# Patient Record
Sex: Female | Born: 1960 | Race: Black or African American | Hispanic: No | Smoking: Never smoker
Health system: Southern US, Community
[De-identification: ages and names within clinical notes are randomized; demographics above are authoritative.]

## PROBLEM LIST (undated history)

## (undated) DIAGNOSIS — E785 Hyperlipidemia, unspecified: Secondary | ICD-10-CM

## (undated) DIAGNOSIS — I480 Paroxysmal atrial fibrillation: Secondary | ICD-10-CM

## (undated) DIAGNOSIS — I10 Essential (primary) hypertension: Secondary | ICD-10-CM

## (undated) DIAGNOSIS — D219 Benign neoplasm of connective and other soft tissue, unspecified: Secondary | ICD-10-CM

## (undated) DIAGNOSIS — D649 Anemia, unspecified: Secondary | ICD-10-CM

## (undated) DIAGNOSIS — I499 Cardiac arrhythmia, unspecified: Secondary | ICD-10-CM

## (undated) HISTORY — DX: Paroxysmal atrial fibrillation: I48.0

## (undated) HISTORY — PX: MYOMECTOMY: SHX85

## (undated) HISTORY — PX: BREAST EXCISIONAL BIOPSY: SUR124

## (undated) HISTORY — DX: Anemia, unspecified: D64.9

---

## 1997-09-13 ENCOUNTER — Emergency Department (HOSPITAL_COMMUNITY): Admission: EM | Admit: 1997-09-13 | Discharge: 1997-09-13 | Payer: Self-pay | Admitting: Emergency Medicine

## 2000-09-02 ENCOUNTER — Other Ambulatory Visit: Admission: RE | Admit: 2000-09-02 | Discharge: 2000-09-02 | Payer: Self-pay | Admitting: Family Medicine

## 2000-09-02 ENCOUNTER — Encounter: Payer: Self-pay | Admitting: General Surgery

## 2000-09-02 ENCOUNTER — Ambulatory Visit (HOSPITAL_COMMUNITY): Admission: RE | Admit: 2000-09-02 | Discharge: 2000-09-02 | Payer: Self-pay | Admitting: Pediatrics

## 2000-09-08 ENCOUNTER — Encounter: Payer: Self-pay | Admitting: General Surgery

## 2000-09-08 ENCOUNTER — Ambulatory Visit (HOSPITAL_COMMUNITY): Admission: RE | Admit: 2000-09-08 | Discharge: 2000-09-08 | Payer: Self-pay | Admitting: Pediatrics

## 2001-05-25 ENCOUNTER — Encounter: Payer: Self-pay | Admitting: Family Medicine

## 2001-05-25 ENCOUNTER — Ambulatory Visit (HOSPITAL_COMMUNITY): Admission: RE | Admit: 2001-05-25 | Discharge: 2001-05-25 | Payer: Self-pay | Admitting: Family Medicine

## 2001-09-12 ENCOUNTER — Encounter: Payer: Self-pay | Admitting: General Surgery

## 2001-09-12 ENCOUNTER — Ambulatory Visit (HOSPITAL_COMMUNITY): Admission: RE | Admit: 2001-09-12 | Discharge: 2001-09-12 | Payer: Self-pay | Admitting: General Surgery

## 2002-04-29 ENCOUNTER — Emergency Department (HOSPITAL_COMMUNITY): Admission: EM | Admit: 2002-04-29 | Discharge: 2002-04-29 | Payer: Self-pay | Admitting: Emergency Medicine

## 2002-06-02 ENCOUNTER — Encounter: Payer: Self-pay | Admitting: Family Medicine

## 2002-06-02 ENCOUNTER — Ambulatory Visit (HOSPITAL_COMMUNITY): Admission: RE | Admit: 2002-06-02 | Discharge: 2002-06-02 | Payer: Self-pay | Admitting: Family Medicine

## 2002-06-07 ENCOUNTER — Ambulatory Visit (HOSPITAL_COMMUNITY): Admission: RE | Admit: 2002-06-07 | Discharge: 2002-06-07 | Payer: Self-pay | Admitting: Internal Medicine

## 2002-06-25 ENCOUNTER — Emergency Department (HOSPITAL_COMMUNITY): Admission: EM | Admit: 2002-06-25 | Discharge: 2002-06-25 | Payer: Self-pay | Admitting: Emergency Medicine

## 2002-10-18 ENCOUNTER — Encounter: Payer: Self-pay | Admitting: General Surgery

## 2002-10-18 ENCOUNTER — Ambulatory Visit (HOSPITAL_COMMUNITY): Admission: RE | Admit: 2002-10-18 | Discharge: 2002-10-18 | Payer: Self-pay | Admitting: General Surgery

## 2002-11-03 ENCOUNTER — Encounter: Payer: Self-pay | Admitting: General Surgery

## 2002-11-03 ENCOUNTER — Ambulatory Visit (HOSPITAL_COMMUNITY): Admission: RE | Admit: 2002-11-03 | Discharge: 2002-11-03 | Payer: Self-pay | Admitting: General Surgery

## 2002-12-05 ENCOUNTER — Encounter: Payer: Self-pay | Admitting: Family Medicine

## 2002-12-05 ENCOUNTER — Ambulatory Visit (HOSPITAL_COMMUNITY): Admission: RE | Admit: 2002-12-05 | Discharge: 2002-12-05 | Payer: Self-pay | Admitting: Family Medicine

## 2004-02-20 ENCOUNTER — Ambulatory Visit: Payer: Self-pay | Admitting: Family Medicine

## 2004-02-22 ENCOUNTER — Ambulatory Visit (HOSPITAL_COMMUNITY): Admission: RE | Admit: 2004-02-22 | Discharge: 2004-02-22 | Payer: Self-pay | Admitting: General Surgery

## 2005-02-24 ENCOUNTER — Ambulatory Visit (HOSPITAL_COMMUNITY): Admission: RE | Admit: 2005-02-24 | Discharge: 2005-02-24 | Payer: Self-pay | Admitting: General Surgery

## 2006-01-18 ENCOUNTER — Other Ambulatory Visit: Admission: RE | Admit: 2006-01-18 | Discharge: 2006-01-18 | Payer: Self-pay | Admitting: Family Medicine

## 2006-01-18 ENCOUNTER — Ambulatory Visit: Payer: Self-pay | Admitting: Family Medicine

## 2006-03-08 ENCOUNTER — Ambulatory Visit (HOSPITAL_COMMUNITY): Admission: RE | Admit: 2006-03-08 | Discharge: 2006-03-08 | Payer: Self-pay | Admitting: General Surgery

## 2006-04-14 ENCOUNTER — Ambulatory Visit (HOSPITAL_COMMUNITY): Admission: RE | Admit: 2006-04-14 | Discharge: 2006-04-14 | Payer: Self-pay | Admitting: General Surgery

## 2006-04-14 LAB — HM MAMMOGRAPHY: HM Mammogram: ABNORMAL

## 2006-05-21 ENCOUNTER — Encounter: Payer: Self-pay | Admitting: Family Medicine

## 2006-05-21 LAB — CONVERTED CEMR LAB
Eosinophils Absolute: 0.1 10*3/uL (ref 0.0–0.7)
Ferritin: 8 ng/mL — ABNORMAL LOW (ref 10–291)
Folate: >20 ng/mL
HCT: 38.2 % (ref 36.0–46.0)
Hemoglobin: 12.1 g/dL (ref 12.0–15.0)
Lymphs Abs: 1.8 10*3/uL (ref 0.7–3.3)
MCHC: 31.7 g/dL (ref 30.0–36.0)
MCV: 68.3 fL — ABNORMAL LOW (ref 78.0–100.0)
Monocytes Relative: 8 % (ref 3–11)
Retic Count, Absolute: 50.3 (ref 19.0–186.0)
Retic Ct Pct: 0.9 % (ref 0.4–3.1)
Vitamin B-12: 373 pg/mL (ref 211–911)
WBC: 6.6 10*3/uL (ref 4.0–10.5)

## 2007-01-25 ENCOUNTER — Ambulatory Visit: Payer: Self-pay | Admitting: Family Medicine

## 2007-01-25 ENCOUNTER — Encounter: Payer: Self-pay | Admitting: Family Medicine

## 2007-01-25 ENCOUNTER — Other Ambulatory Visit: Admission: RE | Admit: 2007-01-25 | Discharge: 2007-01-25 | Payer: Self-pay | Admitting: Family Medicine

## 2007-01-27 ENCOUNTER — Ambulatory Visit (HOSPITAL_COMMUNITY): Admission: RE | Admit: 2007-01-27 | Discharge: 2007-01-27 | Payer: Self-pay | Admitting: Family Medicine

## 2007-01-29 ENCOUNTER — Encounter: Payer: Self-pay | Admitting: Family Medicine

## 2007-01-29 LAB — CONVERTED CEMR LAB: Retic Ct Pct: 0.6 %

## 2007-05-23 ENCOUNTER — Ambulatory Visit (HOSPITAL_COMMUNITY): Admission: RE | Admit: 2007-05-23 | Discharge: 2007-05-23 | Payer: Self-pay | Admitting: General Surgery

## 2008-02-02 ENCOUNTER — Ambulatory Visit: Payer: Self-pay | Admitting: Family Medicine

## 2008-02-02 ENCOUNTER — Other Ambulatory Visit: Admission: RE | Admit: 2008-02-02 | Discharge: 2008-02-02 | Payer: Self-pay | Admitting: Family Medicine

## 2008-02-02 ENCOUNTER — Encounter: Payer: Self-pay | Admitting: Family Medicine

## 2008-02-02 ENCOUNTER — Telehealth (INDEPENDENT_AMBULATORY_CARE_PROVIDER_SITE_OTHER): Payer: Self-pay | Admitting: *Deleted

## 2008-02-02 DIAGNOSIS — D509 Iron deficiency anemia, unspecified: Secondary | ICD-10-CM

## 2008-02-02 DIAGNOSIS — E785 Hyperlipidemia, unspecified: Secondary | ICD-10-CM

## 2008-02-02 LAB — CONVERTED CEMR LAB: OCCULT 1: NEGATIVE

## 2008-02-04 ENCOUNTER — Encounter: Payer: Self-pay | Admitting: Family Medicine

## 2008-02-06 ENCOUNTER — Telehealth: Payer: Self-pay | Admitting: Family Medicine

## 2008-02-06 LAB — CONVERTED CEMR LAB: Retic Ct Pct: 1.2 % (ref 0.4–3.1)

## 2008-02-10 ENCOUNTER — Encounter: Payer: Self-pay | Admitting: Family Medicine

## 2008-03-02 ENCOUNTER — Ambulatory Visit (HOSPITAL_COMMUNITY): Admission: RE | Admit: 2008-03-02 | Discharge: 2008-03-02 | Payer: Self-pay | Admitting: Family Medicine

## 2008-03-02 ENCOUNTER — Ambulatory Visit: Payer: Self-pay | Admitting: Family Medicine

## 2008-03-02 DIAGNOSIS — N920 Excessive and frequent menstruation with regular cycle: Secondary | ICD-10-CM

## 2008-03-02 DIAGNOSIS — M79609 Pain in unspecified limb: Secondary | ICD-10-CM

## 2008-03-02 DIAGNOSIS — N939 Abnormal uterine and vaginal bleeding, unspecified: Secondary | ICD-10-CM

## 2008-03-02 DIAGNOSIS — N926 Irregular menstruation, unspecified: Secondary | ICD-10-CM | POA: Insufficient documentation

## 2008-03-02 DIAGNOSIS — R51 Headache: Secondary | ICD-10-CM

## 2008-03-02 DIAGNOSIS — R519 Headache, unspecified: Secondary | ICD-10-CM | POA: Insufficient documentation

## 2008-03-05 DIAGNOSIS — R05 Cough: Secondary | ICD-10-CM

## 2008-03-05 DIAGNOSIS — F329 Major depressive disorder, single episode, unspecified: Secondary | ICD-10-CM

## 2008-03-07 ENCOUNTER — Encounter (HOSPITAL_COMMUNITY): Admission: RE | Admit: 2008-03-07 | Discharge: 2008-04-06 | Payer: Self-pay | Admitting: Oncology

## 2008-03-07 ENCOUNTER — Encounter: Payer: Self-pay | Admitting: Family Medicine

## 2008-03-07 ENCOUNTER — Ambulatory Visit (HOSPITAL_COMMUNITY): Payer: Self-pay | Admitting: Oncology

## 2008-03-12 ENCOUNTER — Ambulatory Visit (HOSPITAL_COMMUNITY): Admission: RE | Admit: 2008-03-12 | Discharge: 2008-03-12 | Payer: Self-pay | Admitting: Family Medicine

## 2008-03-26 ENCOUNTER — Encounter: Payer: Self-pay | Admitting: Family Medicine

## 2008-06-06 ENCOUNTER — Ambulatory Visit: Payer: Self-pay | Admitting: Family Medicine

## 2008-06-29 ENCOUNTER — Ambulatory Visit (HOSPITAL_COMMUNITY): Admission: RE | Admit: 2008-06-29 | Discharge: 2008-06-29 | Payer: Self-pay | Admitting: General Surgery

## 2009-03-04 ENCOUNTER — Ambulatory Visit: Payer: Self-pay | Admitting: Family Medicine

## 2009-03-04 ENCOUNTER — Other Ambulatory Visit: Admission: RE | Admit: 2009-03-04 | Discharge: 2009-03-04 | Payer: Self-pay | Admitting: Family Medicine

## 2009-03-04 DIAGNOSIS — D259 Leiomyoma of uterus, unspecified: Secondary | ICD-10-CM | POA: Insufficient documentation

## 2009-03-04 HISTORY — DX: Leiomyoma of uterus, unspecified: D25.9

## 2009-03-05 ENCOUNTER — Encounter: Payer: Self-pay | Admitting: Family Medicine

## 2009-03-05 LAB — CONVERTED CEMR LAB
Basophils Absolute: 0 10*3/uL (ref 0.0–0.1)
Calcium: 9.4 mg/dL (ref 8.4–10.5)
Cholesterol: 264 mg/dL — ABNORMAL HIGH (ref 0–200)
Glucose, Bld: 87 mg/dL (ref 70–99)
HDL: 76 mg/dL (ref >39)
Hemoglobin: 11.2 g/dL — ABNORMAL LOW (ref 12.0–15.0)
LDL Cholesterol: 171 mg/dL — ABNORMAL HIGH (ref 0–99)
Lymphocytes Relative: 31 % (ref 12–46)
Lymphs Abs: 2.1 10*3/uL (ref 0.7–4.0)
MCV: 67.1 fL — ABNORMAL LOW (ref 78.0–100.0)
Monocytes Relative: 6 % (ref 3–12)
Neutro Abs: 4.3 10*3/uL (ref 1.7–7.7)
Potassium: 3.9 meq/L (ref 3.5–5.3)
RDW: 15.7 % — ABNORMAL HIGH (ref 11.5–15.5)
Sodium: 142 meq/L (ref 135–145)
VLDL: 17 mg/dL (ref 0–40)
WBC: 6.9 10*3/uL (ref 4.0–10.5)

## 2009-03-07 ENCOUNTER — Encounter: Payer: Self-pay | Admitting: Family Medicine

## 2009-03-09 ENCOUNTER — Encounter: Payer: Self-pay | Admitting: Family Medicine

## 2009-03-09 LAB — CONVERTED CEMR LAB: OCCULT 3: NEGATIVE

## 2009-03-12 ENCOUNTER — Encounter: Payer: Self-pay | Admitting: Family Medicine

## 2009-03-12 LAB — CONVERTED CEMR LAB: OCCULT 2: NEGATIVE

## 2009-03-13 ENCOUNTER — Encounter: Payer: Self-pay | Admitting: Family Medicine

## 2009-05-02 ENCOUNTER — Ambulatory Visit (HOSPITAL_COMMUNITY): Admission: RE | Admit: 2009-05-02 | Discharge: 2009-05-02 | Payer: Self-pay | Admitting: Family Medicine

## 2009-05-02 ENCOUNTER — Ambulatory Visit: Payer: Self-pay | Admitting: Family Medicine

## 2009-05-02 DIAGNOSIS — IMO0002 Reserved for concepts with insufficient information to code with codable children: Secondary | ICD-10-CM

## 2009-05-06 ENCOUNTER — Encounter: Payer: Self-pay | Admitting: Family Medicine

## 2009-05-08 ENCOUNTER — Telehealth: Payer: Self-pay | Admitting: Family Medicine

## 2009-07-05 ENCOUNTER — Ambulatory Visit (HOSPITAL_COMMUNITY): Admission: RE | Admit: 2009-07-05 | Discharge: 2009-07-05 | Payer: Self-pay | Admitting: General Surgery

## 2010-02-13 ENCOUNTER — Telehealth: Payer: Self-pay | Admitting: Family Medicine

## 2010-03-12 ENCOUNTER — Encounter: Payer: Self-pay | Admitting: Family Medicine

## 2010-03-12 ENCOUNTER — Telehealth: Payer: Self-pay | Admitting: Family Medicine

## 2010-03-12 ENCOUNTER — Ambulatory Visit: Payer: Self-pay | Admitting: Family Medicine

## 2010-03-14 ENCOUNTER — Encounter: Payer: Self-pay | Admitting: Family Medicine

## 2010-03-14 ENCOUNTER — Telehealth: Payer: Self-pay | Admitting: Family Medicine

## 2010-03-17 ENCOUNTER — Encounter: Payer: Self-pay | Admitting: Family Medicine

## 2010-03-17 LAB — CONVERTED CEMR LAB
Albumin: 4.7 g/dL (ref 3.5–5.2)
Alkaline Phosphatase: 71 units/L (ref 39–117)
CO2: 31 meq/L (ref 19–32)
Calcium: 10 mg/dL (ref 8.4–10.5)
Cholesterol: 278 mg/dL — ABNORMAL HIGH (ref 0–200)
HCT: 35.8 % — ABNORMAL LOW (ref 36.0–46.0)
HDL: 81 mg/dL (ref >39)
Indirect Bilirubin: 0.4 mg/dL (ref 0.0–0.9)
Iron: 94 ug/dL (ref 42–145)
LDL Cholesterol: 187 mg/dL — ABNORMAL HIGH (ref 0–99)
Lymphocytes Relative: 29 % (ref 12–46)
MCV: 65.6 fL — ABNORMAL LOW (ref 78.0–100.0)
Neutrophils Relative %: 65 % (ref 43–77)
Platelets: 323 10*3/uL (ref 150–400)
Potassium: 4.2 meq/L (ref 3.5–5.3)
RBC: 5.46 M/uL — ABNORMAL HIGH (ref 3.87–5.11)
RDW: 15.8 % — ABNORMAL HIGH (ref 11.5–15.5)
Sodium: 140 meq/L (ref 135–145)
Total Protein: 7.9 g/dL (ref 6.0–8.3)
VLDL: 10 mg/dL (ref 0–40)
WBC: 8 10*3/uL (ref 4.0–10.5)

## 2010-03-26 ENCOUNTER — Ambulatory Visit: Payer: Self-pay | Admitting: Family Medicine

## 2010-03-26 ENCOUNTER — Other Ambulatory Visit
Admission: RE | Admit: 2010-03-26 | Discharge: 2010-03-26 | Payer: Self-pay | Source: Home / Self Care | Admitting: Family Medicine

## 2010-03-27 LAB — HM PAP SMEAR

## 2010-03-27 LAB — HM MAMMOGRAPHY

## 2010-04-07 ENCOUNTER — Encounter: Payer: Self-pay | Admitting: Family Medicine

## 2010-04-08 ENCOUNTER — Encounter: Payer: Self-pay | Admitting: Family Medicine

## 2010-04-10 ENCOUNTER — Encounter: Payer: Self-pay | Admitting: Family Medicine

## 2010-04-13 ENCOUNTER — Encounter: Payer: Self-pay | Admitting: Family Medicine

## 2010-04-13 LAB — CONVERTED CEMR LAB: OCCULT 2: NEGATIVE

## 2010-04-15 ENCOUNTER — Encounter: Payer: Self-pay | Admitting: Family Medicine

## 2010-04-23 ENCOUNTER — Ambulatory Visit: Admit: 2010-04-23 | Payer: Self-pay | Admitting: Internal Medicine

## 2010-04-23 ENCOUNTER — Ambulatory Visit (HOSPITAL_COMMUNITY)
Admission: RE | Admit: 2010-04-23 | Discharge: 2010-04-23 | Payer: Self-pay | Source: Home / Self Care | Attending: Internal Medicine | Admitting: Internal Medicine

## 2010-04-29 NOTE — Letter (Signed)
Summary: Letter  Letter   Imported By: Lind Guest 05/09/2009 13:44:01  _____________________________________________________________________  External Attachment:    Type:   Image     Comment:   External Document

## 2010-04-29 NOTE — Progress Notes (Signed)
Summary: gas dr  Phone Note Call from Patient   Summary of Call:  patient that wants the refe. to a gast. dr  Initial call taken by: Lind Guest,  February 13, 2010 1:53 PM  Follow-up for Phone Call        pls call document her gI concerns so i can refer Follow-up by: Syliva Overman MD,  February 13, 2010 3:36 PM  Additional Follow-up for Phone Call Additional follow up Details #1::        needs gi referal for colonoscopy  Additional Follow-up by: Adella Hare LPN,  February 13, 2010 4:15 PM  New Problems: SPECIAL SCREENING FOR MALIGNANT NEOPLASMS COLON (ICD-V76.51)   Additional Follow-up for Phone Call Additional follow up Details #2::    advise I will reer her to dr Renae Fickle, i anm putting in referralbox , and pls refer Follow-up by: Syliva Overman MD,  February 13, 2010 4:42 PM  Additional Follow-up for Phone Call Additional follow up Details #3:: Details for Additional Follow-up Action Taken: faxed over the information they will call the patient with the appoinment Additional Follow-up by: Lind Guest,  February 14, 2010 8:12 AM  New Problems: SPECIAL SCREENING FOR MALIGNANT NEOPLASMS COLON (ICD-V76.51)

## 2010-04-29 NOTE — Progress Notes (Signed)
  Phone Note Call from Patient   Caller: Patient Summary of Call: started prednisone, having a really hard cough, is this a side effect Initial call taken by: Worthy Keeler LPN,  May 08, 2009 4:44 PM  Follow-up for Phone Call        no generally acts to suppress coughs, she can use oTC robitussin DM to suppress her cough Follow-up by: Syliva Overman MD,  May 08, 2009 4:59 PM  Additional Follow-up for Phone Call Additional follow up Details #1::        returned call, no answer Additional Follow-up by: Worthy Keeler LPN,  May 08, 2009 5:02 PM    Additional Follow-up for Phone Call Additional follow up Details #2::    called patient, left message Follow-up by: Worthy Keeler LPN,  May 09, 2009 8:42 AM   Appended Document:  patient aware

## 2010-04-29 NOTE — Assessment & Plan Note (Signed)
Summary: Cindy Weiss   Vital Signs:  Patient profile:   50 year old female Menstrual status:  irregular Height:      64 inches Weight:      153 pounds BMI:     26.36 O2 Sat:      98 % Pulse rate:   94 / minute Pulse rhythm:   regular Resp:     16 per minute BP sitting:   122 / 82 Cuff size:   regular  Vitals Entered By: Everitt Amber (May 02, 2009 2:14 PM)  Nutrition Counseling: Patient's BMI is greater than 25 and therefore counseled on weight management options. CC: Follow up chronic problems Is Patient Diabetic? No Pain Assessment Patient in pain? no        Primary Care Provider:  Syliva Overman MD  CC:  Follow up chronic problems.  History of Present Illness: Reports  that she has been doing fairly well. She is still getting over losing a near adoption, and is coping the best she can can. Denies recent fever or chills. Denies sinus pressure, nasal congestion , ear pain or sore throat. Denies chest congestion, or cough productive of sputum. Denies chest pain, palpitations, PND, orthopnea or leg swelling. Denies abdominal pain, nausea, vomitting, diarrhea or constipation. Denies change in bowel movements or bloody stool. Denies dysuria , frequency, incontinence or hesitancy.  Denies headaches, vertigo, seizures.  Denies  rash, lesions, or itch. she does suffer from mild depression and anxiety, primarily because of a marriage that she has worked over the years to keep, despite trying circumstances, she does state that she sees improvement in her spouse,and really is saddened by the loss of the child   Current Medications (verified): 1)  Iron 325 (65 Fe) Mg Tabs (Ferrous Sulfate) .... Take 1 Tablet By Mouth Once A Day  Allergies (verified): No Known Drug Allergies  Review of Systems      See HPI MS:  Complains of low back pain; 6 month h/o intermittent low back pain radiated to  to left  to left  post thigh to mid level, sharp shooting pains, no numbness or  weakness in lower ext. Endo:  Denies cold intolerance, excessive hunger, excessive thirst, excessive urination, heat intolerance, polyuria, and weight change. Heme:  Denies bleeding. Allergy:  Denies hives or rash and sneezing.  Physical Exam  General:  Well-developed,well-nourished,in no acute distress; alert,appropriate and cooperative throughout examination HEENT: No facial asymmetry,  EOMI, No sinus tenderness, TM's Clear, oropharynx  pink and moist.   Chest: Clear to auscultation bilaterally.  CVS: S1, S2, No murmurs, No S3.   Abd: Soft, Nontender.  MS: decreased  ROM spine,adequate in hips, shoulders and knees.  Ext: No edema.   CNS: CN 2-12 intact, power tone and sensation normal throughout.   Skin: Intact, no visible lesions or rashes.  Psych: Good eye contact, normal affect.  Memory intact, mildly anxious not depressed appearing.    Impression & Recommendations:  Problem # 1:  BACK PAIN WITH RADICULOPATHY (ICD-729.2) Assessment Deteriorated  Orders: Depo- Medrol 80mg  (J1040) Ketorolac-Toradol 15mg  (Z6109) Admin of Therapeutic Inj  intramuscular or subcutaneous (60454) Diagnostic X-Ray/Fluoroscopy (Diagnostic X-Ray/Flu)  Problem # 2:  FIBROIDS, UTERUS (ICD-218.9) Assessment: Unchanged  Problem # 3:  ABNORMAL VAGINAL BLEEDING (ICD-626.9) Assessment: Comment Only amennoreah x 2 months, pt likely perimenopausal, she is not sexually active, hence , conception is not possibl;e, i explained this to her.   Complete Medication List: 1)  Iron 325 (65 Fe) Mg Tabs (Ferrous  sulfate) .... Take 1 tablet by mouth once a day 2)  Ibuprofen 800 Mg Tabs (Ibuprofen) .... Take 1 tablet by mouth three times a day  Patient Instructions: 1)  Please schedule a follow-up appointment in 4 months. 2)  It is important that you exercise regularly at least 20 minutes 5 times a week. If you develop chest pain, have severe difficulty breathing, or feel very tired , stop exercising immediately and  seek medical attention. 3)  You need to lose weight. Consider a lower calorie diet and regular exercise.  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 Prescriptions: PREDNISONE (PAK) 5 MG TABS (PREDNISONE) Use as directed  #21 x 0   Entered and Authorized by:   Syliva Overman MD   Signed by:   Syliva Overman MD on 05/02/2009   Method used:   Electronically to        [facility] [address] [phone]* (retail)       [address], [place]  [address]       Ph: [phone]       Fax: [phone]   RxID:   [id number] IBUPROFEN 800 MG TABS (IBUPROFEN) Take 1 tablet by mouth three times a day  #30 x 0   Entered and Authorized by:   Syliva Overman MD   Signed by:   Syliva Overman MD on 05/02/2009   Method used:   Electronically to        [facility] [address] [phone]* (retail)       [address], [place]  [address]       Ph: [phone]       Fax: [phone]   RxID:   [id number]    Medication Administration  Injection # 1:    Medication: Depo- Medrol 80mg     Diagnosis: BACK PAIN WITH RADICULOPATHY (ICD-729.2)    Route: IM    Site: RUOQ gluteus    Exp Date: 12/2009    Lot #: [id number]    Mfr: [employer]acia    Comments: 80mg  given     Patient tolerated injection without complications    Given by: Everitt Amber (May 02, 2009 3:12 PM)  Injection # 2:    Medication: Ketorolac-Toradol 15mg     Diagnosis: BACK PAIN WITH RADICULOPATHY (ICD-729.2)    Route: IM    Site: LUOQ gluteus    Exp Date: 11/2010    Lot #: [id number]    Mfr: novaplus    Comments: 60mg  given     Patient tolerated injection without complications    Given by: Everitt Amber (May 02, 2009 3:12 PM)  Orders Added: 1)  Est. Patient Level IV [70623] 2)  Diagnostic X-Ray/Fluoroscopy [Diagnostic X-Ray/Flu] 3)  Depo- Medrol 80mg  [J1040] 4)  Ketorolac-Toradol 15mg  [J1885] 5)  Admin of Therapeutic Inj   intramuscular or subcutaneous [96372] 6)  Diagnostic X-Ray/Fluoroscopy [Diagnostic X-Ray/Flu]

## 2010-05-01 NOTE — Procedures (Signed)
Summary: lab add on  lab add on   Imported By: Luann Bullins 03/17/2010 10:25:30  _____________________________________________________________________  External Attachment:    Type:   Image     Comment:   External Document

## 2010-05-01 NOTE — Miscellaneous (Signed)
Summary: Orders Update  Clinical Lists Changes  Orders: Added new Test order of T-Hepatic Function (80076-22960) - Signed Added new Test order of T-Lipid Profile (80061-22930) - Signed 

## 2010-05-01 NOTE — Letter (Signed)
Summary: CHANGE IN MEDICINE  CHANGE IN MEDICINE   Imported By: Lind Guest 03/27/2010 16:43:36  _____________________________________________________________________  External Attachment:    Type:   Image     Comment:   External Document

## 2010-05-01 NOTE — Assessment & Plan Note (Signed)
Summary: phys   Allergies: No Known Drug Allergies   Complete Medication List: 1)  Multivitamin  .... One daily 2)  Oscal 500/200 D-3 500-200 Mg-unit Tabs (Calcium carbonate-vitamin d) .... Take 1 tablet by mouth three times a day 3)  Pravastatin Sodium 40 Mg Tabs (Pravastatin sodium) .... Two at bedtime Prescriptions: PRAVASTATIN SODIUM 40 MG TABS (PRAVASTATIN SODIUM) two at bedtime  #60 x 3   Entered and Authorized by:   Syliva Overman MD   Signed by:   Syliva Overman MD on 03/26/2010   Method used:   Printed then faxed to ...       [facility] [address] [phone]* (retail)       [address] [place], [place]  [address]       Ph: [phone]       Fax: [phone]   RxID:   [id number]     Ptr in today only to complete her physical exam for a pap, sh was bleeding heavily at last visit. Uterus enlgd, no adnexal masses appreciated. novag d/c , cervical or adnexal tendernesss. pap sent. rectal exam:guaic neg stool

## 2010-05-01 NOTE — Miscellaneous (Signed)
  Clinical Lists Changes  Observations: Added new observation of LAB RESULTS: 51201 8L 8/13 5030 05/14 Adella Hare LPN  April 15, 2010 1:58 PM  (04/15/2010 13:57) Added new observation of HEMOCCULT 2: negative (04/13/2010 13:58) Added new observation of HEMOCCULT 3: negative (04/10/2010 13:58) Added new observation of HEMOCCULT 1: negative (04/08/2010 13:58)     Laboratory Results  Date/Time Received: April 15, 2010 1:58 PM  Date/Time Reported: April 15, 2010 1:58 PM   Stool - Occult Blood Hemmoccult #1: negative Date: 04/08/2010 Hemoccult #2: negative Date: 04/10/2010 Hemoccult #3: negative Date: 04/13/2010 Comments: 51201 8L 8/13 5030 05/14 Adella Hare LPN  April 15, 2010 1:58 PM

## 2010-05-01 NOTE — Progress Notes (Signed)
Summary: lab results  Phone Note Call from Patient   Summary of Call: patient request you call her parents house and let them know if she isn't there about blood work.  [phone] She says you can tell her parents she said if you left it on her machine she might not get it until we have left for the day. Initial call taken by: Curtis Sites,  March 14, 2010 4:20 PM  Follow-up for Phone Call        lipid lowering agent is entered historicslly,pls fax after you spk with her Follow-up by: Syliva Overman MD,  March 16, 2010 11:42 PM  Additional Follow-up for Phone Call Additional follow up Details #1::        called patient, left message Additional Follow-up by: Adella Hare LPN,  March 17, 2010 1:28 PM    Additional Follow-up for Phone Call Additional follow up Details #2::    see appends Follow-up by: Adella Hare LPN,  March 17, 2010 3:16 PM  New/Updated Medications: LIPITOR 40 MG TABS (ATORVASTATIN CALCIUM) Take 1 tab by mouth at bedtime Prescriptions: LIPITOR 40 MG TABS (ATORVASTATIN CALCIUM) Take 1 tab by mouth at bedtime  #30 x 4   Entered by:   Syliva Overman MD   Authorized by:   Adella Hare LPN   Signed by:   Syliva Overman MD on 03/16/2010   Method used:   Historical   RxID:   [id number]

## 2010-05-01 NOTE — Progress Notes (Signed)
Summary: rx [facility]  Phone Note Call from Patient   Summary of Call: patient wants medication sent to [facility] pharmacy in town.  thanks Initial call taken by: Lind Guest,  March 12, 2010 5:20 PM  Follow-up for Phone Call        pls fax the scripts written at ov to the pharmacy she requested and change permanenetly in the computer Follow-up by: Syliva Overman MD,  March 12, 2010 5:38 PM

## 2010-05-01 NOTE — Assessment & Plan Note (Signed)
Summary: PHY   Vital Signs:  Patient profile:   50 year old female Menstrual status:  irregular Height:      64 inches Weight:      153.75 pounds BMI:     26.49 O2 Sat:      100 % on Room air Pulse rate:   80 / minute Pulse rhythm:   regular Resp:     16 per minute BP supine:   130 / 80  (right arm)  Vitals Entered By: Adella Hare LPN (March 12, 2010 4:34 PM)  Nutrition Counseling: Patient's BMI is greater than 25 and therefore counseled on weight management options.  O2 Flow:  Room air  Vision Screening:Left eye w/o correction: 20 / 25 Right Eye w/o correction: 20 / 30 Both eyes w/o correction:  20/ 20        Vision Entered By: Adella Hare LPN (March 12, 2010 4:35 PM)   Primary Care Provider:  Syliva Overman MD   History of Present Illness: Reports  that she has been doing well. Denies recent fever or chills. Denies sinus pressure, nasal congestion , ear pain or sore throat. Denies chest congestion, or cough productive of sputum. Denies chest pain, palpitations, PND, orthopnea or leg swelling. Denies abdominal pain, nausea, vomitting, diarrhea or constipation. Denies change in bowel movements or bloody stool. Denies dysuria , frequency, incontinence or hesitancy. Denies  joint pain, swelling, or reduced mobility. Denies headaches, vertigo, seizures. Denies depression, anxiety or insomnia. Denies  rash, lesions, or itch.     Current Medications (verified): 1)  None  Allergies (verified): No Known Drug Allergies  Review of Systems      See HPI Eyes:  Denies blurring, discharge, double vision, eye pain, and red eye. GU:  menses std today. Endo:  Denies cold intolerance, excessive hunger, excessive thirst, excessive urination, and heat intolerance. Heme:  Denies abnormal bruising and bleeding. Allergy:  Denies hives or rash and itching eyes.  Physical Exam  General:  Well-developed,well-nourished,in no acute distress; alert,appropriate and  cooperative throughout examination Head:  Normocephalic and atraumatic without obvious abnormalities. No apparent alopecia or balding. Ears:  External ear exam shows no significant lesions or deformities.  Otoscopic examination reveals clear canals, tympanic membranes are intact bilaterally without bulging, retraction, inflammation or discharge. Hearing is grossly normal bilaterally. Nose:  External nasal examination shows no deformity or inflammation. Nasal mucosa are pink and moist without lesions or exudates. Mouth:  Oral mucosa and oropharynx without lesions or exudates.  Teeth in good repair. Neck:  No deformities, masses, or tenderness noted. Chest Wall:  No deformities, masses, or tenderness noted. Breasts:  No mass, nodules, thickening, tenderness, bulging, retraction, inflamation, nipple discharge or skin changes noted.   Lungs:  Normal respiratory effort, chest expands symmetrically. Lungs are clear to auscultation, no crackles or wheezes. Heart:  Normal rate and regular rhythm. S1 and S2 normal without gallop, murmur, click, rub or other extra sounds. Abdomen:  Bowel sounds positive,abdomen soft and non-tender without masses, organomegaly or hernias noted. Rectal:  deferred due to heavy menses Genitalia:  heavy bleeding. Uterus enlarged approx 16 top 20 week size Msk:  No deformity or scoliosis noted of thoracic or lumbar spine.   Pulses:  R and L carotid,radial,femoral,dorsalis pedis and posterior tibial pulses are full and equal bilaterally Extremities:  No clubbing, cyanosis, edema, or deformity noted with normal full range of motion of all joints.   Neurologic:  No cranial nerve deficits noted. Station and gait are normal. Plantar  reflexes are down-going bilaterally. DTRs are symmetrical throughout. Sensory, motor and coordinative functions appear intact.   Impression & Recommendations:  Problem # 1:  FIBROIDS, UTERUS (ICD-218.9) Assessment Unchanged  Problem # 2:  PHYSICAL  EXAMINATION (ICD-V70.0) Assessment: Comment Only pt to return for rectaland pap when not bleeding  Complete Medication List: 1)  Multivitamin  .... One daily 2)  Oscal 500/200 D-3 500-200 Mg-unit Tabs (Calcium carbonate-vitamin d) .... Take 1 tablet by mouth three times a day  Other Orders: T-Basic Metabolic Panel [phone]) T-Lipid Profile [phone]) T-CBC w/Diff [phone]) T-TSH [phone]) Augusto Gamble [phone]) T-Vitamin D (25-Hydroxy) [phone]) T-Vitamin B12 [phone]) Radiology Referral (Radiology)  Patient Instructions: 1)  Follow up appointment in 5.89months 2)  It is important that you exercise regularly at least 30 minutes 5 times a week. If you develop chest pain, have severe difficulty breathing, or feel very tired , stop exercising immediately and seek medical attention. 3)  You need to lose weight. Consider a lower calorie diet and regular exercise.  4)  BMP prior to visit, ICD-9: 5)  Lipid Panel prior to visit, ICD-9: 6)  TSH prior to visit, ICD-9: 7)  CBC w/ Diff prior to visit, ICD-9:, b12 and iron levels 8)  vit D Prescriptions: OSCAL 500/200 D-3 500-200 MG-UNIT TABS (CALCIUM CARBONATE-VITAMIN D) Take 1 tablet by mouth three times a day  #90 x 11   Entered by:   Adella Hare LPN   Authorized by:   Syliva Overman MD   Signed by:   Adella Hare LPN on 38/75/6433   Method used:   Electronically to        [facility] [address] [phone]* (retail)       [address] [place], [place]  [address]       Ph: [phone]       Fax: [phone]   RxID:   [id number] VVOHY 500/200 D-3 500-200 MG-UNIT TABS (CALCIUM CARBONATE-VITAMIN D) Take 1 tablet by mouth three times a day  #90 x 11   Entered and Authorized by:   Syliva Overman MD   Signed by:   Syliva Overman MD on 03/12/2010   Method used:   Electronically to        [facility]  [address]* (retail)       [address]       [place], [place]  [address]        Ph: [phone]       Fax: [phone]   RxID:   [id number]    Orders Added: 1)  Est. Patient 40-64 years [99396] 2)  T-Basic Metabolic Panel [phone] 3)  T-Lipid Profile [52778-24235] 4)  T-CBC w/Diff [36144-31540] 5)  T-TSH [08676-19509] 6)  T-Iron [32671-24580] 7)  T-Vitamin D (25-Hydroxy) [99833-82505] 8)  T-Vitamin B12 [39767-34193] 9)  Radiology Referral [Radiology]  Appended Document: PHY

## 2010-05-01 NOTE — Letter (Signed)
Summary: Pap Smear, Normal Letter, [facility]  [address]   [place], [place] [address]   Phone: [phone]  Fax: [phone]          April 07, 2010    Dear: Cindy Weiss    I am pleased to notify you that your PAP smear was normal.  You will need your next PAP smear in:     ____ 3 Months    ____ 6 Months    ____ 12 Months    Please call the office at our office number above, to schedule your next appointment.    Sincerely,     [facility]

## 2010-05-03 NOTE — Op Note (Signed)
  NAMEMAHALA, Cindy Weiss               ACCOUNT NO.:  [id number]  MEDICAL RECORD NO.:  [id number]          PATIENT TYPE:  AMB  LOCATION:  DAY                           FACILITY:  [facility]  PHYSICIAN:  Lionel December, M.D.    DATE OF BIRTH:  06-14-1960  DATE OF PROCEDURE:  04/23/2010 DATE OF DISCHARGE:                              OPERATIVE REPORT   PROCEDURE:  Colonoscopy.  INDICATION:  Cindy Weiss is a 50 year old African American female who is undergoing average risk screening colonoscopy.  Procedure risks were reviewed with the patient and informed consent was obtained.  MEDICATIONS FOR CONSCIOUS SEDATION: 1. Demerol 50 mg IV. 2. Versed 8 mg IV.  FINDINGS:  Procedure performed in endoscopy suite.  The patient's vital signs and O2 sats were monitored during the procedure and remained stable.  The patient was placed in left lateral recumbent position and rectal examination performed.  No abnormality noted on external or digital exam.  Pentax videoscope was placed through rectum and advanced under vision into sigmoid colon beyond.  Preparation was excellent. Somewhat redundant sigmoid colon.  Scope was advanced using the loop. Scope was passed into cecum which was identified by appendiceal orifice and ileocecal valve.  Pictures were taken for the record.  As the scope was withdrawn, colonic mucosa was carefully examined.  There was a 4-mm polyp at distal transverse colon which was ablated via cold biopsy. Mucosa and rest of the colon was normal.  Rectal mucosa similarly was normal.  Scope was retroflexed to examine anorectal junction which was unremarkable.  Endoscope was straightened and withdrawn.  Withdrawal time was 14 minutes.  The patient tolerated the procedure well.  FINAL DIAGNOSES: 1. Examination performed to cecum. 2. Small polyp ablated via cold biopsy from distal transverse colon.  RECOMMENDATIONS: 1. Standard instructions given. 2. I will be contacting the patient with  results of biopsy and further     recommendations.     Lionel December, M.D.     NR/MEDQ  D:  04/23/2010  T:  04/23/2010  Job:  [id number]  cc:   Milus Mallick. Lodema Hong, M.D. Fax: [phone]  Electronically Signed by Lionel December M.D. on 05/03/2010 01:41:10 PM

## 2010-05-20 ENCOUNTER — Telehealth: Payer: Self-pay | Admitting: Family Medicine

## 2010-05-21 DIAGNOSIS — IMO0001 Reserved for inherently not codable concepts without codable children: Secondary | ICD-10-CM | POA: Insufficient documentation

## 2010-05-22 ENCOUNTER — Encounter: Payer: Self-pay | Admitting: Family Medicine

## 2010-05-23 ENCOUNTER — Encounter: Payer: Self-pay | Admitting: Family Medicine

## 2010-05-23 LAB — CONVERTED CEMR LAB
Bilirubin, Direct: 0.1 mg/dL (ref 0.0–0.3)
Total Bilirubin: 0.5 mg/dL (ref 0.3–1.2)
Total CK: 179 units/L — ABNORMAL HIGH (ref 7–177)
Total Protein: 7.6 g/dL (ref 6.0–8.3)

## 2010-05-27 NOTE — Miscellaneous (Signed)
Summary: Adverse reaction  Clinical Lists Changes  Medications: Removed medication of PRAVASTATIN SODIUM 40 MG TABS (PRAVASTATIN SODIUM) two at bedtime Allergies: Added new allergy or adverse reaction of * PRAVASTATIN Observations: Added new observation of NKA: F (05/22/2010 14:28)

## 2010-05-27 NOTE — Progress Notes (Signed)
Summary: meds  Phone Note Call from Patient   Summary of Call: needs to speak with nurse about chol medicine. pt states she has been hurting and thinks maybe its the meds. [phone] Initial call taken by: Rudene Anda,  May 20, 2010 3:06 PM  Follow-up for Phone Call        called patient back, left message with family members to call back  Follow-up by: Everitt Amber LPN,  May 20, 2010 4:04 PM  Additional Follow-up for Phone Call Additional follow up Details #1::        Dr. Lodema Hong spoke with patient regarding this yesterday. See additional message  Additional Follow-up by: Everitt Amber LPN,  May 21, 2010 9:50 AM

## 2010-05-27 NOTE — Letter (Signed)
Summary: d/c medicine  d/c medicine   Imported By: Lind Guest 05/23/2010 14:46:06  _____________________________________________________________________  External Attachment:    Type:   Image     Comment:   External Document

## 2010-05-27 NOTE — Progress Notes (Signed)
  Phone Note Call from Patient   Caller: Patient Summary of Call: c/o muscle aches and hai loss since starting pravastatin, has 6 of 60 tabs left, i advisedher to stop taking the praVASTATIN, AND LABS WOULD BE ORDERED Initial call taken by: Syliva Overman MD,  May 20, 2010 9:14 PM  Follow-up for Phone Call        pls order hepatic panel,ckmb and cpk  dx muscle aches and abd pain on statin, looking for drug toxicity, pls fax to lab Follow-up by: Syliva Overman MD,  May 20, 2010 9:16 PM     Appended Document: Orders Update    Clinical Lists Changes  Problems: Added new problem of MUSCLE PAIN (ICD-729.1) Orders: Added new Test order of T-Hepatic Function [id number]) - Signed Added new Test order of T-[facility]) (82550-CKMB) - Signed Added new Test order of TLB-CK Total Only(Creatine Kinase/CPK) (82550-CK) - Signed      Appended Document:  called patient left message  Appended Document:  patient aware

## 2010-06-19 ENCOUNTER — Other Ambulatory Visit (HOSPITAL_COMMUNITY): Payer: Self-pay | Admitting: General Surgery

## 2010-06-19 DIAGNOSIS — Z139 Encounter for screening, unspecified: Secondary | ICD-10-CM

## 2010-07-07 ENCOUNTER — Ambulatory Visit (HOSPITAL_COMMUNITY)
Admission: RE | Admit: 2010-07-07 | Discharge: 2010-07-07 | Disposition: A | Discharge status: Home / Self Care | Payer: PRIVATE HEALTH INSURANCE | Source: Ambulatory Visit | Attending: General Surgery | Admitting: General Surgery

## 2010-07-07 DIAGNOSIS — Z1231 Encounter for screening mammogram for malignant neoplasm of breast: Secondary | ICD-10-CM | POA: Insufficient documentation

## 2010-07-07 DIAGNOSIS — Z139 Encounter for screening, unspecified: Secondary | ICD-10-CM

## 2010-08-15 NOTE — Procedures (Signed)
   NAMEVIKKIE, Cindy Weiss                         ACCOUNT NO.:  [id number]   MEDICAL RECORD NO.:  [id number]                   PATIENT TYPE:  OUT   LOCATION:  RAD                                  FACILITY:  [facility]   PHYSICIAN:  Vida Roller, M.D.                DATE OF BIRTH:  July 22, 1960   DATE OF PROCEDURE:  06/07/2002  DATE OF DISCHARGE:                                  ECHOCARDIOGRAM   TAPE NUMBER:  [id number]   TAPE COUNT:  [id number]   REASON FOR CONSULTATION:  Palpitations.   QUALITY OF THE STUDY:  Adequate.   MEASUREMENTS M-MODE:  1. The aorta is 27 mm.  2. The left atrium is 34 mm.  3. The septum is 9 mm.  4. The posterior wall is 8 mm.  5. The left ventricular diastolic dimension is 41 mm.  6. The left ventricular systolic dimension is 32 mm.   2-D AND DOPPLER IMAGING:  1. The left ventricle is normal size with normal systolic and diastolic     function.  No wall motion abnormalities are seen.  2. The right ventricle is normal size with normal systolic function.  3. The atria are both normal size.  There is no evidence of an atrial septal     defect.  4. The aortic valve is trileaflet, tricommisural with no evidence of     stenosis of stenosis or regurgitation.  5. The mitral valve shows morphologically unremarkable with trace     insufficiency.  No stenosis is seen.  6. The tricuspid valve is morphologically unremarkable with a trace     insufficiency.  No stenosis is seen.  7. The pulmonic valve is morphologically unremarkable with no stenosis or     regurgitation.  8. Pericardial structures are normal.  9. The ascending aorta is not well seen.  10.      There are no cardiac masses.                                               Vida Roller, M.D.    JH/MEDQ  D:  06/07/2002  T:  06/08/2002  Job:  [id number]

## 2010-08-15 NOTE — Procedures (Signed)
   Cindy Weiss, Cindy Weiss                         ACCOUNT NO.:  [id number]   MEDICAL RECORD NO.:  [id number]                   PATIENT TYPE:  OUT   LOCATION:  RAD                                  FACILITY:  [facility]   PHYSICIAN:  Vida Roller, M.D.                DATE OF BIRTH:  1961-02-06   DATE OF PROCEDURE:  06/09/2002  DATE OF DISCHARGE:                                EKG INTERPRETATION   PROCEDURES:  Ambulatory electrocardiogram evaluation.   REASON FOR CONSULTATION:  Palpitations.   DETAILS OF THE PROCEDURE:  The patient wore an ambulatory EKG monitor over a  24-hour period from 11 to 09 June 2002 during which time she performed her  routine daily activities of life.  She maintained a diary which annotated  three separate events of chest discomfort and one episode of shortness of  breath associated with the chest discomfort, all of which were short lived.   INTERPRETATION:  There is sinus rhythm throughout with heart rate  variability from 69 beats per minute to 149 beats per minute.  There was  rare ventricular ectopy with a single episode of a ventricular couplet.  There was minimal supraventricular tachycardia with an occasional PAC.  The  ST segment analysis revealed no significant ischemic changes.  Overall, the  three episodes of chest pain were associated with normal sinus rhythm  without ST-T wave changes.  There was no evidence of atrial fibrillation,  ventricular fibrillation, atrial tachycardia, ventricular tachycardia,  asystole, or significant heart block.   Interpretation:  Nondiagnostic ambulatory EKG with no evidence of arrhythmic  cause for the palpitations or the chest discomfort.   RECOMMENDATIONS:  Recommend an ischemic evaluation beyond this be performed  with an exercise stress test.                                               Vida Roller, M.D.    JH/MEDQ  D:  06/09/2002  T:  06/10/2002  Job:  [id number]

## 2010-09-04 ENCOUNTER — Encounter: Payer: Self-pay | Admitting: Family Medicine

## 2010-09-08 ENCOUNTER — Encounter: Payer: Self-pay | Admitting: Family Medicine

## 2010-09-08 ENCOUNTER — Ambulatory Visit (INDEPENDENT_AMBULATORY_CARE_PROVIDER_SITE_OTHER): Payer: PRIVATE HEALTH INSURANCE | Admitting: Family Medicine

## 2010-09-08 VITALS — BP 140/98 | HR 81 | Resp 16 | Ht 64.0 in | Wt 160.0 lb

## 2010-09-08 DIAGNOSIS — E785 Hyperlipidemia, unspecified: Secondary | ICD-10-CM

## 2010-09-08 DIAGNOSIS — E663 Overweight: Secondary | ICD-10-CM

## 2010-09-08 DIAGNOSIS — F329 Major depressive disorder, single episode, unspecified: Secondary | ICD-10-CM

## 2010-09-08 NOTE — Patient Instructions (Addendum)
F/u in 4 months  I will  get info for help with adoption.  It is important that you exercise regularly at least 30 minutes 5 times a week. If you develop chest pain, have severe difficulty breathing, or feel very tired, stop exercising immediately and seek medical attention   A healthy diet is rich in fruit, vegetables and whole grains. Poultry fish, nuts and beans are a healthy choice for protein rather then red meat. A low sodium diet and drinking 64 ounces of water daily is generally recommended. Oils and sweet should be limited. Carbohydrates especially for those who are diabetic or overweight, should be limited to 34-45 gram per meal. It is important to eat on a regular schedule, at least 3 times daily. Snacks should be primarily fruits, vegetables or nuts.      Fasting lipid asap

## 2010-09-08 NOTE — Progress Notes (Signed)
  Subjective:    Patient ID: Cindy Weiss, female    DOB: 1960/04/03, 50 y.o.   MRN: [id number]  HPI  Pt has been more stressed ever since her spouse has been superimposing photos of the child they nearly adopted, she has been having eating binges  With regard to specific foods, and as a result has gained weight. She is distressed about this, and wants to lose weight. States they are also now willing to adopt. She denies overt depression, but does report a lot of anxiety, she denies suicidal or homicidal thoughts, social withdrawal or excessive crying  Review of Systems Denies recent fever or chills. Denies sinus pressure, nasal congestion, ear pain or sore throat. Denies chest congestion, productive cough or wheezing. Denies chest pains, palpitations, paroxysmal nocturnal dyspnea, orthopnea and leg swelling Denies abdominal pain, nausea, vomiting,diarrhea or constipation.  Denies rectal bleeding or change in bowel movement. Denies dysuria, frequency, hesitancy or incontinence. Denies joint pain, swelling and limitation in mobility. Denies headaches, seizure, numbness, or tingling.  Denies skin break down or rash.        Objective:   Physical Exam Patient alert and oriented and in no Cardiopulmonary distress.  HEENT: No facial asymmetry, EOMI, no sinus tenderness, TM's clear, Oropharynx pink and moist.  Neck supple no adenopathy.  Chest: Clear to auscultation bilaterally.  CVS: S1, S2 no murmurs, no S3.  ABD: Soft non tender. Bowel sounds normal.  Ext: No edema  MS: Adequate ROM spine, shoulders, hips and knees.  Skin: Intact, no ulcerations or rash noted.  Psych: Good eye contact, normal affect. Memory intact not anxious or depressed appearing.  CNS: CN 2-12 intact, power, tone and sensation normal throughout.        Assessment & Plan:

## 2010-09-13 ENCOUNTER — Other Ambulatory Visit: Payer: Self-pay | Admitting: Family Medicine

## 2010-09-13 LAB — LIPID PANEL: Cholesterol: 277 mg/dL — ABNORMAL HIGH (ref 0–200)

## 2010-09-19 NOTE — Progress Notes (Signed)
Mailed letter °

## 2010-09-21 DIAGNOSIS — E663 Overweight: Secondary | ICD-10-CM | POA: Insufficient documentation

## 2010-09-21 NOTE — Assessment & Plan Note (Signed)
Low fat diet  Discusssed, pt had experienced some muscle pain with statin in the past

## 2010-09-21 NOTE — Assessment & Plan Note (Signed)
Deteriorated, regular exercise and more control with diet discussed and encouraged

## 2010-09-21 NOTE — Assessment & Plan Note (Signed)
Deteriorated, anxiety about loss of child whoo she had in foster care and the fact that her spouse will not let go, not suicidal or homicidal, but manifest as overeating

## 2010-09-26 ENCOUNTER — Telehealth: Payer: Self-pay | Admitting: *Deleted

## 2010-09-28 NOTE — Telephone Encounter (Signed)
pls explain the body produces cholesterol and some people genetically have high cholesteol as a result, since her diet is good  , this is probably true for her.  She can use red rice yeast which is at health [employer] store take 1 to 2 daily and rept labs in 4 months fasting

## 2010-09-30 NOTE — Telephone Encounter (Signed)
Called patient,busy signal.

## 2010-10-02 NOTE — Telephone Encounter (Signed)
CALLED PATIENT, NO ANSWER °

## 2010-10-03 NOTE — Telephone Encounter (Signed)
Called patient, left message.

## 2010-12-05 ENCOUNTER — Encounter: Payer: Self-pay | Admitting: Family Medicine

## 2010-12-08 ENCOUNTER — Ambulatory Visit (INDEPENDENT_AMBULATORY_CARE_PROVIDER_SITE_OTHER): Payer: PRIVATE HEALTH INSURANCE | Admitting: Family Medicine

## 2010-12-08 ENCOUNTER — Encounter: Payer: Self-pay | Admitting: Family Medicine

## 2010-12-08 VITALS — BP 150/100 | HR 77 | Resp 16 | Ht 64.0 in | Wt 154.1 lb

## 2010-12-08 DIAGNOSIS — E785 Hyperlipidemia, unspecified: Secondary | ICD-10-CM

## 2010-12-08 DIAGNOSIS — I10 Essential (primary) hypertension: Secondary | ICD-10-CM

## 2010-12-08 DIAGNOSIS — E663 Overweight: Secondary | ICD-10-CM

## 2010-12-08 MED ORDER — AMLODIPINE BESYLATE 2.5 MG PO TABS: 2.5000 mg | ORAL_TABLET | Freq: Every day | ORAL | Status: DC

## 2010-12-08 NOTE — Patient Instructions (Addendum)
F/U in 6 weeks.  New medication for  blood pressure which is high    Pls follow a low salt diet, exercise daily for approx 30 minutes, and attempt to lose 3 to 6 pounds, and DO NOT WORRY.  Pls follow a low fat diet.  LABWORK  NEEDS TO BE DONE BETWEEN 3 TO 7 DAYS BEFORE YOUR NEXT SCEDULED  VISIT.  THIS WILL IMPROVE THE QUALITY OF YOUR CARE.  Fasting lipid and chem 7.  Have a wonderful trip!

## 2010-12-09 NOTE — Assessment & Plan Note (Signed)
Statin intolerant, muscle aches with elevated muscle enzyme, low fat diet only at this time, rept labs in 2 months

## 2010-12-09 NOTE — Assessment & Plan Note (Signed)
New diagnosis, educated re DASH diet, low sodium intake and the need for regular exercise. Pt to start medication also

## 2010-12-09 NOTE — Progress Notes (Signed)
  Subjective:    Patient ID: Cindy Weiss, female    DOB: Mar 09, 1961, 50 y.o.   MRN: [id number]  HPI The PT is here for follow up and re-evaluation of chronic medical conditions, medication management and review of any available recent lab and radiology data.  Preventive health is updated, specifically  Cancer screening and Immunization.   Questions or concerns regarding consultations or procedures which the PT has had in the interim are  addressed. The PT denies any adverse reactions to current medications since the last visit.  States she had been experiencing abdominal pain with nausea and bloating about 3 weeks ago, but this has now resolved , and she has no interest in investigating this now. 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 Has upcoming trip involving flight and cruise, nervous since she  Has never flown , but does not feel she needs medication for this , nor does she need medication for motion sickness.     Review of Systems See HPI Denies recent fever or chills. Denies sinus pressure, nasal congestion, ear pain or sore throat. Denies chest congestion, productive cough or wheezing. Denies chest pains, palpitations and leg swelling Denies abdominal pain, nausea, vomiting,diarrhea or constipation.   Denies dysuria, frequency, hesitancy or incontinence. Denies joint pain, swelling and limitation in mobility. Denies headaches, seizures, numbness, or tingling.  Denies skin break down or rash.        Objective:   Physical Exam Patient alert and oriented and in no cardiopulmonary distress.  HEENT: No facial asymmetry, EOMI, no sinus tenderness,  oropharynx pink and moist.  Neck supple no adenopathy.  Chest: Clear to auscultation bilaterally.  CVS: S1, S2 no murmurs, no S3.  ABD: Soft non tender. Bowel sounds normal.  Ext: No edema  MS: Adequate ROM spine, shoulders, hips and knees.  Skin: Intact, no ulcerations or rash  noted.  Psych: Good eye contact, normal affect. Memory intact not anxious or depressed appearing.  CNS: CN 2-12 intact, power, tone and sensation normal throughout.        Assessment & Plan:

## 2010-12-09 NOTE — Assessment & Plan Note (Signed)
Improved. Pt applauded on succesful weight loss through lifestyle change, and encouraged to continue same. Weight loss goal set for the next several months.  

## 2010-12-16 ENCOUNTER — Ambulatory Visit: Payer: PRIVATE HEALTH INSURANCE

## 2010-12-29 ENCOUNTER — Telehealth: Payer: Self-pay | Admitting: Family Medicine

## 2010-12-30 NOTE — Telephone Encounter (Signed)
Printed and patient is aware

## 2011-01-01 LAB — HEMOGLOBINOPATHY EVALUATION
Hemoglobin Other: 24.5 % — ABNORMAL HIGH (ref 0.0–0.0)
Hgb S Quant: 0 % (ref 0.0–0.0)

## 2011-01-01 LAB — CBC
MCHC: 32.3 g/dL (ref 30.0–36.0)
RBC: 5.84 MIL/uL — ABNORMAL HIGH (ref 3.87–5.11)
WBC: 8.2 10*3/uL (ref 4.0–10.5)

## 2011-01-01 LAB — HGB ELECTROPHORESIS REFLEXED REPORT
Hemoglobin Elect C: 24.5
Hemoglobin Evaluation: ABNORMAL
Hgb E Quant: 0

## 2011-01-01 LAB — HEPATIC FUNCTION PANEL
Albumin: 4.8 g/dL (ref 3.5–5.2)
Total Bilirubin: 0.6 mg/dL (ref 0.3–1.2)
Total Protein: 7.9 g/dL (ref 6.0–8.3)

## 2011-01-01 LAB — FERRITIN: Ferritin: 24 ng/mL (ref 10–291)

## 2011-01-01 LAB — IRON AND TIBC: Iron: 77 ug/dL (ref 42–135)

## 2011-01-13 ENCOUNTER — Ambulatory Visit: Payer: PRIVATE HEALTH INSURANCE | Admitting: Family Medicine

## 2011-01-17 LAB — LIPID PANEL
Cholesterol: 278 mg/dL — ABNORMAL HIGH (ref 0–200)
Total CHOL/HDL Ratio: 3.4 Ratio
VLDL: 13 mg/dL (ref 0–40)

## 2011-01-17 LAB — BASIC METABOLIC PANEL
CO2: 29 mEq/L (ref 19–32)
Chloride: 104 mEq/L (ref 96–112)
Glucose, Bld: 90 mg/dL (ref 70–99)
Potassium: 4.6 mEq/L (ref 3.5–5.3)
Sodium: 140 mEq/L (ref 135–145)

## 2011-01-20 ENCOUNTER — Encounter: Payer: Self-pay | Admitting: Family Medicine

## 2011-01-21 ENCOUNTER — Ambulatory Visit (INDEPENDENT_AMBULATORY_CARE_PROVIDER_SITE_OTHER): Payer: PRIVATE HEALTH INSURANCE | Admitting: Family Medicine

## 2011-01-21 ENCOUNTER — Encounter: Payer: Self-pay | Admitting: Family Medicine

## 2011-01-21 VITALS — BP 140/90 | HR 77 | Resp 16 | Ht 64.5 in | Wt 154.1 lb

## 2011-01-21 DIAGNOSIS — E785 Hyperlipidemia, unspecified: Secondary | ICD-10-CM

## 2011-01-21 DIAGNOSIS — I10 Essential (primary) hypertension: Secondary | ICD-10-CM

## 2011-01-21 DIAGNOSIS — F329 Major depressive disorder, single episode, unspecified: Secondary | ICD-10-CM

## 2011-01-21 NOTE — Patient Instructions (Addendum)
CPE in December  Or January.  Pls start fish oil capsules one twice daily to help with your cholesterol  Blood pressure is 140/90 still high, you need to take the medication.   Please make appointment to see a marriage counselor.  Congarts on weight loss  Please call if you cannot find a counsellor you can be referred to [facility]

## 2011-02-17 NOTE — Assessment & Plan Note (Signed)
Deteriorated, a lot of marital stress , discord and mis understanding for years, marriage counseling strongly suggested

## 2011-02-17 NOTE — Assessment & Plan Note (Signed)
Pt is statin intolerant, she is advised to use fish oil and follow a low fat diet , which she already does

## 2011-02-17 NOTE — Progress Notes (Signed)
  Subjective:    Patient ID: Cindy Weiss, female    DOB: 1960/07/30, 50 y.o.   MRN: [id number]  HPI The PT is here for follow up and re-evaluation of chronic medical conditions, medication management and review of any available recent lab and radiology data.  Preventive health is updated, specifically  Cancer screening and Immunization.   Questions or concerns regarding consultations or procedures which the PT has had in the interim are  addressed. Pt is non compliant with anti hypertensive medication still, resists the fact that she needs it, denies any adverse side effects C/o worsening relationships with her spouse, open to the idea of therapy but wants a christian therapist , who she will look for       Review of Systems    See HPI Denies recent fever or chills. Denies sinus pressure, nasal congestion, ear pain or sore throat. Denies chest congestion, productive cough or wheezing. Denies chest pains, palpitations and leg swelling Denies abdominal pain, nausea, vomiting,diarrhea or constipation.   Denies dysuria, frequency, hesitancy or incontinence. Denies joint pain, swelling and limitation in mobility. Denies headaches, seizures, numbness, or tingling.  Denies skin break down or rash.     Objective:   Physical Exam Patient alert and oriented and in no cardiopulmonary distress.  HEENT: No facial asymmetry, EOMI, no sinus tenderness,  oropharynx pink and moist.  Neck supple no adenopathy.  Chest: Clear to auscultation bilaterally.  CVS: S1, S2 no murmurs, no S3.  ABD: Soft non tender. Bowel sounds normal.  Ext: No edema  MS: Adequate ROM spine, shoulders, hips and knees.  Skin: Intact, no ulcerations or rash noted.  Psych: Good eye contact, normal affect. Memory intact not anxious or depressed appearing.  CNS: CN 2-12 intact, power, tone and sensation normal throughout.        Assessment & Plan:

## 2011-02-17 NOTE — Assessment & Plan Note (Signed)
Uncontrolled, non compliant with medication, importance of compliance stressed

## 2011-03-30 ENCOUNTER — Encounter: Payer: Self-pay | Admitting: Family Medicine

## 2011-04-09 ENCOUNTER — Encounter: Payer: Self-pay | Admitting: Family Medicine

## 2011-04-13 ENCOUNTER — Encounter: Payer: Self-pay | Admitting: Family Medicine

## 2011-04-13 ENCOUNTER — Other Ambulatory Visit (HOSPITAL_COMMUNITY)
Admission: RE | Admit: 2011-04-13 | Discharge: 2011-04-13 | Disposition: A | Discharge status: Home / Self Care | Payer: PRIVATE HEALTH INSURANCE | Source: Ambulatory Visit | Attending: Family Medicine | Admitting: Family Medicine

## 2011-04-13 ENCOUNTER — Other Ambulatory Visit (HOSPITAL_COMMUNITY): Payer: Self-pay | Admitting: General Surgery

## 2011-04-13 ENCOUNTER — Ambulatory Visit (INDEPENDENT_AMBULATORY_CARE_PROVIDER_SITE_OTHER): Payer: PRIVATE HEALTH INSURANCE | Admitting: Family Medicine

## 2011-04-13 VITALS — BP 140/96 | HR 94 | Resp 16 | Ht 64.0 in | Wt 156.1 lb

## 2011-04-13 DIAGNOSIS — D259 Leiomyoma of uterus, unspecified: Secondary | ICD-10-CM

## 2011-04-13 DIAGNOSIS — Z01419 Encounter for gynecological examination (general) (routine) without abnormal findings: Secondary | ICD-10-CM | POA: Insufficient documentation

## 2011-04-13 DIAGNOSIS — Z139 Encounter for screening, unspecified: Secondary | ICD-10-CM

## 2011-04-13 DIAGNOSIS — E785 Hyperlipidemia, unspecified: Secondary | ICD-10-CM

## 2011-04-13 DIAGNOSIS — M899 Disorder of bone, unspecified: Secondary | ICD-10-CM

## 2011-04-13 DIAGNOSIS — Z0000 Encounter for general adult medical examination without abnormal findings: Secondary | ICD-10-CM

## 2011-04-13 DIAGNOSIS — I10 Essential (primary) hypertension: Secondary | ICD-10-CM

## 2011-04-13 MED ORDER — AMLODIPINE BESYLATE 2.5 MG PO TABS: 2.5000 mg | ORAL_TABLET | Freq: Every day | ORAL | Status: DC

## 2011-04-13 NOTE — Assessment & Plan Note (Signed)
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

## 2011-04-13 NOTE — Assessment & Plan Note (Signed)
Uncontrolled due to non compliance . Pt re educated about the importance of medication compliance, states she will fill script, EKG today

## 2011-04-13 NOTE — Progress Notes (Signed)
  Subjective:    Patient ID: Cindy Weiss, female    DOB: Jul 15, 1960, 51 y.o.   MRN: [id number]  HPI The PT is here for annual exam and re-evaluation of chronic medical conditions, medication management and review of any available recent lab and radiology data.  Preventive health is updated, specifically  Cancer screening and Immunization.   Questions or concerns regarding consultations or procedures which the PT has had in the interim are  addressed. The PT denies any adverse reactions to current medications since the last visit. She still has not filled the script for amlodipine but states she will do this this time Reports improved relationships with her spouse and feels happy       Review of Systems See HPI Denies recent fever or chills. Denies sinus pressure, nasal congestion, ear pain or sore throat. Denies chest congestion, productive cough or wheezing. Denies chest pains, palpitations and leg swelling Denies abdominal pain, nausea, vomiting,diarrhea or constipation.   Denies dysuria, frequency, hesitancy or incontinence. Denies joint pain, swelling and limitation in mobility. Denies headaches, seizures, numbness, or tingling. Denies depression, anxiety or insomnia. Denies skin break down or rash.        Objective:   Physical Exam Pleasant well nourished female, alert and oriented x 3, in no cardio-pulmonary distress. Afebrile. HEENT No facial trauma or asymetry. Sinuses non tender.  EOMI, PERTL, fundoscopic exam is normal, no hemorhage or exudate.  External ears normal, tympanic membranes clear. Oropharynx moist, no exudate,fair  dentition. Neck: supple, no adenopathy,JVD or thyromegaly.No bruits.  Chest: Clear to ascultation bilaterally.No crackles or wheezes. Non tender to palpation  Breast: No asymetry,no masses. No nipple discharge or inversion. No axillary or supraclavicular adenopathy  Cardiovascular system; Heart sounds normal,  S1 and  S2 ,no S3.   No murmur, or thrill. Apical beat not displaced Peripheral pulses normal.  Abdomen: Soft, non tender, no organomegaly or masses. No bruits. Bowel sounds normal. No guarding, tenderness or rebound.  Rectal:  No mass. Guaiac negative stool.  GU: External genitalia normal. No lesions. Vaginal canal normal.No discharge. Uterus enlarged, approx 20 week size, no adnexal masses, no cervical motion or adnexal tenderness.  Musculoskeletal exam: Full ROM of spine, hips , shoulders and knees. No deformity ,swelling or crepitus noted. No muscle wasting or atrophy.   Neurologic: Cranial nerves 2 to 12 intact. Power, tone ,sensation and reflexes normal throughout. No disturbance in gait. No tremor.  Skin: Intact, no ulceration, erythema , scaling or rash noted. Pigmentation normal throughout  Psych; Normal mood and affect. Judgement and concentration normal        Assessment & Plan:

## 2011-04-13 NOTE — Patient Instructions (Signed)
F/u in 2 months. It is vital you start taking the medication for high blood pressure whichj is prescribed.  Lipid, and vit D today   Your EKG is normal.  You are referred for an ultrasound to re evaluate your womb.  You will be given stool cards with directions to check your stool

## 2011-04-13 NOTE — Assessment & Plan Note (Signed)
Statin intolerant. On fish oil, will rept lab

## 2011-04-14 LAB — LIPID PANEL
Cholesterol: 298 mg/dL — ABNORMAL HIGH (ref 0–200)
Total CHOL/HDL Ratio: 3.7 Ratio
VLDL: 11 mg/dL (ref 0–40)

## 2011-04-16 ENCOUNTER — Other Ambulatory Visit (HOSPITAL_COMMUNITY): Payer: Self-pay | Admitting: Family Medicine

## 2011-04-16 ENCOUNTER — Ambulatory Visit (HOSPITAL_COMMUNITY)
Admission: RE | Admit: 2011-04-16 | Discharge: 2011-04-16 | Disposition: A | Discharge status: Home / Self Care | Payer: PRIVATE HEALTH INSURANCE | Source: Ambulatory Visit | Attending: Family Medicine | Admitting: Family Medicine

## 2011-04-16 ENCOUNTER — Ambulatory Visit (HOSPITAL_COMMUNITY): Payer: PRIVATE HEALTH INSURANCE

## 2011-04-16 DIAGNOSIS — D259 Leiomyoma of uterus, unspecified: Secondary | ICD-10-CM | POA: Insufficient documentation

## 2011-04-17 ENCOUNTER — Other Ambulatory Visit: Payer: Self-pay

## 2011-04-17 MED ORDER — CHOLINE FENOFIBRATE 135 MG PO CPDR: 135.0000 mg | DELAYED_RELEASE_CAPSULE | Freq: Every day | ORAL | Status: DC

## 2011-04-17 NOTE — Progress Notes (Signed)
Addended by: Abner Greenspan on: 04/17/2011 03:50 PM   Modules accepted: Orders

## 2011-04-20 ENCOUNTER — Other Ambulatory Visit: Payer: Self-pay | Admitting: Family Medicine

## 2011-04-20 ENCOUNTER — Telehealth: Payer: Self-pay | Admitting: Family Medicine

## 2011-04-20 DIAGNOSIS — Z1211 Encounter for screening for malignant neoplasm of colon: Secondary | ICD-10-CM

## 2011-04-20 LAB — HEMOCCULT GUIAC POC 1CARD (OFFICE)
Card #3 Fecal Occult Blood, POC: NEGATIVE
Fecal Occult Blood, POC: NEGATIVE

## 2011-04-20 NOTE — Progress Notes (Signed)
Documented results of home stool cards

## 2011-04-20 NOTE — Telephone Encounter (Signed)
Also been feeling nauseated and dizzy lately. Doesn't know whats causing all this. Has a tightness in her upper abdomen that she's had for awhile

## 2011-04-20 NOTE — Telephone Encounter (Signed)
After taking her blood pressure med she stood up and she started breathing fast and got very panicky. she took her med at 11:30 and then a few minutes later. Before she had even taken the med, this am when she was getting out of bed she lost her balance and almost fell. That was before she had the med. Wants to know what she needs to do

## 2011-04-20 NOTE — Telephone Encounter (Signed)
spoke with pt, does not want med  For vertigo which she experienced this morning before taking her BP medication. C/o nausea and bloating will order GBUS. The fibroids and pelvic exam was re addressed with her no gynae eval at this time

## 2011-04-21 ENCOUNTER — Encounter (HOSPITAL_COMMUNITY): Payer: Self-pay | Admitting: Emergency Medicine

## 2011-04-21 ENCOUNTER — Emergency Department (HOSPITAL_COMMUNITY): Payer: PRIVATE HEALTH INSURANCE

## 2011-04-21 ENCOUNTER — Other Ambulatory Visit: Payer: Self-pay

## 2011-04-21 ENCOUNTER — Telehealth: Payer: Self-pay | Admitting: Family Medicine

## 2011-04-21 ENCOUNTER — Emergency Department (HOSPITAL_COMMUNITY)
Admission: EM | Admit: 2011-04-21 | Discharge: 2011-04-21 | Disposition: A | Discharge status: Home / Self Care | Payer: PRIVATE HEALTH INSURANCE | Attending: Emergency Medicine | Admitting: Emergency Medicine

## 2011-04-21 ENCOUNTER — Ambulatory Visit (INDEPENDENT_AMBULATORY_CARE_PROVIDER_SITE_OTHER): Payer: PRIVATE HEALTH INSURANCE | Admitting: Family Medicine

## 2011-04-21 ENCOUNTER — Encounter: Payer: Self-pay | Admitting: Family Medicine

## 2011-04-21 VITALS — BP 120/90 | HR 72 | Resp 14 | Ht 64.0 in | Wt 162.0 lb

## 2011-04-21 DIAGNOSIS — E7800 Pure hypercholesterolemia, unspecified: Secondary | ICD-10-CM | POA: Insufficient documentation

## 2011-04-21 DIAGNOSIS — E785 Hyperlipidemia, unspecified: Secondary | ICD-10-CM

## 2011-04-21 DIAGNOSIS — D259 Leiomyoma of uterus, unspecified: Secondary | ICD-10-CM | POA: Insufficient documentation

## 2011-04-21 DIAGNOSIS — R0989 Other specified symptoms and signs involving the circulatory and respiratory systems: Secondary | ICD-10-CM | POA: Insufficient documentation

## 2011-04-21 DIAGNOSIS — Z79899 Other long term (current) drug therapy: Secondary | ICD-10-CM | POA: Insufficient documentation

## 2011-04-21 DIAGNOSIS — R42 Dizziness and giddiness: Secondary | ICD-10-CM | POA: Insufficient documentation

## 2011-04-21 DIAGNOSIS — I10 Essential (primary) hypertension: Secondary | ICD-10-CM | POA: Insufficient documentation

## 2011-04-21 DIAGNOSIS — R110 Nausea: Secondary | ICD-10-CM

## 2011-04-21 HISTORY — DX: Benign neoplasm of connective and other soft tissue, unspecified: D21.9

## 2011-04-21 HISTORY — DX: Essential (primary) hypertension: I10

## 2011-04-21 LAB — CBC
Hemoglobin: 11.7 g/dL — ABNORMAL LOW (ref 12.0–15.0)
MCH: 22.5 pg — ABNORMAL LOW (ref 26.0–34.0)
RBC: 5.21 MIL/uL — ABNORMAL HIGH (ref 3.87–5.11)
WBC: 9.5 10*3/uL (ref 4.0–10.5)

## 2011-04-21 LAB — BASIC METABOLIC PANEL
BUN: 13 mg/dL (ref 6–23)
CO2: 29 mEq/L (ref 19–32)
Chloride: 104 mEq/L (ref 96–112)
Glucose, Bld: 105 mg/dL — ABNORMAL HIGH (ref 70–99)
Potassium: 3.9 mEq/L (ref 3.5–5.1)

## 2011-04-21 LAB — DIFFERENTIAL
Lymphocytes Relative: 17 % (ref 12–46)
Lymphs Abs: 1.6 10*3/uL (ref 0.7–4.0)
Monocytes Relative: 6 % (ref 3–12)
Neutro Abs: 7.3 10*3/uL (ref 1.7–7.7)
Neutrophils Relative %: 77 % (ref 43–77)

## 2011-04-21 MED ORDER — MECLIZINE HCL 50 MG PO TABS: 50.0000 mg | ORAL_TABLET | Freq: Three times a day (TID) | ORAL | Status: AC | PRN

## 2011-04-21 NOTE — ED Notes (Signed)
Patient reports that she started feeling dizzy Sunday night, also states nausea accompanied dizziness along with intermittent headaches and upper abdominal pains. Patient states that she is just anxious and trembling now. Patient appears to be very anxious.

## 2011-04-21 NOTE — ED Notes (Signed)
Notified patient that we are waiting for her mri which will be done at 8:00 a.m.

## 2011-04-21 NOTE — ED Notes (Signed)
Patient also reports feeling nauseated when this started and has had intermittent headaches and upper abdominal pain. States she broke up in cold sweats when she started feeling dizzy and started trembling. Was just recently started on new blood pressure medication and cholesterol medication.

## 2011-04-21 NOTE — ED Notes (Signed)
Patient states she started feeling dizzy when she laid her head on her pillow Sunday night. Also reports attempting to get up yesterday, she states she felt dizzy and almost fell back.

## 2011-04-21 NOTE — ED Provider Notes (Signed)
History     CSN: [id number]  Arrival date & time 04/21/11  0309   First MD Initiated Contact with Patient 04/21/11 [phone]      Chief Complaint  Patient presents with  . Dizziness    (Consider location/radiation/quality/duration/timing/severity/associated sxs/prior treatment) The history is provided by the patient.   51 year old female has been having dizziness for the last 2 nights. She stated that when she laid down on butter head on the pillow, it felt like she was continuing to sit even though she was not moving. Then when she tried to get up, she fell right back on the bed because of dizziness. She denies vertigo. She has had some nausea but no vomiting. She denies chest pain or heaviness tightness or pressure. She's not had any fever, chills. She did break out in sweats. During the day, she has not had any dizziness and does not have any difficulty with her balance. She was started on a blood pressure medicine yesterday, but the first episode of dizziness occurred prior to her starting the blood pressure medicine. Symptoms are severe. It is worse with position changes noted above, nothing makes it better.  Past Medical History  Diagnosis Date  . Anemia   . Hypertension   . High cholesterol   . Fibroid tumor   . Fibroids     Past Surgical History  Procedure Date  . Mymectomy   . Breast lumpectomy     left breast   . Fibroid tumor removal     Family History  Problem Relation Age of Onset  . Hyperlipidemia Mother   . Thyroid disease Father   . Hypertension Sister   . Hyperlipidemia Mother     History  Substance Use Topics  . Smoking status: Never Smoker   . Smokeless tobacco: Not on file  . Alcohol Use: No    OB History    Grav Para Term Preterm Abortions TAB SAB Ect Mult Living                  Review of Systems  Gastrointestinal:       Indigestion, belching  All other systems reviewed and are negative.    Allergies  Pravastatin  Home Medications    Current Outpatient Rx  Name Route Sig Dispense Refill  . AMLODIPINE BESYLATE 2.5 MG PO TABS Oral Take 1 tablet (2.5 mg total) by mouth daily. 30 tablet 11  . AMLODIPINE BESYLATE 2.5 MG PO TABS Oral Take 1 tablet (2.5 mg total) by mouth daily. 30 tablet 11  . CALCIUM CARBONATE-VITAMIN D 500-200 MG-UNIT PO TABS Oral Take 1 tablet by mouth 2 (two) times daily.     . CHOLINE FENOFIBRATE 135 MG PO CPDR Oral Take 1 capsule (135 mg total) by mouth daily. 30 capsule 3  . OMEGA-3 FATTY ACIDS 1000 MG PO CAPS Oral Take 2 g by mouth daily.    . MULTIVITAMIN PO Oral Take 2 tablets by mouth daily.       BP 145/94  Pulse 79  Temp(Src) 97.8 F (36.6 C) (Oral)  Resp 18  Ht 5\' 5"  (1.651 m)  Wt 165 lb (74.844 kg)  BMI 27.46 kg/m2  SpO2 100%  Physical Exam  Nursing note and vitals reviewed.  51 year old female who is resting comfortably and in no acute distress. Vital signs show mild hypertension blood pressure 145/94. Head is normocephalic and atraumatic. PERRLA, EOMI without nystagmus. Oropharynx is clear. Neck is supple without adenopathy, JVD, or bruit. Dizziness is not  reproduced by head movement. Lungs are clear without rales, wheezes, rhonchi. Back is nontender. Heart has regular rate and rhythm without murmur. Is no chest wall tenderness. Abdomen is soft, flat, nontender without hepatosplenomegaly. Fibroid uterus is palpable at about 20 weeks size. Extremities have no cyanosis or edema, full range of motion is present. Skin is warm and moist without rash. Neurologic: Mental status is normal, cranial nerves are intact. There no focal motor or sensory deficits. Romberg is positive in that she's consistently falls to the left. She is stable when sitting. Gait was not tested.  ED Course  Procedures (including critical care time)  Results for orders placed during the hospital encounter of 04/21/11  CBC      Component Value Range   WBC 9.5  4.0 - 10.5 (K/uL)   RBC 5.21 (*) 3.87 - 5.11 (MIL/uL)    Hemoglobin 11.7 (*) 12.0 - 15.0 (g/dL)   HCT 96.0 (*) 45.4 - 46.0 (%)   MCV 67.9 (*) 78.0 - 100.0 (fL)   MCH 22.5 (*) 26.0 - 34.0 (pg)   MCHC 33.1  30.0 - 36.0 (g/dL)   RDW 09.8  11.9 - 14.7 (%)   Platelets 225  150 - 400 (K/uL)  DIFFERENTIAL      Component Value Range   Neutrophils Relative 77  43 - 77 (%)   Neutro Abs 7.3  1.7 - 7.7 (K/uL)   Lymphocytes Relative 17  12 - 46 (%)   Lymphs Abs 1.6  0.7 - 4.0 (K/uL)   Monocytes Relative 6  3 - 12 (%)   Monocytes Absolute 0.6  0.1 - 1.0 (K/uL)   Eosinophils Relative 1  0 - 5 (%)   Eosinophils Absolute 0.1  0.0 - 0.7 (K/uL)   Basophils Relative 0  0 - 1 (%)   Basophils Absolute 0.0  0.0 - 0.1 (K/uL)  BASIC METABOLIC PANEL      Component Value Range   Sodium 138  135 - 145 (mEq/L)   Potassium 3.9  3.5 - 5.1 (mEq/L)   Chloride 104  96 - 112 (mEq/L)   CO2 29  19 - 32 (mEq/L)   Glucose, Bld 105 (*) 70 - 99 (mg/dL)   BUN 13  6 - 23 (mg/dL)   Creatinine, Ser 8.29  0.50 - 1.10 (mg/dL)   Calcium 9.6  8.4 - 56.2 (mg/dL)   GFR calc non Af Amer >90  >90 (mL/min)   GFR calc Af Amer >90  >90 (mL/min)   Ct Head Wo Contrast  04/21/2011  *RADIOLOGY REPORT*  Clinical Data: Dizziness and weakness; hypertension.  CT HEAD WITHOUT CONTRAST  Technique:  Contiguous axial images were obtained from the base of the skull through the vertex without contrast.  Comparison: MRI of the brain performed 03/12/2008  Findings: There is no evidence of acute infarction, mass lesion, or intra- or extra-axial hemorrhage on CT.  The posterior fossa, including the cerebellum, brainstem and fourth ventricle, is within normal limits.  The third and lateral ventricles, and basal ganglia are unremarkable in appearance.  The cerebral hemispheres are symmetric in appearance, with normal gray- white differentiation.  No mass effect or midline shift is seen.  There is no evidence of fracture; visualized osseous structures are unremarkable in appearance.  The visualized portions of the  orbits are within normal limits.  The paranasal sinuses and mastoid air cells are well-aerated.  No significant soft tissue abnormalities are seen.  IMPRESSION: Unremarkable noncontrast CT of the head.  Original Report  Authenticated By: Tonia Ghent, M.D.   US Transvaginal Non-ob  04/16/2011  *RADIOLOGY REPORT*  Clinical Data: History of fibroid removal.  Unsure LMP  TRANSABDOMINAL AND TRANSVAGINAL ULTRASOUND OF PELVIS Technique:  Both transabdominal and transvaginal ultrasound examinations of the pelvis were performed. Transabdominal technique was performed for global imaging of the pelvis including uterus, ovaries, adnexal regions, and pelvic cul-de-sac.  Comparison: 03/12/2008   It was necessary to proceed with endovaginal exam following the transabdominal exam to visualize the adnexa.  Findings:  Uterus: Is enlarged with a sagittal length of approximately 16.5 cm, depth of approximately 8 cm and width of approximately 9 cm. Multiple fibroids are identified and are best assessed transabdominally due to the large uterine size.  Fibroids which can be accurately measured have sizes and locations as follows:  In the central lower uterine segment measuring 7.2 x 8.3 x 7.4 cm  In the anterior fundal region measuring 7.6 x 7.7 x 8.3 cm.  In the inferior fundal region measuring 6.9 x 6.5 by 7.1 cm.  Endometrium: Could not be adequately visualized to allow accurate measurement or determination of the relationship between the fibroids and endometrial lining.  Right ovary:  Is not seen with confidence either transabdominally or endovaginally  Left ovary: Is seen only transabdominally measuring 3.8 x 2.3 x 2.6 cm.  Other findings: No pelvic fluid or separate adnexal masses are seen.  IMPRESSION: Enlarged fibroid uterus.  The endometrial canal cannot be seen and determination of the relationship between the endometrium and fibroids could not be performed on this study.  If further assessment of the endometrium and  endometrial/fibroid relationship is desired, pelvic MRI would be recommended  Non-visualized right ovary and normal left ovary.  Original Report Authenticated By: Bertha Stakes, M.D.   US Pelvis Complete  04/16/2011  *RADIOLOGY REPORT*  Clinical Data: History of fibroid removal.  Unsure LMP  TRANSABDOMINAL AND TRANSVAGINAL ULTRASOUND OF PELVIS Technique:  Both transabdominal and transvaginal ultrasound examinations of the pelvis were performed. Transabdominal technique was performed for global imaging of the pelvis including uterus, ovaries, adnexal regions, and pelvic cul-de-sac.  Comparison: 03/12/2008   It was necessary to proceed with endovaginal exam following the transabdominal exam to visualize the adnexa.  Findings:  Uterus: Is enlarged with a sagittal length of approximately 16.5 cm, depth of approximately 8 cm and width of approximately 9 cm. Multiple fibroids are identified and are best assessed transabdominally due to the large uterine size.  Fibroids which can be accurately measured have sizes and locations as follows:  In the central lower uterine segment measuring 7.2 x 8.3 x 7.4 cm  In the anterior fundal region measuring 7.6 x 7.7 x 8.3 cm.  In the inferior fundal region measuring 6.9 x 6.5 by 7.1 cm.  Endometrium: Could not be adequately visualized to allow accurate measurement or determination of the relationship between the fibroids and endometrial lining.  Right ovary:  Is not seen with confidence either transabdominally or endovaginally  Left ovary: Is seen only transabdominally measuring 3.8 x 2.3 x 2.6 cm.  Other findings: No pelvic fluid or separate adnexal masses are seen.  IMPRESSION: Enlarged fibroid uterus.  The endometrial canal cannot be seen and determination of the relationship between the endometrium and fibroids could not be performed on this study.  If further assessment of the endometrium and endometrial/fibroid relationship is desired, pelvic MRI would be recommended   Non-visualized right ovary and normal left ovary.  Original Report Authenticated By: Bertha Stakes, M.D.  ECG shows normal sinus rhythm with a rate of 76, no ectopy. Normal axis. Normal P wave. Normal QRS. Normal intervals. Normal ST and T waves. Impression: normal ECG. No old ECG available for comparison.  Workup is unremarkable to this point. Because of her symptoms and signs which are suggestive of a posterior circulation stroke, she will be sent for an MRI scan. Also, she states that she has been scheduled for an abdominal ultrasound to rule out gallstones and was hoping that that could be done while she is here. I reviewed her records and indeed the ultrasound has been ordered but not scheduled. Ultrasound is ordered here.  Case is signed out to Dr. Radford Pax to followup the results of the above tests.   MDM  Dizziness which seems most consistent with central vertigo. Workup has been initiated.        Dione Booze, MD 04/21/11 [phone]

## 2011-04-21 NOTE — ED Notes (Signed)
Report given to joanna,rn

## 2011-04-21 NOTE — ED Notes (Signed)
Patient states now she is feeling nervous, trembly, and tired.

## 2011-04-21 NOTE — Progress Notes (Signed)
  Subjective:    Patient ID: Cindy Weiss, female    DOB: [birth year], 51 y.o.   MRN: [id number]  HPI Pt seen in the Ed this morning states  She had 3 episodes of near passing out when she just woke up, states her husband caught her, and she was seen in The ED today. Has had multiple tests all negative, she is dx as inner ear. Reports a lot of anxiety about taking prescription med, and believes this is the underlying reason she had the episodes. Still concerned about nausea and bloating, an US done in the ED was negative for gallstones Review of Systems See HPI Denies recent fever or chills. Denies sinus pressure, nasal congestion, ear pain or sore throat. Denies chest congestion, productive cough or wheezing. Denies chest pains, palpitations and leg swelling Denies vomiting,diarrhea or constipation.   Denies dysuria, frequency, hesitancy or incontinence. Denies joint pain, swelling and limitation in mobility. Denies headaches, seizures, numbness, or tingling. Denies depression,does report  Anxiety denies  insomnia. Denies skin break down or rash.        Objective:   Physical Exam Patient alert and oriented and in no cardiopulmonary distress.  HEENT: No facial asymmetry, EOMI, no sinus tenderness,  oropharynx pink and moist.  Neck supple no adenopathy.Bruit  Chest: Clear to auscultation bilaterally.  CVS: S1, S2 no murmurs, no S3.  ABD: Soft mild epigastric and RUQ tenderness, no guarding or rebound . Bowel sounds normal.  Ext: No edema  MS: Adequate ROM spine, shoulders, hips and knees.  Skin: Intact, no ulcerations or rash noted.  Psych: Good eye contact, normal affect. Memory intact not anxious or depressed appearing.  CNS: CN 2-12 intact, power, tone and sensation normal throughout.        Assessment & Plan:

## 2011-04-21 NOTE — Patient Instructions (Signed)
F/u in 3 weeks  Work excuse from 01/22 to return 04/27/2011.  Please take med at 10 pm every night  Drink at least 64 ounces water every day.  You are referred for neck US to evaluate for blockage, also for another gall bladder study

## 2011-04-21 NOTE — ED Notes (Signed)
Pt remains in xray dept. 

## 2011-04-21 NOTE — ED Notes (Signed)
Pt to US/MRI dept.

## 2011-04-22 ENCOUNTER — Encounter (HOSPITAL_COMMUNITY): Payer: Self-pay

## 2011-04-22 ENCOUNTER — Ambulatory Visit (HOSPITAL_COMMUNITY)
Admission: RE | Admit: 2011-04-22 | Discharge: 2011-04-22 | Disposition: A | Discharge status: Home / Self Care | Payer: PRIVATE HEALTH INSURANCE | Source: Ambulatory Visit | Attending: Family Medicine | Admitting: Family Medicine

## 2011-04-22 DIAGNOSIS — R110 Nausea: Secondary | ICD-10-CM

## 2011-04-22 DIAGNOSIS — R0989 Other specified symptoms and signs involving the circulatory and respiratory systems: Secondary | ICD-10-CM

## 2011-04-22 DIAGNOSIS — R42 Dizziness and giddiness: Secondary | ICD-10-CM | POA: Insufficient documentation

## 2011-04-22 MED ORDER — SINCALIDE 5 MCG IJ SOLR
0.0200 ug/kg | Freq: Once | INTRAMUSCULAR | Status: AC
  Administered 2011-04-22: 1.43 ug via INTRAVENOUS

## 2011-04-22 MED ORDER — TECHNETIUM TC 99M MEBROFENIN IV KIT
5.0000 | PACK | Freq: Once | INTRAVENOUS | Status: AC | PRN
  Administered 2011-04-22: 5 via INTRAVENOUS

## 2011-04-25 ENCOUNTER — Encounter: Payer: Self-pay | Admitting: Family Medicine

## 2011-04-25 NOTE — Assessment & Plan Note (Signed)
With reported near syncope, carotid doppler studies are ordered

## 2011-04-25 NOTE — Assessment & Plan Note (Signed)
Uncontrolled, will attempt to take her med at bedtime, work excuse for 3 days while she is getting accustomed to med

## 2011-04-25 NOTE — Assessment & Plan Note (Signed)
Pt already follows a low fat diet, she is to start trilipix

## 2011-04-25 NOTE — Assessment & Plan Note (Signed)
Chronic nausea and bloating will schedule HIDA

## 2011-04-27 ENCOUNTER — Other Ambulatory Visit (HOSPITAL_COMMUNITY): Payer: PRIVATE HEALTH INSURANCE

## 2011-04-29 ENCOUNTER — Encounter: Payer: Self-pay | Admitting: Family Medicine

## 2011-05-12 ENCOUNTER — Ambulatory Visit: Payer: PRIVATE HEALTH INSURANCE | Admitting: Family Medicine

## 2011-05-21 ENCOUNTER — Ambulatory Visit: Payer: PRIVATE HEALTH INSURANCE | Admitting: Family Medicine

## 2011-05-22 NOTE — Telephone Encounter (Signed)
Pt aware.

## 2011-06-15 ENCOUNTER — Encounter: Payer: Self-pay | Admitting: Family Medicine

## 2011-06-15 ENCOUNTER — Ambulatory Visit (INDEPENDENT_AMBULATORY_CARE_PROVIDER_SITE_OTHER): Payer: PRIVATE HEALTH INSURANCE | Admitting: Family Medicine

## 2011-06-15 VITALS — BP 120/82 | HR 84 | Resp 16 | Ht 64.0 in | Wt 156.1 lb

## 2011-06-15 DIAGNOSIS — E785 Hyperlipidemia, unspecified: Secondary | ICD-10-CM

## 2011-06-15 DIAGNOSIS — E663 Overweight: Secondary | ICD-10-CM

## 2011-06-15 DIAGNOSIS — I10 Essential (primary) hypertension: Secondary | ICD-10-CM

## 2011-06-15 NOTE — Assessment & Plan Note (Signed)
Controlled, no change in medication  

## 2011-06-15 NOTE — Patient Instructions (Signed)
F/u in July.  Fasting lipid and cmp inJuly before visit.   BP is excellent and I am thankful you are tolerating your medicine.CONGRATS It is important that you exercise regularly at least 30 minutes 5 times a week. If you develop chest pain, have severe difficulty breathing, or feel very tired, stop exercising immediately and seek medical attention    A healthy diet is rich in fruit, vegetables and whole grains. Poultry fish, nuts and beans are a healthy choice for protein rather then red meat. A low sodium diet and drinking 64 ounces of water daily is generally recommended. Oils and sweet should be limited. Carbohydrates especially for those who are diabetic or overweight, should be limited to 30-45 gram per meal. It is important to eat on a regular schedule, at least 3 times daily. Snacks should be primarily fruits, vegetables or nuts.

## 2011-06-15 NOTE — Progress Notes (Signed)
  Subjective:    Patient ID: Cindy Weiss, female    DOB: 07/16/1960, 50 y.o.   MRN: [id number]  HPI The PT is here for follow up and re-evaluation of chronic medical conditions, medication management and review of any available recent lab and radiology data.  Preventive health is updated, specifically  Cancer screening and Immunization.   Questions or concerns regarding consultations or procedures which the PT has had in the interim are  addressed. The PT denies any adverse reactions to current medications since the last visit.  There are no new concerns.  There are no specific complaints       Review of Systems See HPI Denies recent fever or chills. Denies sinus pressure, nasal congestion, ear pain or sore throat. Denies chest congestion, productive cough or wheezing. Denies chest pains, palpitations and leg swelling Denies abdominal pain, nausea, vomiting,diarrhea or constipation.   Denies dysuria, frequency, hesitancy or incontinence. Denies joint pain, swelling and limitation in mobility. Denies headaches, seizures, numbness, or tingling. Denies depression, anxiety or insomnia. Denies skin break down or rash.        Objective:   Physical Exam Patient alert and oriented and in no cardiopulmonary distress.  HEENT: No facial asymmetry, EOMI, no sinus tenderness,  oropharynx pink and moist.  Neck supple no adenopathy.  Chest: Clear to auscultation bilaterally.  CVS: S1, S2 no murmurs, no S3.  ABD: Soft non tender. Bowel sounds normal.  Ext: No edema  MS: Adequate ROM spine, shoulders, hips and knees.  Skin: Intact, no ulcerations or rash noted.  Psych: Good eye contact, normal affect. Memory intact not anxious or depressed appearing.  CNS: CN 2-12 intact, power, tone and sensation normal throughout.        Assessment & Plan:   

## 2011-06-15 NOTE — Assessment & Plan Note (Signed)
Improved. Pt applauded on succesful weight loss through lifestyle change, and encouraged to continue same. Weight loss goal set for the next several months.  

## 2011-06-15 NOTE — Assessment & Plan Note (Signed)
Hyperlipidemia:Low fat diet discussed and encouraged.  Pt started trilipix March 1, rept lab in 4 month.  Follow low fat diet

## 2011-07-09 ENCOUNTER — Ambulatory Visit (HOSPITAL_COMMUNITY): Payer: PRIVATE HEALTH INSURANCE

## 2011-07-13 ENCOUNTER — Ambulatory Visit (HOSPITAL_COMMUNITY)
Admission: RE | Admit: 2011-07-13 | Discharge: 2011-07-13 | Disposition: A | Discharge status: Home / Self Care | Payer: PRIVATE HEALTH INSURANCE | Source: Ambulatory Visit | Attending: General Surgery | Admitting: General Surgery

## 2011-07-13 DIAGNOSIS — Z139 Encounter for screening, unspecified: Secondary | ICD-10-CM

## 2011-07-13 DIAGNOSIS — Z1231 Encounter for screening mammogram for malignant neoplasm of breast: Secondary | ICD-10-CM | POA: Insufficient documentation

## 2011-07-13 DIAGNOSIS — N6459 Other signs and symptoms in breast: Secondary | ICD-10-CM | POA: Insufficient documentation

## 2011-07-16 ENCOUNTER — Other Ambulatory Visit: Payer: Self-pay | Admitting: General Surgery

## 2011-07-16 DIAGNOSIS — R928 Other abnormal and inconclusive findings on diagnostic imaging of breast: Secondary | ICD-10-CM

## 2011-07-29 ENCOUNTER — Other Ambulatory Visit (HOSPITAL_COMMUNITY): Payer: Self-pay | Admitting: General Surgery

## 2011-07-29 ENCOUNTER — Other Ambulatory Visit: Payer: Self-pay | Admitting: Family Medicine

## 2011-07-29 ENCOUNTER — Ambulatory Visit (HOSPITAL_COMMUNITY)
Admission: RE | Admit: 2011-07-29 | Discharge: 2011-07-29 | Disposition: A | Discharge status: Home / Self Care | Payer: PRIVATE HEALTH INSURANCE | Source: Ambulatory Visit | Attending: General Surgery | Admitting: General Surgery

## 2011-07-29 DIAGNOSIS — R928 Other abnormal and inconclusive findings on diagnostic imaging of breast: Secondary | ICD-10-CM

## 2011-07-29 DIAGNOSIS — N63 Unspecified lump in unspecified breast: Secondary | ICD-10-CM | POA: Insufficient documentation

## 2011-10-02 ENCOUNTER — Other Ambulatory Visit: Payer: Self-pay

## 2011-10-02 MED ORDER — CHOLINE FENOFIBRATE 135 MG PO CPDR: 135.0000 mg | DELAYED_RELEASE_CAPSULE | Freq: Every day | ORAL | Status: DC

## 2011-10-17 ENCOUNTER — Other Ambulatory Visit: Payer: Self-pay | Admitting: Family Medicine

## 2011-10-17 LAB — LIPID PANEL
HDL: 103 mg/dL (ref >39)
LDL Cholesterol: 138 mg/dL — ABNORMAL HIGH (ref 0–99)
Triglycerides: 35 mg/dL (ref <150)
VLDL: 7 mg/dL (ref 0–40)

## 2011-10-17 LAB — COMPREHENSIVE METABOLIC PANEL
Alkaline Phosphatase: 85 U/L (ref 39–117)
BUN: 23 mg/dL (ref 6–23)
Glucose, Bld: 88 mg/dL (ref 70–99)
Total Bilirubin: 0.4 mg/dL (ref 0.3–1.2)

## 2011-10-20 ENCOUNTER — Encounter: Payer: Self-pay | Admitting: Family Medicine

## 2011-10-20 ENCOUNTER — Ambulatory Visit (INDEPENDENT_AMBULATORY_CARE_PROVIDER_SITE_OTHER): Payer: PRIVATE HEALTH INSURANCE | Admitting: Family Medicine

## 2011-10-20 VITALS — BP 118/82 | HR 80 | Resp 16 | Ht 64.0 in | Wt 156.0 lb

## 2011-10-20 DIAGNOSIS — E663 Overweight: Secondary | ICD-10-CM

## 2011-10-20 DIAGNOSIS — I10 Essential (primary) hypertension: Secondary | ICD-10-CM

## 2011-10-20 DIAGNOSIS — E785 Hyperlipidemia, unspecified: Secondary | ICD-10-CM

## 2011-10-20 DIAGNOSIS — D509 Iron deficiency anemia, unspecified: Secondary | ICD-10-CM

## 2011-10-20 NOTE — Patient Instructions (Addendum)
cPE January 15 or after.  Please call if you need to see me before.  pls call in October for your flu vaccine.  Blood pressure is excellent, and cholesterol MUCH better, continue current medication  I am happy that you are tolerating and taking your medicine well. i am thankful you feel better overall  CBC, fasting chem 7, lipid, hepatic,tSH and hBA1C in January  It is important that you exercise regularly at least 30 minutes6 times a week. If you develop chest pain, have severe difficulty breathing, or feel very tired, stop exercising immediately and seek medical attention   A healthy diet is rich in fruit, vegetables and whole grains. Poultry fish, nuts and beans are a healthy choice for protein rather then red meat. A low sodium diet and drinking 64 ounces of water daily is generally recommended. Oils and sweet should be limited. Carbohydrates especially for those who are diabetic or overweight, should be limited to 30-45 gram per meal. It is important to eat on a regular schedule, at least 3 times daily. Snacks should be primarily fruits, vegetables or nuts.

## 2011-10-24 NOTE — Assessment & Plan Note (Signed)
Improved, continue to follow low fat diet and continue current medication

## 2011-10-24 NOTE — Progress Notes (Signed)
  Subjective:    Patient ID: Cindy Weiss, female    DOB: [birth year], 51 y.o.   MRN: [id number]  HPI The PT is here for follow up and re-evaluation of chronic medical conditions, medication management and review of any available recent lab and radiology data.  Preventive health is updated, specifically  Cancer screening and Immunization.   Questions or concerns regarding consultations or procedures which the PT has had in the interim are  addressed. The PT denies any adverse reactions to current medications since the last visit.  There are no new concerns.  There are no specific complaints       Review of Systems See HPI Denies recent fever or chills. Denies sinus pressure, nasal congestion, ear pain or sore throat. Denies chest congestion, productive cough or wheezing. Denies chest pains, palpitations and leg swelling Denies abdominal pain, nausea, vomiting,diarrhea or constipation.   Denies dysuria, frequency, hesitancy or incontinence. Denies joint pain, swelling and limitation in mobility. Denies headaches, seizures, numbness, or tingling. Denies depression, anxiety or insomnia. Denies skin break down or rash.        Objective:   Physical Exam Patient alert and oriented and in no cardiopulmonary distress.  HEENT: No facial asymmetry, EOMI, no sinus tenderness,  oropharynx pink and moist.  Neck supple no adenopathy.  Chest: Clear to auscultation bilaterally.  CVS: S1, S2 no murmurs, no S3.  ABD: Soft non tender. Bowel sounds normal.  Ext: No edema  MS: Adequate ROM spine, shoulders, hips and knees.  Skin: Intact, no ulcerations or rash noted.  Psych: Good eye contact, normal affect. Memory intact not anxious or depressed appearing.  CNS: CN 2-12 intact, power, tone and sensation normal throughout.        Assessment & Plan:

## 2011-10-24 NOTE — Assessment & Plan Note (Signed)
Controlled, no change in medication DASH diet and commitment to daily physical activity for a minimum of 30 minutes discussed and encouraged, as a part of hypertension management. The importance of attaining a healthy weight is also discussed.  

## 2011-10-24 NOTE — Assessment & Plan Note (Signed)
Improved. Pt applauded on succesful weight loss through lifestyle change, and encouraged to continue same. Weight loss goal set for the next several months.  

## 2012-01-05 ENCOUNTER — Other Ambulatory Visit (HOSPITAL_COMMUNITY): Payer: Self-pay | Admitting: General Surgery

## 2012-01-05 DIAGNOSIS — Z09 Encounter for follow-up examination after completed treatment for conditions other than malignant neoplasm: Secondary | ICD-10-CM

## 2012-01-05 DIAGNOSIS — Z139 Encounter for screening, unspecified: Secondary | ICD-10-CM

## 2012-01-16 ENCOUNTER — Other Ambulatory Visit: Payer: Self-pay | Admitting: Family Medicine

## 2012-01-18 ENCOUNTER — Ambulatory Visit (HOSPITAL_COMMUNITY): Payer: PRIVATE HEALTH INSURANCE

## 2012-01-27 ENCOUNTER — Ambulatory Visit (HOSPITAL_COMMUNITY): Payer: PRIVATE HEALTH INSURANCE

## 2012-02-03 ENCOUNTER — Ambulatory Visit (HOSPITAL_COMMUNITY): Payer: PRIVATE HEALTH INSURANCE

## 2012-02-15 ENCOUNTER — Other Ambulatory Visit: Payer: Self-pay | Admitting: Family Medicine

## 2012-02-17 ENCOUNTER — Encounter (HOSPITAL_COMMUNITY): Payer: PRIVATE HEALTH INSURANCE

## 2012-02-24 ENCOUNTER — Ambulatory Visit (HOSPITAL_COMMUNITY)
Admission: RE | Admit: 2012-02-24 | Discharge: 2012-02-24 | Disposition: A | Discharge status: Home / Self Care | Payer: PRIVATE HEALTH INSURANCE | Source: Ambulatory Visit | Attending: General Surgery | Admitting: General Surgery

## 2012-02-24 DIAGNOSIS — Z09 Encounter for follow-up examination after completed treatment for conditions other than malignant neoplasm: Secondary | ICD-10-CM | POA: Insufficient documentation

## 2012-03-03 ENCOUNTER — Encounter: Payer: Self-pay | Admitting: Family Medicine

## 2012-03-03 ENCOUNTER — Ambulatory Visit (INDEPENDENT_AMBULATORY_CARE_PROVIDER_SITE_OTHER): Payer: PRIVATE HEALTH INSURANCE | Admitting: Family Medicine

## 2012-03-03 ENCOUNTER — Other Ambulatory Visit: Payer: Self-pay | Admitting: Family Medicine

## 2012-03-03 VITALS — BP 120/82 | HR 84 | Resp 16 | Ht 64.0 in | Wt 163.0 lb

## 2012-03-03 DIAGNOSIS — E663 Overweight: Secondary | ICD-10-CM

## 2012-03-03 DIAGNOSIS — Z1211 Encounter for screening for malignant neoplasm of colon: Secondary | ICD-10-CM

## 2012-03-03 DIAGNOSIS — Z8719 Personal history of other diseases of the digestive system: Secondary | ICD-10-CM

## 2012-03-03 DIAGNOSIS — R7301 Impaired fasting glucose: Secondary | ICD-10-CM

## 2012-03-03 DIAGNOSIS — R0789 Other chest pain: Secondary | ICD-10-CM

## 2012-03-03 DIAGNOSIS — I10 Essential (primary) hypertension: Secondary | ICD-10-CM

## 2012-03-03 DIAGNOSIS — E785 Hyperlipidemia, unspecified: Secondary | ICD-10-CM

## 2012-03-03 DIAGNOSIS — D509 Iron deficiency anemia, unspecified: Secondary | ICD-10-CM

## 2012-03-03 DIAGNOSIS — D259 Leiomyoma of uterus, unspecified: Secondary | ICD-10-CM

## 2012-03-03 NOTE — Progress Notes (Signed)
  Subjective:    Patient ID: Cindy Weiss, female    DOB: 1960-12-30, 51 y.o.   MRN: [id number]  HPI 1 week h/o painful rectal bleeding associated with strainng, even having BM 3 times per day, but straining. C/o episode of chest pain at rest substernal 2 days ago, some light headedness and sweating   Review of Systems See HPI Denies recent fever or chills. Denies sinus pressure, nasal congestion, ear pain or sore throat. Denies chest congestion, productive cough or wheezing. Denies PND , orthopnea palpitations and leg swelling Denies abdominal pain, nausea, vomiting,diarrhea or constipation. No change in BM, had colonoscopy earlier this year  Denies dysuria, frequency, hesitancy or incontinence. Denies joint pain, swelling and limitation in mobility. Denies headaches, seizures, numbness, or tingling. Denies depression, anxiety or insomnia. Denies skin break down or rash.       Objective:   Physical Exam Patient alert and oriented and in no cardiopulmonary distress.  HEENT: No facial asymmetry, EOMI, no sinus tenderness,  oropharynx pink and moist.  Neck supple no adenopathy.  Chest: Clear to auscultation bilaterally.No reproducible chest wall tenderness EKG: nSR , no ischemic change  CVS: S1, S2 no murmurs, no S3.  ABD: Soft non tender. Bowel sounds normal. Rectal: hemorhoids with heme positive stool, no visible tear, no BRRB Ext: No edema  MS: Adequate ROM spine, shoulders, hips and knees.  Skin: Intact, no ulcerations or rash noted.  Psych: Good eye contact, normal affect. Memory intact not anxious or depressed appearing.  CNS: CN 2-12 intact, power, tone and sensation normal throughout.        Assessment & Plan:

## 2012-03-03 NOTE — Patient Instructions (Addendum)
CPE and pap in 2 to 3 month  You will get info to help regulate bowel movements, and you need to strop pushing and straining. Please use daily stool softeners. OTC cream like preparation H will help to reduce inflammation and swelling and therefore rectal bleeding you see also  EKG in office today for chest pain, this normal, no sign of heart damage Hidden blood in stool dur to hemmorhoids  Cbc, fasting lipid, cmp and hBA1C in just before visit  It is important that you exercise regularly at least 30 minutes 5 times a week. If you develop chest pain, have severe difficulty breathing, or feel very tired, stop exercising immediately and seek medical attention  A healthy diet is rich in fruit, vegetables and whole grains. Poultry fish, nuts and beans are a healthy choice for protein rather then red meat. A low sodium diet and drinking 64 ounces of water daily is generally recommended. Oils and sweet should be limited. Carbohydrates especially for those who are diabetic or overweight, should be limited to 30-45 gram per meal. It is important to eat on a regular schedule, at least 3 times daily. Snacks should be primarily fruits, vegetables or nuts.

## 2012-03-09 ENCOUNTER — Telehealth: Payer: Self-pay | Admitting: Family Medicine

## 2012-03-09 ENCOUNTER — Other Ambulatory Visit: Payer: Self-pay | Admitting: Family Medicine

## 2012-03-09 DIAGNOSIS — D219 Benign neoplasm of connective and other soft tissue, unspecified: Secondary | ICD-10-CM

## 2012-03-09 NOTE — Telephone Encounter (Signed)
Patient states that she would like to seek treatment for "fibroids" and would like referral.

## 2012-03-09 NOTE — Telephone Encounter (Signed)
[facility].Please give her appt info if you get it, certainly pls let her  know she has been referred

## 2012-03-13 DIAGNOSIS — Z8719 Personal history of other diseases of the digestive system: Secondary | ICD-10-CM | POA: Insufficient documentation

## 2012-03-13 NOTE — Assessment & Plan Note (Signed)
Deteriorated. Patient re-educated about  the importance of commitment to a  minimum of 150 minutes of exercise per week. The importance of healthy food choices with portion control discussed. Encouraged to start a food diary, count calories and to consider  joining a support group. Sample diet sheets offered. Goals set by the patient for the next several months.    

## 2012-03-13 NOTE — Assessment & Plan Note (Addendum)
Heme positive stool, on exam hemorhoids present , pt educated re steps to take to reduce flare. Will f/u recent colonoscopy

## 2012-03-13 NOTE — Assessment & Plan Note (Signed)
Controlled, no change in medication DASH diet and commitment to daily physical activity for a minimum of 30 minutes discussed and encouraged, as a part of hypertension management. The importance of attaining a healthy weight is also discussed.  

## 2012-03-13 NOTE — Assessment & Plan Note (Signed)
Normal EKG with no ischemia, pt reassured

## 2012-03-13 NOTE — Assessment & Plan Note (Signed)
Multiple fibroids pressing into rectum therefore contributing to the need for pt to push and strain at stool, called back later for referral to gyne, same done

## 2012-03-13 NOTE — Assessment & Plan Note (Signed)
Improved though still uncontrolled. Updated lab prior to next visit , continue meds

## 2012-03-17 ENCOUNTER — Encounter: Payer: Self-pay | Admitting: Family Medicine

## 2012-04-14 ENCOUNTER — Encounter: Payer: PRIVATE HEALTH INSURANCE | Admitting: Family Medicine

## 2012-05-03 ENCOUNTER — Other Ambulatory Visit: Payer: Self-pay | Admitting: Family Medicine

## 2012-05-04 ENCOUNTER — Other Ambulatory Visit: Payer: Self-pay

## 2012-05-04 MED ORDER — AMLODIPINE BESYLATE 2.5 MG PO TABS: ORAL_TABLET | ORAL | Status: DC

## 2012-05-22 LAB — COMPREHENSIVE METABOLIC PANEL
ALT: 23 U/L (ref 0–35)
Albumin: 4.2 g/dL (ref 3.5–5.2)
CO2: 26 mEq/L (ref 19–32)
Calcium: 9.6 mg/dL (ref 8.4–10.5)
Chloride: 107 mEq/L (ref 96–112)
Creat: 0.94 mg/dL (ref 0.50–1.10)
Potassium: 4.9 mEq/L (ref 3.5–5.3)
Total Protein: 6.9 g/dL (ref 6.0–8.3)

## 2012-05-22 LAB — CBC WITH DIFFERENTIAL/PLATELET
Basophils Absolute: 0 10*3/uL (ref 0.0–0.1)
Basophils Relative: 0 % (ref 0–1)
Eosinophils Absolute: 0.1 10*3/uL (ref 0.0–0.7)
HCT: 34.9 % — ABNORMAL LOW (ref 36.0–46.0)
MCHC: 33.2 g/dL (ref 30.0–36.0)
Monocytes Absolute: 0.4 10*3/uL (ref 0.1–1.0)
Neutro Abs: 2.6 10*3/uL (ref 1.7–7.7)
Neutrophils Relative %: 45 % (ref 43–77)
RDW: 16.2 % — ABNORMAL HIGH (ref 11.5–15.5)

## 2012-05-22 LAB — LIPID PANEL
LDL Cholesterol: 122 mg/dL — ABNORMAL HIGH (ref 0–99)
Triglycerides: 30 mg/dL (ref <150)

## 2012-05-22 LAB — HEMOGLOBIN A1C: Hgb A1c MFr Bld: 6.2 % — ABNORMAL HIGH (ref <5.7)

## 2012-05-23 ENCOUNTER — Ambulatory Visit (INDEPENDENT_AMBULATORY_CARE_PROVIDER_SITE_OTHER): Payer: PRIVATE HEALTH INSURANCE | Admitting: Family Medicine

## 2012-05-23 ENCOUNTER — Encounter: Payer: Self-pay | Admitting: Family Medicine

## 2012-05-23 VITALS — BP 138/76 | HR 94 | Resp 18 | Ht 64.0 in | Wt 159.1 lb

## 2012-05-23 DIAGNOSIS — Z111 Encounter for screening for respiratory tuberculosis: Secondary | ICD-10-CM

## 2012-05-23 DIAGNOSIS — I10 Essential (primary) hypertension: Secondary | ICD-10-CM

## 2012-05-23 NOTE — Patient Instructions (Addendum)
F/u in June, please call if you need me before  TB test placed today , you need to return on Wednesday or Thursday to have this read  Fasting lipid, cmp and EGFr and HBa1C and TSH June before visit.  It is important that you exercise regularly at least 30 minutes 5 times a week. If you develop chest pain, have severe difficulty breathing, or feel very tired, stop exercising immediately and seek medical attention   A healthy diet is rich in fruit, vegetables and whole grains. Poultry fish, nuts and beans are a healthy choice for protein rather then red meat. A low sodium diet and drinking 64 ounces of water daily is generally recommended. Oils and sweet should be limited. Carbohydrates especially for those who are diabetic or overweight, should be limited to 34-45 gram per meal. It is important to eat on a regular schedule, at least 3 times daily. Snacks should be primarily fruits, vegetables or nuts.

## 2012-05-24 ENCOUNTER — Other Ambulatory Visit (HOSPITAL_COMMUNITY)
Admission: RE | Admit: 2012-05-24 | Discharge: 2012-05-24 | Disposition: A | Discharge status: Home / Self Care | Payer: PRIVATE HEALTH INSURANCE | Source: Ambulatory Visit | Attending: Family Medicine | Admitting: Family Medicine

## 2012-05-24 DIAGNOSIS — Z1151 Encounter for screening for human papillomavirus (HPV): Secondary | ICD-10-CM | POA: Insufficient documentation

## 2012-05-24 DIAGNOSIS — Z01419 Encounter for gynecological examination (general) (routine) without abnormal findings: Secondary | ICD-10-CM | POA: Insufficient documentation

## 2012-05-24 LAB — POC HEMOCCULT BLD/STL (OFFICE/1-CARD/DIAGNOSTIC)

## 2012-05-27 LAB — FERRITIN: Ferritin: 56 ng/mL (ref 10–291)

## 2012-05-27 LAB — IRON AND TIBC
%SAT: 26 % (ref 20–55)
Iron: 99 ug/dL (ref 42–145)
TIBC: 379 ug/dL (ref 250–470)
UIBC: 280 ug/dL (ref 125–400)

## 2012-05-31 ENCOUNTER — Telehealth: Payer: Self-pay | Admitting: Family Medicine

## 2012-05-31 NOTE — Telephone Encounter (Signed)
Called and left message for patient to return call.  

## 2012-05-31 NOTE — Telephone Encounter (Signed)
Pls let pt know recent labs show improvement in her cholesterol, continue med, cut down on fried and fatty foods and red meat, still higher thanit should be but much better, giove numbers.  Blood sugar avg is high 6.2, she is prediabetic. Give pt ed material, she needs to go to [facility], give info explaining what is predibaetes,  She needs to make and sched 6 month f/u with me, work on weight loss and exercise.  Will need rept fasting lipid, cmp and EGFR and HBA1C at that vist

## 2012-06-02 ENCOUNTER — Encounter: Payer: PRIVATE HEALTH INSURANCE | Admitting: Family Medicine

## 2012-06-02 NOTE — Assessment & Plan Note (Signed)
Patient educated about the importance of limiting  Carbohydrate intake , the need to commit to daily physical activity for a minimum of 30 minutes , and to commit weight loss. The fact that changes in all these areas will reduce or eliminate all together the development of diabetes is stressed.    

## 2012-06-02 NOTE — Assessment & Plan Note (Signed)
Annual exam completed as documented

## 2012-06-02 NOTE — Assessment & Plan Note (Signed)
Improved , though not at goal Hyperlipidemia:Low fat diet discussed and encouraged.

## 2012-06-02 NOTE — Progress Notes (Signed)
  Subjective:    Patient ID: Cindy Weiss, female    DOB: December 19, 1960, 52 y.o.   MRN: [id number]  HPI The PT is here for annual exam and re-evaluation of chronic medical conditions, medication management and review of any available recent lab and radiology data.  Preventive health is updated, specifically  Cancer screening and Immunization.   Questions or concerns regarding consultations or procedures which the PT has had in the interim are  Addressed.Saw gyne by her choice re fibroids, was advised it would decrease in size, she is satisfied The PT denies any adverse reactions to current medications since the last visit.  There are no new concerns. Needs form signed off for suitability to adopt a child, involves physical exam and history. Same done There are no specific complaints       Review of Systems See HPI Denies recent fever or chills. Denies sinus pressure, nasal congestion, ear pain or sore throat. Denies chest congestion, productive cough or wheezing. Denies chest pains, palpitations and leg swelling Denies abdominal pain, nausea, vomiting,diarrhea or constipation.   Denies dysuria, frequency, hesitancy or incontinence. Denies joint pain, swelling and limitation in mobility. Denies headaches, seizures, numbness, or tingling. Denies depression, anxiety or insomnia. Denies skin break down or rash.        Objective:   Physical Exam  Pleasant well nourished female, alert and oriented x 3, in no cardio-pulmonary distress. Afebrile. HEENT No facial trauma or asymetry. Sinuses non tender.  EOMI, PERTL, fundoscopic exam is normal, no hemorhage or exudate.  External ears normal, tympanic membranes clear. Oropharynx moist, no exudate, good dentition. Neck: supple, no adenopathy,JVD or thyromegaly.No bruits.  Chest: Clear to ascultation bilaterally.No crackles or wheezes. Non tender to palpation  Breast: No asymetry,no masses. No nipple discharge or inversion.No  dimpling of skin or skin lesions noted No axillary or supraclavicular adenopathy  Cardiovascular system; Heart sounds normal,  S1 and  S2 ,no S3.  No murmur, or thrill. Apical beat not displaced Peripheral pulses normal.  Abdomen: Soft, non tender, no organomegaly or masses. No bruits. Bowel sounds normal. No guarding, tenderness or rebound.  Rectal:  No mass. Guaiac negative stool.  GU: External genitalia normal. No lesions.Normal female distribution of hair Vaginal canal normal.physiologic discharge. Uterus enlarged, no adnexal masses, no cervical motion or adnexal tenderness.  Musculoskeletal exam: Full ROM of spine, hips , shoulders and knees. No deformity ,swelling or crepitus noted. No muscle wasting or atrophy.   Neurologic: Cranial nerves 2 to 12 intact. Power, tone ,sensation and reflexes normal throughout. No disturbance in gait. No tremor.  Skin: Intact, no ulceration, erythema , scaling or rash noted. Pigmentation normal throughout  Psych; Normal mood and affect. Judgement and concentration normal       Assessment & Plan:

## 2012-06-06 ENCOUNTER — Other Ambulatory Visit: Payer: Self-pay

## 2012-06-06 MED ORDER — FENOFIBRIC ACID 135 MG PO CPDR: 135.0000 mg | DELAYED_RELEASE_CAPSULE | Freq: Every day | ORAL | Status: DC

## 2012-06-06 MED ORDER — AMLODIPINE BESYLATE 2.5 MG PO TABS: ORAL_TABLET | ORAL | Status: DC

## 2012-06-06 NOTE — Addendum Note (Signed)
Addended by: Kandis Fantasia B on: 06/06/2012 08:37 AM   Modules accepted: Orders

## 2012-06-06 NOTE — Telephone Encounter (Signed)
Patient aware and will stop by office.

## 2012-06-21 ENCOUNTER — Encounter: Payer: PRIVATE HEALTH INSURANCE | Admitting: Family Medicine

## 2012-07-19 ENCOUNTER — Other Ambulatory Visit (HOSPITAL_COMMUNITY): Payer: Self-pay | Admitting: General Surgery

## 2012-07-19 DIAGNOSIS — Z139 Encounter for screening, unspecified: Secondary | ICD-10-CM

## 2012-08-01 ENCOUNTER — Ambulatory Visit (HOSPITAL_COMMUNITY)
Admission: RE | Admit: 2012-08-01 | Discharge: 2012-08-01 | Disposition: A | Discharge status: Home / Self Care | Payer: PRIVATE HEALTH INSURANCE | Source: Ambulatory Visit | Attending: General Surgery | Admitting: General Surgery

## 2012-08-01 DIAGNOSIS — Z139 Encounter for screening, unspecified: Secondary | ICD-10-CM

## 2012-08-01 DIAGNOSIS — Z1231 Encounter for screening mammogram for malignant neoplasm of breast: Secondary | ICD-10-CM | POA: Insufficient documentation

## 2012-08-23 ENCOUNTER — Ambulatory Visit: Payer: PRIVATE HEALTH INSURANCE | Admitting: Family Medicine

## 2012-08-30 LAB — COMPLETE METABOLIC PANEL WITH GFR
BUN: 20 mg/dL (ref 6–23)
CO2: 25 mEq/L (ref 19–32)
Calcium: 10.3 mg/dL (ref 8.4–10.5)
Chloride: 102 mEq/L (ref 96–112)
Creat: 0.88 mg/dL (ref 0.50–1.10)
GFR, Est African American: 87 mL/min
Glucose, Bld: 72 mg/dL (ref 70–99)

## 2012-08-30 LAB — LIPID PANEL
Cholesterol: 226 mg/dL — ABNORMAL HIGH (ref 0–200)
HDL: 92 mg/dL (ref >39)
Total CHOL/HDL Ratio: 2.5 Ratio
Triglycerides: 38 mg/dL (ref <150)

## 2012-08-30 LAB — HEMOGLOBIN A1C: Hgb A1c MFr Bld: 5.8 % — ABNORMAL HIGH (ref <5.7)

## 2012-08-31 ENCOUNTER — Ambulatory Visit (INDEPENDENT_AMBULATORY_CARE_PROVIDER_SITE_OTHER): Payer: PRIVATE HEALTH INSURANCE | Admitting: Family Medicine

## 2012-08-31 ENCOUNTER — Encounter: Payer: Self-pay | Admitting: Family Medicine

## 2012-08-31 VITALS — BP 128/78 | HR 72 | Resp 16 | Ht 64.0 in | Wt 157.0 lb

## 2012-08-31 DIAGNOSIS — R7303 Prediabetes: Secondary | ICD-10-CM

## 2012-08-31 DIAGNOSIS — I10 Essential (primary) hypertension: Secondary | ICD-10-CM

## 2012-08-31 DIAGNOSIS — R0789 Other chest pain: Secondary | ICD-10-CM

## 2012-08-31 DIAGNOSIS — E663 Overweight: Secondary | ICD-10-CM

## 2012-08-31 DIAGNOSIS — E785 Hyperlipidemia, unspecified: Secondary | ICD-10-CM

## 2012-08-31 DIAGNOSIS — R7309 Other abnormal glucose: Secondary | ICD-10-CM

## 2012-08-31 NOTE — Progress Notes (Signed)
  Subjective:    Patient ID: Cindy Weiss, female    DOB: 05-13-1960, 52 y.o.   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 Increased financial stress, car out of order, also emotionally challenged, not much progress with adoption, spouse still needs to fill out his forms   Review of Systems See HPI Denies recent fever or chills. Denies sinus pressure, nasal congestion, ear pain or sore throat. Denies chest congestion, productive cough or wheezing. Denies PND, orthopnea, palpitations and leg swelling Denies abdominal pain, nausea, vomiting,diarrhea or constipation.   Denies dysuria, frequency, hesitancy or incontinence.  Denies headaches, seizures, numbness, or tingling. Increase stress , sleep is ok Denies skin break down or rash.        Objective:   Physical Exam Patient alert and oriented and in no cardiopulmonary distress.  HEENT: No facial asymmetry, EOMI, no sinus tenderness,  oropharynx pink and moist.  Neck supple no adenopathy.  Chest: Clear to auscultation bilaterally.No reproducible chest wall pain to palpation, however upper body movement limited by pain  CVS: S1, S2 no murmurs, no S3.  ABD: Soft non tender. Bowel sounds normal.  Ext: No edema  MS: Adequate ROM spine, shoulders, hips and knees.  Skin: Intact, no ulcerations or rash noted.  Psych: Good eye contact, normal affect. Memory intact mildly anxious not depressed appearing.  CNS: CN 2-12 intact, power, tone and sensation normal throughout.        Assessment & Plan:

## 2012-08-31 NOTE — Patient Instructions (Addendum)
F/u early December, call if you need me before   Fasting lipid, cmp and HBA1C in early December before visit  Call in October for your flu vaccine  It is important that you exercise regularly at least 30 minutes 5 times a week. If you develop chest pain, have severe difficulty breathing, or feel very tired, stop exercising immediately and seek medical attention   A healthy diet is rich in fruit, vegetables and whole grains. Poultry fish, nuts and beans are a healthy choice for protein rather then red meat. A low sodium diet and drinking 64 ounces of water daily is generally recommended. Oils and sweet should be limited. Carbohydrates especially for those who are diabetic or overweight, should be limited to 30-45 gram per meal. It is important to eat on a regular schedule, at least 3 times daily. Snacks should be primarily fruits, vegetables or nuts.  Blood pressure is excellent  Cholesterol is high , you need to take the medication prescribed  Weight goal 150lb in 6 months  OK to take tylenol 1 tablet , if neded , for chest or back pain  Skimmed milk is the best to drink

## 2012-09-04 ENCOUNTER — Telehealth: Payer: Self-pay | Admitting: Family Medicine

## 2012-09-04 NOTE — Assessment & Plan Note (Signed)
Improved. Pt applauded on succesful weight loss through lifestyle change, and encouraged to continue same. Weight loss goal set for the next several months.  

## 2012-09-04 NOTE — Assessment & Plan Note (Signed)
Uncontrolled, pt to take med as prescribed and follow low fat diet

## 2012-09-04 NOTE — Assessment & Plan Note (Addendum)
HBA1C is 5.8 in 07/2012, improved  Patient educated about the importance of limiting  Carbohydrate intake , the need to commit to daily physical activity for a minimum of 30 minutes , and to commit weight loss. The fact that changes in all these areas will reduce or eliminate all together the development of diabetes is stressed.

## 2012-09-04 NOTE — Telephone Encounter (Signed)
Spouse called early on Sunday morning, 09/04/2012, concerned spouse not eating and feeling weak. Stating she does not feel well. I specifically questioned pt, all answers were negative, no fever , chills, head or chest congestion , no urinary symptoms. Reported that she did not feel like eating much and had eliminated all carbs to lose weight. I advised this was not expected, that she needed to eat carbs but limit the amt, stated she would do this. I advised she would feel weak and ill if she did not eat. Advised Ed eval if symptoms worsened

## 2012-09-04 NOTE — Assessment & Plan Note (Signed)
Controlled, no change in medication DASH diet and commitment to daily physical activity for a minimum of 30 minutes discussed and encouraged, as a part of hypertension management. The importance of attaining a healthy weight is also discussed.  

## 2012-09-04 NOTE — Assessment & Plan Note (Addendum)
Intermittent chest paub with upper body movement.  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

## 2012-09-15 ENCOUNTER — Other Ambulatory Visit: Payer: Self-pay

## 2012-09-15 ENCOUNTER — Ambulatory Visit: Payer: PRIVATE HEALTH INSURANCE | Admitting: Family Medicine

## 2012-09-15 MED ORDER — AMLODIPINE BESYLATE 2.5 MG PO TABS: ORAL_TABLET | ORAL | Status: DC

## 2012-11-08 ENCOUNTER — Ambulatory Visit: Payer: PRIVATE HEALTH INSURANCE | Admitting: Family Medicine

## 2012-11-08 ENCOUNTER — Ambulatory Visit: Payer: Self-pay | Admitting: Family Medicine

## 2012-11-09 ENCOUNTER — Ambulatory Visit: Payer: PRIVATE HEALTH INSURANCE | Admitting: Family Medicine

## 2013-03-01 ENCOUNTER — Other Ambulatory Visit: Payer: Self-pay | Admitting: Family Medicine

## 2013-03-01 LAB — COMPREHENSIVE METABOLIC PANEL
AST: 22 U/L (ref 0–37)
Albumin: 4.6 g/dL (ref 3.5–5.2)
Alkaline Phosphatase: 71 U/L (ref 39–117)
BUN: 15 mg/dL (ref 6–23)
Calcium: 9.9 mg/dL (ref 8.4–10.5)
Chloride: 102 mEq/L (ref 96–112)
Creat: 0.76 mg/dL (ref 0.50–1.10)
Glucose, Bld: 63 mg/dL — ABNORMAL LOW (ref 70–99)
Potassium: 4.1 mEq/L (ref 3.5–5.3)

## 2013-03-01 LAB — LIPID PANEL
HDL: 92 mg/dL (ref >39)
Total CHOL/HDL Ratio: 2.4 Ratio
Triglycerides: 37 mg/dL (ref <150)

## 2013-03-07 ENCOUNTER — Encounter (INDEPENDENT_AMBULATORY_CARE_PROVIDER_SITE_OTHER): Payer: Self-pay

## 2013-03-07 ENCOUNTER — Ambulatory Visit (INDEPENDENT_AMBULATORY_CARE_PROVIDER_SITE_OTHER): Payer: PRIVATE HEALTH INSURANCE | Admitting: Family Medicine

## 2013-03-07 ENCOUNTER — Encounter: Payer: Self-pay | Admitting: Family Medicine

## 2013-03-07 VITALS — BP 120/82 | HR 74 | Resp 18 | Ht 64.0 in | Wt 157.0 lb

## 2013-03-07 DIAGNOSIS — E663 Overweight: Secondary | ICD-10-CM

## 2013-03-07 DIAGNOSIS — R7303 Prediabetes: Secondary | ICD-10-CM

## 2013-03-07 DIAGNOSIS — R7309 Other abnormal glucose: Secondary | ICD-10-CM

## 2013-03-07 DIAGNOSIS — E785 Hyperlipidemia, unspecified: Secondary | ICD-10-CM

## 2013-03-07 DIAGNOSIS — I10 Essential (primary) hypertension: Secondary | ICD-10-CM

## 2013-03-07 MED ORDER — AMLODIPINE BESYLATE 2.5 MG PO TABS: ORAL_TABLET | ORAL | Status: DC

## 2013-03-07 MED ORDER — FENOFIBRIC ACID 135 MG PO CPDR: 135.0000 mg | DELAYED_RELEASE_CAPSULE | Freq: Every day | ORAL | Status: DC

## 2013-03-07 NOTE — Progress Notes (Signed)
   Subjective:    Patient ID: Cindy Weiss, female    DOB: September 24, 1960, 52 y.o.   MRN: [id number]  HPI The PT is here for follow up and re-evaluation of chronic medical conditions, medication management and review of any available recent lab and radiology data.  Preventive health is updated, specifically  Cancer screening and Immunization.   Questions or concerns regarding consultations or procedures which the PT has had in the interim are  addressed. The PT denies any adverse reactions to current medications since the last visit.  There are no new concerns.  There are no specific complaints       Review of Systems See HPI Denies recent fever or chills. Denies sinus pressure, nasal congestion, ear pain or sore throat. Denies chest congestion, productive cough or wheezing. Denies chest pains, palpitations and leg swelling Denies abdominal pain, nausea, vomiting,diarrhea or constipation.   Denies dysuria, frequency, hesitancy or incontinence. Denies joint pain, swelling and limitation in mobility. Denies headaches, seizures, numbness, or tingling. Denies depression, anxiety or insomnia. Denies skin break down or rash.        Objective:   Physical Exam  Patient alert and oriented and in no cardiopulmonary distress.  HEENT: No facial asymmetry, EOMI, no sinus tenderness,  oropharynx pink and moist.  Neck supple no adenopathy.  Chest: Clear to auscultation bilaterally.  CVS: S1, S2 no murmurs, no S3.  ABD: Soft non tender. Bowel sounds normal.  Ext: No edema  MS: Adequate ROM spine, shoulders, hips and knees.  Skin: Intact, no ulcerations or rash noted.  Psych: Good eye contact, normal affect. Memory intact not anxious or depressed appearing.  CNS: CN 2-12 intact, power, tone and sensation normal throughout.       Assessment & Plan:

## 2013-03-07 NOTE — Patient Instructions (Addendum)
CPE and pap in 5.5 month, call if you need me before  Fasting lipid, cmp , HBa1, cBc and TSH in 5.5 month  It is important that you exercise regularly at least 30 minutes 5 times a week. If you develop chest pain, have severe difficulty breathing, or feel very tired, stop exercising immediately and seek medical attention    .Please take cholesterol medication every day as prescribed, this is still  High  Blood sugar is creeping up, so commit to daily exercise 30 mins, and avoid sweets and restrict carbohydrate intake

## 2013-03-12 NOTE — Assessment & Plan Note (Addendum)
Improved, but still not at goal Hyperlipidemia:Low fat diet discussed and encouraged.  No med change, but pt has not been takinjg as prescribed, educated again re the need to do this

## 2013-03-12 NOTE — Assessment & Plan Note (Signed)
Controlled, no change in medication DASH diet and commitment to daily physical activity for a minimum of 30 minutes discussed and encouraged, as a part of hypertension management. The importance of attaining a healthy weight is also discussed.  

## 2013-03-12 NOTE — Assessment & Plan Note (Signed)
Deteriorated Patient educated about the importance of limiting  Carbohydrate intake , the need to commit to daily physical activity for a minimum of 30 minutes , and to commit weight loss. The fact that changes in all these areas will reduce or eliminate all together the development of diabetes is stressed.    

## 2013-03-12 NOTE — Assessment & Plan Note (Signed)
Unchanged. Patient re-educated about  the importance of commitment to a  minimum of 150 minutes of exercise per week. The importance of healthy food choices with portion control discussed. Encouraged to start a food diary, count calories and to consider  joining a support group. Sample diet sheets offered. Goals set by the patient for the next several months.    

## 2013-05-30 ENCOUNTER — Other Ambulatory Visit (HOSPITAL_COMMUNITY): Payer: Self-pay | Admitting: General Surgery

## 2013-05-30 DIAGNOSIS — Z1231 Encounter for screening mammogram for malignant neoplasm of breast: Secondary | ICD-10-CM

## 2013-06-16 ENCOUNTER — Other Ambulatory Visit: Payer: Self-pay

## 2013-06-16 MED ORDER — FENOFIBRIC ACID 135 MG PO CPDR: 135.0000 mg | DELAYED_RELEASE_CAPSULE | Freq: Every day | ORAL | Status: DC

## 2013-06-16 MED ORDER — AMLODIPINE BESYLATE 2.5 MG PO TABS: ORAL_TABLET | ORAL | Status: DC

## 2013-08-10 LAB — HEMOGLOBIN A1C
Hgb A1c MFr Bld: 6.3 % — ABNORMAL HIGH (ref <5.7)
Mean Plasma Glucose: 134 mg/dL — ABNORMAL HIGH (ref <117)

## 2013-08-10 LAB — CBC
HCT: 36 % (ref 36.0–46.0)
Hemoglobin: 12.2 g/dL (ref 12.0–15.0)
MCH: 22.4 pg — ABNORMAL LOW (ref 26.0–34.0)
MCHC: 33.9 g/dL (ref 30.0–36.0)
MCV: 66.1 fL — ABNORMAL LOW (ref 78.0–100.0)
Platelets: 305 10*3/uL (ref 150–400)
RBC: 5.45 MIL/uL — ABNORMAL HIGH (ref 3.87–5.11)
RDW: 14.7 % (ref 11.5–15.5)
WBC: 5.9 10*3/uL (ref 4.0–10.5)

## 2013-08-11 LAB — COMPREHENSIVE METABOLIC PANEL
ALT: 34 U/L (ref 0–35)
AST: 27 U/L (ref 0–37)
Albumin: 4.7 g/dL (ref 3.5–5.2)
Alkaline Phosphatase: 64 U/L (ref 39–117)
BUN: 17 mg/dL (ref 6–23)
CO2: 29 mEq/L (ref 19–32)
Calcium: 10.4 mg/dL (ref 8.4–10.5)
Chloride: 102 mEq/L (ref 96–112)
Creat: 0.9 mg/dL (ref 0.50–1.10)
Glucose, Bld: 96 mg/dL (ref 70–99)
Potassium: 4.1 mEq/L (ref 3.5–5.3)
Sodium: 139 mEq/L (ref 135–145)
Total Bilirubin: 0.4 mg/dL (ref 0.2–1.2)
Total Protein: 7.6 g/dL (ref 6.0–8.3)

## 2013-08-11 LAB — LIPID PANEL
Cholesterol: 195 mg/dL (ref 0–200)
HDL: 94 mg/dL (ref >39)
LDL Cholesterol: 95 mg/dL (ref 0–99)
Total CHOL/HDL Ratio: 2.1 Ratio
Triglycerides: 32 mg/dL (ref <150)
VLDL: 6 mg/dL (ref 0–40)

## 2013-08-11 LAB — TSH: TSH: 0.866 u[IU]/mL (ref 0.350–4.500)

## 2013-08-15 ENCOUNTER — Ambulatory Visit (HOSPITAL_COMMUNITY)
Admission: RE | Admit: 2013-08-15 | Discharge: 2013-08-15 | Disposition: A | Discharge status: Home / Self Care | Payer: PRIVATE HEALTH INSURANCE | Source: Ambulatory Visit | Attending: General Surgery | Admitting: General Surgery

## 2013-08-15 ENCOUNTER — Encounter: Payer: Self-pay | Admitting: Family Medicine

## 2013-08-15 ENCOUNTER — Other Ambulatory Visit (HOSPITAL_COMMUNITY)
Admission: RE | Admit: 2013-08-15 | Discharge: 2013-08-15 | Disposition: A | Discharge status: Home / Self Care | Payer: PRIVATE HEALTH INSURANCE | Source: Ambulatory Visit | Attending: Family Medicine | Admitting: Family Medicine

## 2013-08-15 ENCOUNTER — Ambulatory Visit (INDEPENDENT_AMBULATORY_CARE_PROVIDER_SITE_OTHER): Payer: PRIVATE HEALTH INSURANCE | Admitting: Family Medicine

## 2013-08-15 VITALS — BP 134/82 | HR 69 | Resp 16 | Wt 158.0 lb

## 2013-08-15 DIAGNOSIS — Z1211 Encounter for screening for malignant neoplasm of colon: Secondary | ICD-10-CM

## 2013-08-15 DIAGNOSIS — Z01419 Encounter for gynecological examination (general) (routine) without abnormal findings: Secondary | ICD-10-CM | POA: Insufficient documentation

## 2013-08-15 DIAGNOSIS — R7303 Prediabetes: Secondary | ICD-10-CM

## 2013-08-15 DIAGNOSIS — Z1231 Encounter for screening mammogram for malignant neoplasm of breast: Secondary | ICD-10-CM | POA: Insufficient documentation

## 2013-08-15 DIAGNOSIS — L989 Disorder of the skin and subcutaneous tissue, unspecified: Secondary | ICD-10-CM

## 2013-08-15 DIAGNOSIS — Z0000 Encounter for general adult medical examination without abnormal findings: Secondary | ICD-10-CM

## 2013-08-15 DIAGNOSIS — Z124 Encounter for screening for malignant neoplasm of cervix: Secondary | ICD-10-CM

## 2013-08-15 LAB — POC HEMOCCULT BLD/STL (OFFICE/1-CARD/DIAGNOSTIC): FECAL OCCULT BLD: NEGATIVE

## 2013-08-15 NOTE — Patient Instructions (Addendum)
F/u in 4.5 month call if you need me before  Pap sent  I will  Re evaluate skin lesion on right upper back next visit.If it itches, bleeds, increases in size quickly or has a lot of color change with darkening, you need to see dermatology   HBa1C and chem 7 non fast in 4.5 month, blood sugar slightly higher so pLEASE continue to watch you carbs

## 2013-09-06 NOTE — Progress Notes (Signed)
[facility] Diabetes Class Completion  Date:September 07, 2013  Time: 1730  Pt attended [facility] Education Class on September 07, 2013.   Patient was educated on the following topics:   -Survival skills (signs and symptoms of hyperglycemia and hypoglycemia, treatment for hypoglycemia, ideal levels for fasting and postprandial blood sugars, goal Hgb A1c level, foot care basics)  -Recommendations for physical activity   -Carbohydrate metabolism in relation to diabetes   -Meal planning (sources of carbohydrate, carbohydrate counting, meal planning strategies, food label reading, and portion control).  Handouts provided:  -"Diabetes and You: Taking Charge of Your Health"  -"Carbohydrate Counting and Meal Planning"  -"Your Guide to Better Office Visits"   Lessie Manigo A. Aarica Wax, RD, LDN  

## 2013-09-07 ENCOUNTER — Encounter (HOSPITAL_COMMUNITY): Payer: Self-pay | Admitting: Dietician

## 2013-09-30 NOTE — Progress Notes (Signed)
   Subjective:    Patient ID: Cindy Weiss, female    DOB: 09-29-1960, 53 y.o.   MRN: [id number]  HPI Ptr in for annual physical exam No health concerns voiced  at this visit. Committed to healthy lifestyle with regular exercise and healthy diet   Review of Systems See HPI     Objective:   Physical Exam BP 134/82  Pulse 69  Resp 16  Wt 158 lb (71.668 kg)  SpO2 100% Pleasant well nourished female, alert and oriented x 3, in no cardio-pulmonary distress. Afebrile. HEENT No facial trauma or asymetry. Sinuses non tender.  EOMI, PERTL, fundoscopic exam  no hemorhage or exudate.  External ears normal, tympanic membranes clear. Oropharynx moist, no exudate, good dentition. Neck: supple, no adenopathy,JVD or thyromegaly.No bruits.  Chest: Clear to ascultation bilaterally.No crackles or wheezes. Non tender to palpation  Breast: No asymetry,no masses or lumps. No tenderness. No nipple discharge or inversion. No axillary or supraclavicular adenopathy  Cardiovascular system; Heart sounds normal,  S1 and  S2 ,no S3.  No murmur, or thrill. Apical beat not displaced Peripheral pulses normal.  Abdomen: Soft, non tender, no organomegaly or masses. No bruits. Bowel sounds normal. No guarding, tenderness or rebound.  Rectal:  Normal sphincter tone. No mass.No rectal masses.  Guaiac negative stool.  GU: External genitalia normal female genitalia , female distribution of hair. No lesions. Urethral meatus normal in size, no  Prolapse, no lesions visibly  Present. Bladder non tender. Vagina pink and moist , with no visible lesions , discharge present . Adequate pelvic support no  cystocele or rectocele noted Cervix pink and appears healthy, no lesions or ulcerations noted, no discharge noted from os Uterus normal size, no adnexal masses, no cervical motion or adnexal tenderness.   Musculoskeletal exam: Full ROM of spine, hips , shoulders and knees. No deformity ,swelling  or crepitus noted. No muscle wasting or atrophy.   Neurologic: Cranial nerves 2 to 12 intact. Power, tone ,sensation and reflexes normal throughout. No disturbance in gait. No tremor.  Skin: Intact, no ulceration, erythema , scaling or rash noted. Pigmentation normal throughout  Psych; Normal mood and affect. Judgement and concentration normal        Assessment & Plan:  Routine general medical examination at a health care facility Annual exam as documented. Counseling done  re healthy lifestyle involving commitment to 150 minutes exercise per week, heart healthy diet, and attaining healthy weight.The importance of adequate sleep also discussed. Regular seat belt use , is also discussed. Changes in health habits are decided on by the patient with goals and time frames  set for achieving them. Immunization and cancer screening needs are specifically addressed at this visit.

## 2013-09-30 NOTE — Assessment & Plan Note (Signed)
Annual exam as documented. Counseling done  re healthy lifestyle involving commitment to 150 minutes exercise per week, heart healthy diet, and attaining healthy weight.The importance of adequate sleep also discussed. Regular seat belt use , is also discussed. Changes in health habits are decided on by the patient with goals and time frames  set for achieving them. Immunization and cancer screening needs are specifically addressed at this visit.

## 2014-01-02 ENCOUNTER — Ambulatory Visit: Payer: PRIVATE HEALTH INSURANCE | Admitting: Family Medicine

## 2014-01-13 ENCOUNTER — Other Ambulatory Visit: Payer: Self-pay | Admitting: Family Medicine

## 2014-01-13 LAB — HEMOGLOBIN A1C
Hgb A1c MFr Bld: 6.1 % — ABNORMAL HIGH (ref <5.7)
Mean Plasma Glucose: 128 mg/dL — ABNORMAL HIGH (ref <117)

## 2014-01-14 LAB — BASIC METABOLIC PANEL
BUN: 18 mg/dL (ref 6–23)
CO2: 27 meq/L (ref 19–32)
Calcium: 9.9 mg/dL (ref 8.4–10.5)
Chloride: 106 mEq/L (ref 96–112)
Creat: 0.83 mg/dL (ref 0.50–1.10)
GLUCOSE: 89 mg/dL (ref 70–99)
POTASSIUM: 4.8 meq/L (ref 3.5–5.3)
Sodium: 140 mEq/L (ref 135–145)

## 2014-01-15 LAB — HEPATIC FUNCTION PANEL
ALT: 23 U/L (ref 0–35)
AST: 23 U/L (ref 0–37)
Albumin: 4.4 g/dL (ref 3.5–5.2)
Alkaline Phosphatase: 62 U/L (ref 39–117)
BILIRUBIN INDIRECT: 0.3 mg/dL (ref 0.2–1.2)
Bilirubin, Direct: 0.1 mg/dL (ref 0.0–0.3)
Total Bilirubin: 0.4 mg/dL (ref 0.2–1.2)
Total Protein: 7.5 g/dL (ref 6.0–8.3)

## 2014-01-15 LAB — LIPID PANEL
CHOLESTEROL: 199 mg/dL (ref 0–200)
HDL: 89 mg/dL (ref >39)
LDL CALC: 102 mg/dL — AB (ref 0–99)
Total CHOL/HDL Ratio: 2.2 Ratio
Triglycerides: 42 mg/dL (ref <150)
VLDL: 8 mg/dL (ref 0–40)

## 2014-01-16 ENCOUNTER — Encounter (INDEPENDENT_AMBULATORY_CARE_PROVIDER_SITE_OTHER): Payer: Self-pay

## 2014-01-16 ENCOUNTER — Ambulatory Visit (INDEPENDENT_AMBULATORY_CARE_PROVIDER_SITE_OTHER): Payer: PRIVATE HEALTH INSURANCE | Admitting: Family Medicine

## 2014-01-16 ENCOUNTER — Encounter: Payer: Self-pay | Admitting: Family Medicine

## 2014-01-16 VITALS — BP 126/82 | HR 85 | Resp 16 | Ht 64.0 in | Wt 163.0 lb

## 2014-01-16 DIAGNOSIS — I10 Essential (primary) hypertension: Secondary | ICD-10-CM

## 2014-01-16 DIAGNOSIS — E663 Overweight: Secondary | ICD-10-CM

## 2014-01-16 DIAGNOSIS — Z23 Encounter for immunization: Secondary | ICD-10-CM | POA: Insufficient documentation

## 2014-01-16 DIAGNOSIS — R7309 Other abnormal glucose: Secondary | ICD-10-CM

## 2014-01-16 DIAGNOSIS — E785 Hyperlipidemia, unspecified: Secondary | ICD-10-CM

## 2014-01-16 DIAGNOSIS — R7303 Prediabetes: Secondary | ICD-10-CM

## 2014-01-16 MED ORDER — FENOFIBRIC ACID 135 MG PO CPDR
135.0000 mg | DELAYED_RELEASE_CAPSULE | Freq: Every day | ORAL | Status: DC
Start: 2014-01-16 — End: 2014-07-19

## 2014-01-16 MED ORDER — AMLODIPINE BESYLATE 2.5 MG PO TABS: ORAL_TABLET | ORAL | Status: DC

## 2014-01-16 NOTE — Assessment & Plan Note (Addendum)
Slight deterioration, lDL above 100, no change in medication Hyperlipidemia:Low fat diet discussed and encouraged.

## 2014-01-16 NOTE — Patient Instructions (Addendum)
CPE and pap May 50 or after, call if you need me before  Fasting lipid, cmp and HBA1C in  4.5 months   Labs are excellent, continue to watch your sweets and carbs  Flu vaccine today  Blood pressure is excellent, and so is your cholesterol liver and kidney  Blood sugar is better but stiil higher than it should be  It is important that you exercise regularly at least 30 minutes 5 times a week. If you develop chest pain, have severe difficulty breathing, or feel very tired, stop exercising immediately and seek medical attention    A healthy diet is rich in fruit, vegetables and whole grains. Poultry fish, nuts and beans are a healthy choice for protein rather then red meat. A low sodium diet and drinking 64 ounces of water daily is generally recommended. Oils and sweet should be limited. Carbohydrates especially for those who are diabetic or overweight, should be limited to 45 to 60 gram per meal. It is important to eat on a regular schedule, at least 3 times daily. Snacks should be primarily fruits, vegetables or nuts.

## 2014-01-16 NOTE — Assessment & Plan Note (Signed)
Unchnaged. Patient re-educated about  the importance of commitment to a  minimum of 150 minutes of exercise per week. The importance of healthy food choices with portion control discussed. Encouraged to start a food diary, count calories and to consider  joining a support group. Sample diet sheets offered. Goals set by the patient for the next several months.    

## 2014-01-16 NOTE — Assessment & Plan Note (Signed)
improved since last checked, in the past 4 months, pt applauded on this Patient educated about the importance of limiting  Carbohydrate intake , the need to commit to daily physical activity for a minimum of 30 minutes , and to commit weight loss. The fact that changes in all these areas will reduce or eliminate all together the development of diabetes is stressed.

## 2014-01-16 NOTE — Assessment & Plan Note (Signed)
Vaccine administered at visit.  

## 2014-01-16 NOTE — Progress Notes (Signed)
   Subjective:    Patient ID: Cindy Weiss, female    DOB: 1960/04/03, 53 y.o.   MRN: [id number]  HPI The PT is here for follow up and re-evaluation of chronic medical conditions, medication management and review of any available recent lab and radiology data.  Preventive health is updated, specifically  Cancer screening and Immunization.    The PT denies any adverse reactions to current medications since the last visit.  There are no new concerns.  There are no specific complaints       Review of Systems See HPI Denies recent fever or chills. Denies sinus pressure, nasal congestion, ear pain or sore throat. Denies chest congestion, productive cough or wheezing. Denies chest pains, palpitations and leg swelling Denies abdominal pain, nausea, vomiting,diarrhea or constipation.   Denies dysuria, frequency, hesitancy or incontinence. Denies joint pain, swelling and limitation in mobility. Denies headaches, seizures, numbness, or tingling. Denies depression, anxiety or insomnia. Denies skin break down or rash.        Objective:   Physical Exam   BP 126/82  Pulse 85  Resp 16  Ht 5\' 4"  (1.626 m)  Wt 163 lb (73.936 kg)  BMI 27.97 kg/m2  SpO2 100% Patient alert and oriented and in no cardiopulmonary distress.  HEENT: No facial asymmetry, EOMI,   oropharynx pink and moist.  Neck supple no JVD, no mass.  Chest: Clear to auscultation bilaterally.  CVS: S1, S2 no murmurs, no S3.Regular rate.  ABD: Soft non tender.   Ext: No edema  MS: Adequate ROM spine, shoulders, hips and knees.  Skin: Intact, no ulcerations or rash noted.  Psych: Good eye contact, normal affect. Memory intact not anxious or depressed appearing.  CNS: CN 2-12 intact, power,  normal throughout.no focal deficits noted.      Assessment & Plan:  HTN (hypertension) Controlled, no change in medication DASH diet and commitment to daily physical activity for a minimum of 30 minutes discussed and  encouraged, as a part of hypertension management. The importance of attaining a healthy weight is also discussed.   Prediabetes improved since last checked, in the past 4 months, pt applauded on this Patient educated about the importance of limiting  Carbohydrate intake , the need to commit to daily physical activity for a minimum of 30 minutes , and to commit weight loss. The fact that changes in all these areas will reduce or eliminate all together the development of diabetes is stressed.     Hyperlipidemia Slight deterioration, lDL above 100, no change in medication Hyperlipidemia:Low fat diet discussed and encouraged.    Overweight Unchnaged Patient re-educated about  the importance of commitment to a  minimum of 150 minutes of exercise per week. The importance of healthy food choices with portion control discussed. Encouraged to start a food diary, count calories and to consider  joining a support group. Sample diet sheets offered. Goals set by the patient for the next several months.     Need for prophylactic vaccination and inoculation against influenza Vaccine administered at visit.

## 2014-01-16 NOTE — Assessment & Plan Note (Signed)
Controlled, no change in medication DASH diet and commitment to daily physical activity for a minimum of 30 minutes discussed and encouraged, as a part of hypertension management. The importance of attaining a healthy weight is also discussed.  

## 2014-02-13 ENCOUNTER — Other Ambulatory Visit: Payer: Self-pay | Admitting: Family Medicine

## 2014-02-22 IMAGING — MG MM DIGITAL DIAGNOSTIC UNILAT R
5 series · 5 of 5 positions shown · non-contrast
Comparison: 07/29/2011, 07/13/2011, 07/07/2010, 07/05/2009,
06/29/2008

CLINICAL DATA: The patient returns for short interval follow-up of
the parenchymal pattern of the right breast.

DIGITAL DIAGNOSTIC RIGHT MAMMOGRAM WITH CAD

[R CC (1 of 2)]
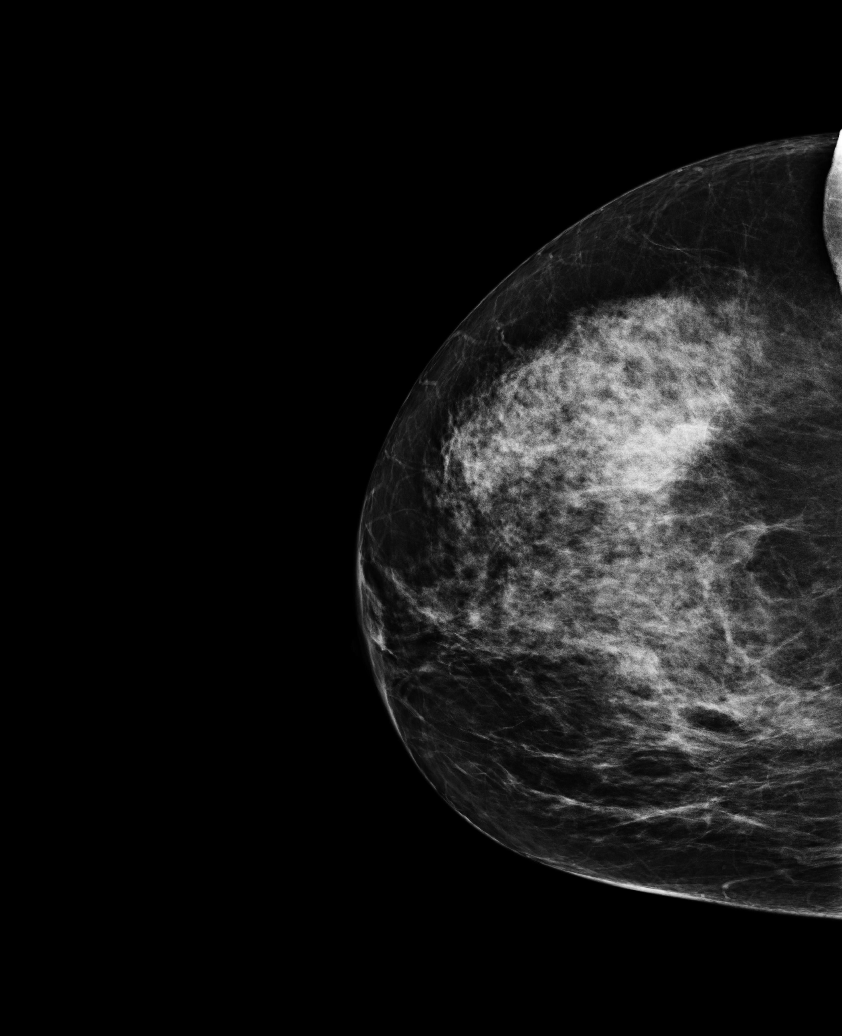

[R MLO (1 of 3)]
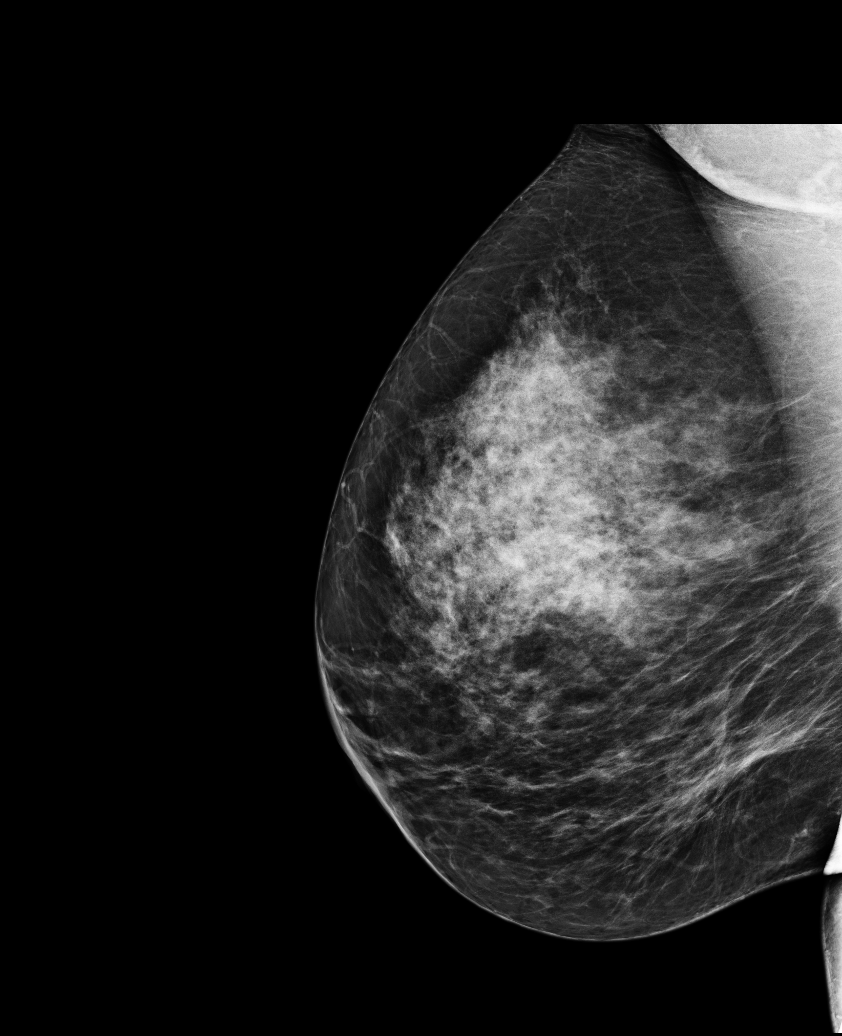

[R CC (2 of 2)]
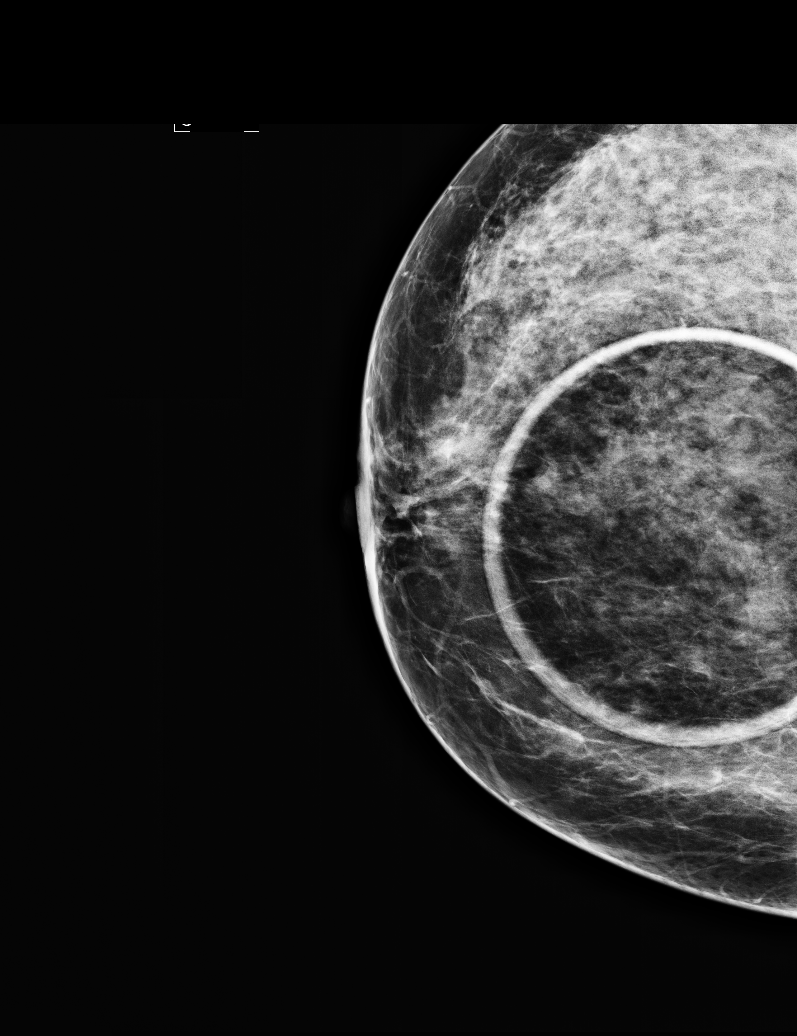

[R MLO (2 of 3)]
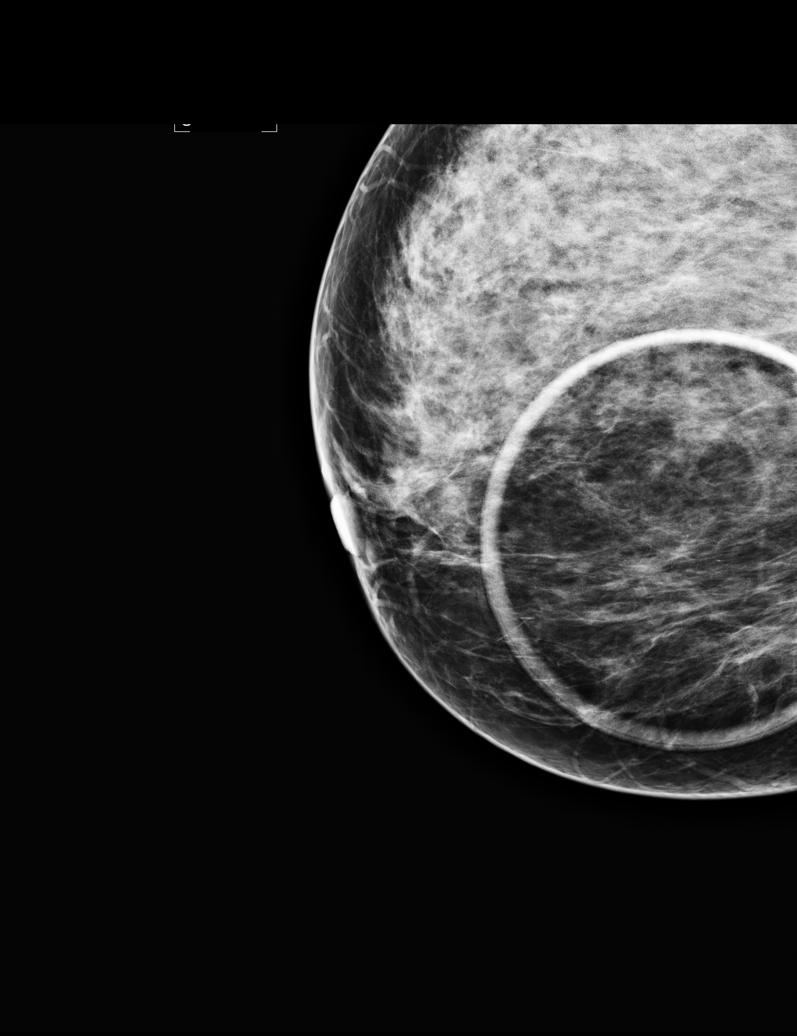

[R MLO (3 of 3)]
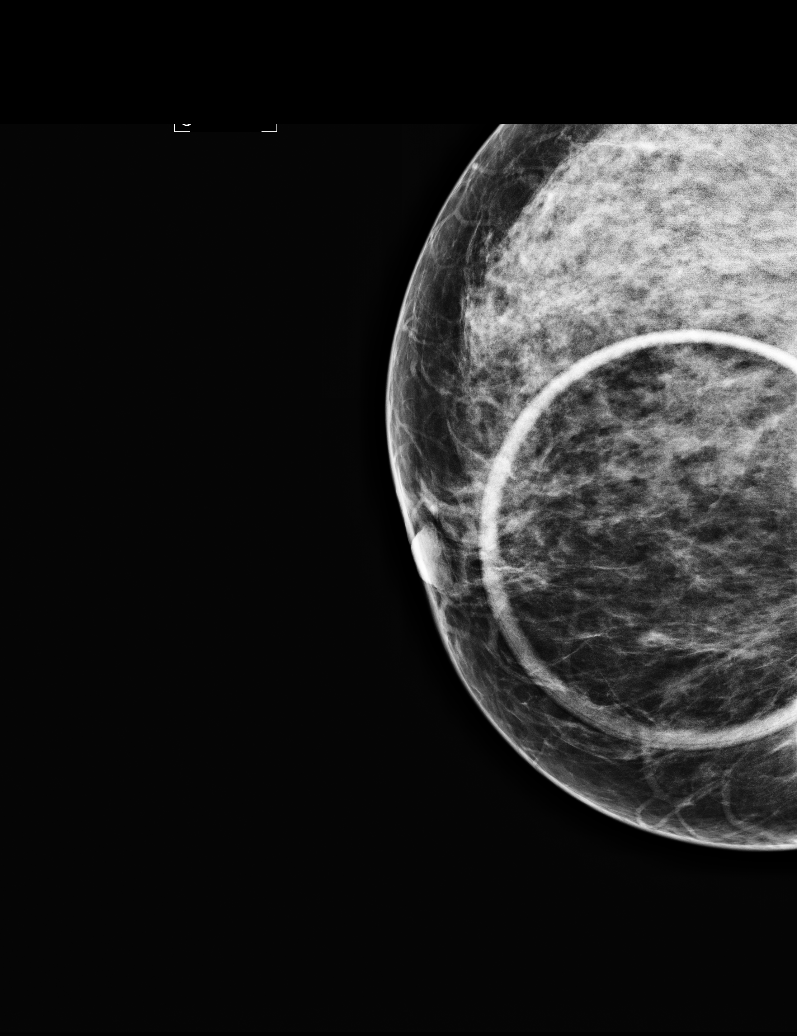

[5 of 5 positions shown; findings below may reference images not displayed]

FINDINGS: The breast tissue is heterogeneously dense.  There is no
suspicious dominant mass, architectural distortion or calcification
to suggest malignancy.
Mammographic images were processed with CAD.
IMPRESSION: No mammographic evidence of malignancy.

RECOMMENDATION:
Yearly screening mammography is suggested with next scheduled exam
in July 2012.

I have discussed the findings and recommendations with the patient.
Results were also provided in writing at the conclusion of the
visit.

BI-RADS CATEGORY 1:  Negative.

## 2014-03-20 ENCOUNTER — Other Ambulatory Visit: Payer: Self-pay | Admitting: Family Medicine

## 2014-07-10 ENCOUNTER — Other Ambulatory Visit (HOSPITAL_COMMUNITY): Payer: Self-pay | Admitting: General Surgery

## 2014-07-10 DIAGNOSIS — Z1231 Encounter for screening mammogram for malignant neoplasm of breast: Secondary | ICD-10-CM

## 2014-07-19 ENCOUNTER — Other Ambulatory Visit: Payer: Self-pay

## 2014-07-19 MED ORDER — FENOFIBRIC ACID 105 MG PO TABS: 105.0000 mg | ORAL_TABLET | Freq: Every day | ORAL | Status: DC

## 2014-07-19 MED ORDER — FENOFIBRATE 160 MG PO TABS: 160.0000 mg | ORAL_TABLET | Freq: Every day | ORAL | Status: DC

## 2014-07-20 ENCOUNTER — Other Ambulatory Visit: Payer: Self-pay

## 2014-07-20 DIAGNOSIS — R7303 Prediabetes: Secondary | ICD-10-CM

## 2014-07-20 DIAGNOSIS — I10 Essential (primary) hypertension: Secondary | ICD-10-CM

## 2014-07-20 DIAGNOSIS — E785 Hyperlipidemia, unspecified: Secondary | ICD-10-CM

## 2014-07-20 MED ORDER — FENOFIBRATE 160 MG PO TABS: 160.0000 mg | ORAL_TABLET | Freq: Every day | ORAL | Status: DC

## 2014-07-27 ENCOUNTER — Other Ambulatory Visit: Payer: Self-pay

## 2014-07-27 MED ORDER — FENOFIBRIC ACID 135 MG PO CPDR: 1.0000 | DELAYED_RELEASE_CAPSULE | Freq: Every day | ORAL | Status: DC

## 2014-08-01 ENCOUNTER — Other Ambulatory Visit: Payer: Self-pay | Admitting: Family Medicine

## 2014-08-01 LAB — COMPREHENSIVE METABOLIC PANEL
ALT: 21 U/L (ref 0–35)
AST: 21 U/L (ref 0–37)
Albumin: 4.6 g/dL (ref 3.5–5.2)
Alkaline Phosphatase: 53 U/L (ref 39–117)
BUN: 14 mg/dL (ref 6–23)
CHLORIDE: 103 meq/L (ref 96–112)
CO2: 26 meq/L (ref 19–32)
CREATININE: 0.78 mg/dL (ref 0.50–1.10)
Calcium: 10.1 mg/dL (ref 8.4–10.5)
Glucose, Bld: 73 mg/dL (ref 70–99)
Potassium: 3.6 mEq/L (ref 3.5–5.3)
Sodium: 138 mEq/L (ref 135–145)
Total Bilirubin: 0.6 mg/dL (ref 0.2–1.2)
Total Protein: 7.6 g/dL (ref 6.0–8.3)

## 2014-08-01 LAB — LIPID PANEL
Cholesterol: 199 mg/dL (ref 0–200)
HDL: 95 mg/dL (ref >=46)
LDL CALC: 97 mg/dL (ref 0–99)
Total CHOL/HDL Ratio: 2.1 Ratio
Triglycerides: 34 mg/dL (ref <150)
VLDL: 7 mg/dL (ref 0–40)

## 2014-08-02 LAB — HEMOGLOBIN A1C
HEMOGLOBIN A1C: 6 % — AB (ref <5.7)
MEAN PLASMA GLUCOSE: 126 mg/dL — AB (ref <117)

## 2014-08-07 ENCOUNTER — Ambulatory Visit (INDEPENDENT_AMBULATORY_CARE_PROVIDER_SITE_OTHER): Payer: 59 | Admitting: Family Medicine

## 2014-08-07 ENCOUNTER — Encounter: Payer: Self-pay | Admitting: Family Medicine

## 2014-08-07 VITALS — BP 126/80 | HR 78 | Resp 18 | Ht 64.0 in | Wt 162.1 lb

## 2014-08-07 DIAGNOSIS — E785 Hyperlipidemia, unspecified: Secondary | ICD-10-CM

## 2014-08-07 DIAGNOSIS — R1900 Intra-abdominal and pelvic swelling, mass and lump, unspecified site: Secondary | ICD-10-CM | POA: Diagnosis not present

## 2014-08-07 DIAGNOSIS — D509 Iron deficiency anemia, unspecified: Secondary | ICD-10-CM | POA: Diagnosis not present

## 2014-08-07 DIAGNOSIS — Z0000 Encounter for general adult medical examination without abnormal findings: Secondary | ICD-10-CM

## 2014-08-07 DIAGNOSIS — Z1211 Encounter for screening for malignant neoplasm of colon: Secondary | ICD-10-CM | POA: Diagnosis not present

## 2014-08-07 DIAGNOSIS — R7309 Other abnormal glucose: Secondary | ICD-10-CM

## 2014-08-07 DIAGNOSIS — I10 Essential (primary) hypertension: Secondary | ICD-10-CM

## 2014-08-07 DIAGNOSIS — R7303 Prediabetes: Secondary | ICD-10-CM

## 2014-08-07 HISTORY — DX: Encounter for general adult medical examination without abnormal findings: Z00.00

## 2014-08-07 LAB — POC HEMOCCULT BLD/STL (OFFICE/1-CARD/DIAGNOSTIC): FECAL OCCULT BLD: NEGATIVE

## 2014-08-07 MED ORDER — AMLODIPINE BESYLATE 2.5 MG PO TABS: ORAL_TABLET | ORAL | Status: DC

## 2014-08-07 MED ORDER — FENOFIBRIC ACID 135 MG PO CPDR: 1.0000 | DELAYED_RELEASE_CAPSULE | Freq: Every day | ORAL | Status: DC

## 2014-08-07 NOTE — Progress Notes (Signed)
   Subjective:    Patient ID: Cindy Weiss, female    DOB: [birth year], 54 y.o.   MRN: [id number]  HPI Patient is in for annual physical exam. No other health concerns are expressed or addressed at the visit. Recent labs,  are reviewed, and are excellent Immunization is reviewed ,no update needed    Review of Systems See HPI     Objective:   Physical Exam BP 126/80 mmHg  Pulse 78  Resp 18  Ht 5\' 4"  (1.626 m)  Wt 162 lb 1.9 oz (73.537 kg)  BMI 27.81 kg/m2  SpO2 100% Pleasant well nourished female, alert and oriented x 3, in no cardio-pulmonary distress. Afebrile. HEENT No facial trauma or asymetry. Sinuses non tender.  Extra occullar muscles intact, pupils equally reactive to light. External ears normal, tympanic membranes clear. Oropharynx moist, no exudate, good dentition. Neck: supple, no adenopathy,JVD or thyromegaly.No bruits.  Chest: Clear to ascultation bilaterally.No crackles or wheezes. Non tender to palpation  Breast: No asymetry,no masses or lumps. No tenderness. No nipple discharge or inversion. No axillary or supraclavicular adenopathy  Cardiovascular system; Heart sounds normal,  S1 and  S2 ,no S3.  No murmur, or thrill. Apical beat not displaced Peripheral pulses normal.  Abdomen: Soft, non tender, no organomegaly or masses. No bruits. Bowel sounds normal. No guarding, tenderness or rebound.  Rectal:  Normal sphincter tone. No mass.No rectal masses.  Guaiac negative stool.  GU: External genitalia normal female genitalia , female distribution of hair. No lesions. Urethral meatus normal in size, no  Prolapse, no lesions visibly  Present. Bladder non tender. Vagina pink and moist , with no visible lesions ,physiologic discharge present . Adequate pelvic support no  cystocele or rectocele noted Cervix pink and appears healthy, no lesions or ulcerations noted, no discharge noted from os Uterus enl;arged and hard, no adnexal masses, no cervical  motion or adnexal tenderness. Pelvic exam limited by seemingly calcified enlarged uterus, needs imaging done   Musculoskeletal exam: Full ROM of spine, hips , shoulders and knees. No deformity ,swelling or crepitus noted. No muscle wasting or atrophy.   Neurologic: Cranial nerves 2 to 12 intact. Power, tone ,sensation and reflexes normal throughout. No disturbance in gait. No tremor.  Skin: Intact, no ulceration, erythema , scaling or rash noted. Pigmentation normal throughout  Psych; Normal mood and affect. Judgement and concentration normal        Assessment & Plan:  Annual physical exam Annual exam as documented. Counseling done  re healthy lifestyle involving commitment to 150 minutes exercise per week, heart healthy diet, and attaining healthy weight.The importance of adequate sleep also discussed. Regular seat belt use and home safety, is also discussed. Changes in health habits are decided on by the patient with goals and time frames  set for achieving them. Immunization and cancer screening needs are specifically addressed at this visit.

## 2014-08-07 NOTE — Patient Instructions (Addendum)
F/u in 6 month, please call if you need me before  You are referred for a pelvic US to look at fibroids  Mammogram due this month  Keep up healthy lifestyle  Thanks for choosing [facility], we consider it a privelige to serve you.   Fasting labs in 6 month

## 2014-08-07 NOTE — Assessment & Plan Note (Signed)

## 2014-08-13 ENCOUNTER — Other Ambulatory Visit (HOSPITAL_COMMUNITY): Payer: 59

## 2014-08-14 ENCOUNTER — Ambulatory Visit (HOSPITAL_COMMUNITY): Payer: 59

## 2014-08-14 ENCOUNTER — Ambulatory Visit (HOSPITAL_COMMUNITY)
Admission: RE | Admit: 2014-08-14 | Discharge: 2014-08-14 | Disposition: A | Discharge status: Home / Self Care | Payer: 59 | Source: Ambulatory Visit | Attending: Family Medicine | Admitting: Family Medicine

## 2014-08-14 DIAGNOSIS — D259 Leiomyoma of uterus, unspecified: Secondary | ICD-10-CM | POA: Insufficient documentation

## 2014-08-14 DIAGNOSIS — R1900 Intra-abdominal and pelvic swelling, mass and lump, unspecified site: Secondary | ICD-10-CM

## 2014-08-20 ENCOUNTER — Ambulatory Visit (HOSPITAL_COMMUNITY)
Admission: RE | Admit: 2014-08-20 | Discharge: 2014-08-20 | Disposition: A | Discharge status: Home / Self Care | Payer: 59 | Source: Ambulatory Visit | Attending: General Surgery | Admitting: General Surgery

## 2014-08-20 DIAGNOSIS — Z1231 Encounter for screening mammogram for malignant neoplasm of breast: Secondary | ICD-10-CM | POA: Diagnosis not present

## 2014-08-28 ENCOUNTER — Telehealth: Payer: Self-pay | Admitting: Family Medicine

## 2014-08-28 ENCOUNTER — Other Ambulatory Visit: Payer: Self-pay

## 2014-08-28 MED ORDER — FENOFIBRIC ACID 135 MG PO CPDR: 1.0000 | DELAYED_RELEASE_CAPSULE | Freq: Every day | ORAL | Status: DC

## 2014-08-28 NOTE — Telephone Encounter (Signed)
Med refilled and patient aware. 

## 2014-09-04 ENCOUNTER — Other Ambulatory Visit: Payer: Self-pay

## 2014-10-02 ENCOUNTER — Other Ambulatory Visit: Payer: Self-pay

## 2014-10-02 MED ORDER — CHOLINE FENOFIBRATE 135 MG PO CPDR: 135.0000 mg | DELAYED_RELEASE_CAPSULE | Freq: Every day | ORAL | Status: DC

## 2015-01-26 ENCOUNTER — Other Ambulatory Visit: Payer: Self-pay | Admitting: Family Medicine

## 2015-01-26 LAB — COMPLETE METABOLIC PANEL WITH GFR
ALK PHOS: 61 U/L (ref 33–130)
ALT: 18 U/L (ref 6–29)
AST: 20 U/L (ref 10–35)
Albumin: 4.5 g/dL (ref 3.6–5.1)
BUN: 13 mg/dL (ref 7–25)
CO2: 27 mmol/L (ref 20–31)
Calcium: 10 mg/dL (ref 8.6–10.4)
Chloride: 106 mmol/L (ref 98–110)
Creat: 0.88 mg/dL (ref 0.50–1.05)
GFR, EST AFRICAN AMERICAN: 86 mL/min (ref >=60)
GFR, EST NON AFRICAN AMERICAN: 74 mL/min (ref >=60)
Glucose, Bld: 102 mg/dL — ABNORMAL HIGH (ref 65–99)
Potassium: 4.5 mmol/L (ref 3.5–5.3)
Sodium: 140 mmol/L (ref 135–146)
Total Bilirubin: 0.4 mg/dL (ref 0.2–1.2)
Total Protein: 7.4 g/dL (ref 6.1–8.1)

## 2015-01-26 LAB — CBC
HCT: 36.9 % (ref 36.0–46.0)
Hemoglobin: 12 g/dL (ref 12.0–15.0)
MCH: 21.9 pg — AB (ref 26.0–34.0)
MCHC: 32.5 g/dL (ref 30.0–36.0)
MCV: 67.2 fL — ABNORMAL LOW (ref 78.0–100.0)
MPV: 9.3 fL (ref 8.6–12.4)
PLATELETS: 353 10*3/uL (ref 150–400)
RBC: 5.49 MIL/uL — ABNORMAL HIGH (ref 3.87–5.11)
RDW: 15 % (ref 11.5–15.5)
WBC: 4.5 10*3/uL (ref 4.0–10.5)

## 2015-01-26 LAB — LIPID PANEL
CHOL/HDL RATIO: 2.1 ratio (ref <=5.0)
Cholesterol: 199 mg/dL (ref 125–200)
HDL: 97 mg/dL (ref >=46)
LDL Cholesterol: 94 mg/dL (ref <130)
TRIGLYCERIDES: 38 mg/dL (ref <150)
VLDL: 8 mg/dL (ref <30)

## 2015-01-26 LAB — TSH: TSH: 0.957 u[IU]/mL (ref 0.350–4.500)

## 2015-01-27 LAB — HEMOGLOBIN A1C
Hgb A1c MFr Bld: 6 % — ABNORMAL HIGH (ref <5.7)
Mean Plasma Glucose: 126 mg/dL — ABNORMAL HIGH (ref <117)

## 2015-01-28 LAB — VITAMIN D 25 HYDROXY (VIT D DEFICIENCY, FRACTURES): Vit D, 25-Hydroxy: 24 ng/mL — ABNORMAL LOW (ref 30–100)

## 2015-01-29 ENCOUNTER — Ambulatory Visit (INDEPENDENT_AMBULATORY_CARE_PROVIDER_SITE_OTHER): Payer: 59 | Admitting: Family Medicine

## 2015-01-29 ENCOUNTER — Encounter: Payer: Self-pay | Admitting: Family Medicine

## 2015-01-29 VITALS — BP 140/82 | HR 80 | Resp 18 | Ht 64.0 in | Wt 162.0 lb

## 2015-01-29 DIAGNOSIS — I10 Essential (primary) hypertension: Secondary | ICD-10-CM

## 2015-01-29 DIAGNOSIS — R7303 Prediabetes: Secondary | ICD-10-CM

## 2015-01-29 DIAGNOSIS — E663 Overweight: Secondary | ICD-10-CM

## 2015-01-29 DIAGNOSIS — E785 Hyperlipidemia, unspecified: Secondary | ICD-10-CM

## 2015-01-29 DIAGNOSIS — Z23 Encounter for immunization: Secondary | ICD-10-CM | POA: Diagnosis not present

## 2015-01-29 NOTE — Progress Notes (Signed)
Subjective:    Patient ID: Cindy Weiss, female    DOB: 1960/11/14, 54 y.o.   MRN: [id number]  HPI   AVRIL BUSSER     MRN: [id number]      DOB: [birth year]   HPI [name] is here for follow up and re-evaluation of chronic medical conditions, medication management and review of any available recent lab and radiology data.  Preventive health is updated, specifically  Cancer screening and Immunization.   Questions or concerns regarding consultations or procedures which the PT has had in the interim are  addressed. The PT denies any adverse reactions to current medications since the last visit.  There are no new concerns.  There are no specific complaints   ROS Denies recent fever or chills. Denies sinus pressure, nasal congestion, ear pain or sore throat. Denies chest congestion, productive cough or wheezing. Denies chest pains, palpitations and leg swelling Denies abdominal pain, nausea, vomiting,diarrhea or constipation.   Denies dysuria, frequency, hesitancy or incontinence. Denies joint pain, swelling and limitation in mobility. Denies headaches, seizures, numbness, or tingling. Denies depression, anxiety or insomnia. Denies skin break down or rash.   PE  BP 140/82 mmHg  Pulse 80  Resp 18  Ht 5\' 4"  (1.626 m)  Wt 162 lb (73.483 kg)  BMI 27.79 kg/m2  SpO2 98%  Patient alert and oriented and in no cardiopulmonary distress.  HEENT: No facial asymmetry, EOMI,   oropharynx pink and moist.  Neck supple no JVD, no mass.  Chest: Clear to auscultation bilaterally.  CVS: S1, S2 no murmurs, no S3.Regular rate.  ABD: Soft non tender.   Ext: No edema  MS: Adequate ROM spine, shoulders, hips and knees.  Skin: Intact, no ulcerations or rash noted.  Psych: Good eye contact, normal affect. Memory intact not anxious or depressed appearing.  CNS: CN 2-12 intact, power,  normal throughout.no focal deficits noted.   Assessment & Plan  HTN (hypertension) Elevated t  visit, needs return for bP check with nurse in 5 week. No med change today DASH diet and commitment to daily physical activity for a minimum of 30 minutes discussed and encouraged, as a part of hypertension management. The importance of attaining a healthy weight is also discussed.  BP/Weight 01/29/2015 08/07/2014 01/16/2014 08/15/2013 03/07/2013 08/31/2012 4/66/5993  Systolic BP 570 177 939 030 092 330 076  Diastolic BP 82 80 82 82 82 78 76  Wt. (Lbs) 162 162.12 163 158 157 157 159.12  BMI 27.79 27.81 27.97 27.11 26.94 26.94 27.3        Hyperlipidemia Controlled, no change in medication Hyperlipidemia:Low fat diet discussed and encouraged.   Lipid Panel  Lab Results  Component Value Date   CHOL 199 01/26/2015   HDL 97 01/26/2015   LDLCALC 94 01/26/2015   TRIG 38 01/26/2015   CHOLHDL 2.1 01/26/2015        Prediabetes Patient educated about the importance of limiting  Carbohydrate intake , the need to commit to daily physical activity for a minimum of 30 minutes , and to commit weight loss. The fact that changes in all these areas will reduce or eliminate all together the development of diabetes is stressed.  Unchanged Updated lab needed at/ before next visit.    Diabetic Labs Latest Ref Rng 01/26/2015 08/01/2014 01/13/2014 08/10/2013 03/01/2013  HbA1c <5.7 % 6.0(H) 6.0(H) 6.1(H) 6.3(H) 6.1(H)  Chol 125 - 200 mg/dL 199 199 199 195 217(H)  HDL >=46 mg/dL 97 95 89 94 92  Calc LDL <130 mg/dL 94 97 102(H) 95 118(H)  Triglycerides <150 mg/dL 38 34 42 32 37  Creatinine 0.50 - 1.05 mg/dL 0.88 0.78 0.83 0.90 0.76   BP/Weight 01/29/2015 08/07/2014 01/16/2014 08/15/2013 03/07/2013 08/31/2012 5/59/7416  Systolic BP 384 536 468 032 122 482 500  Diastolic BP 82 80 82 82 82 78 76  Wt. (Lbs) 162 162.12 163 158 157 157 159.12  BMI 27.79 27.81 27.97 27.11 26.94 26.94 27.3   No flowsheet data found.     Overweight Unchanged. Patient re-educated about  the importance of commitment to a   minimum of 150 minutes of exercise per week.  The importance of healthy food choices with portion control discussed. Encouraged to start a food diary, count calories and to consider  joining a support group. Sample diet sheets offered. Goals set by the patient for the next several months.   Weight /BMI 01/29/2015 08/07/2014 01/16/2014  WEIGHT 162 lb 162 lb 1.9 oz 163 lb  HEIGHT 5\' 4"  5\' 4"  5\' 4"   BMI 27.79 kg/m2 27.81 kg/m2 27.97 kg/m2    Current exercise per week 90 minutes.      Review of Systems     Objective:   Physical Exam        Assessment & Plan:

## 2015-01-29 NOTE — Patient Instructions (Addendum)
CPE May 16 or after, call if you need me sooner  Nurse BP check mid December  PLS stop extra salt, no salt, commit to daily exercise for 30 minutes and increase fresh/frozen fruit and vegetable  Labs are excellent  Flu vaccine today Fasting lipid, cmp and EGFr, HBA1C mid March  Thanks for choosing [facility], we consider it a privelige to serve you.   Please work on good  health habits so that your health will improve. 1. Commitment to daily physical activity for 30 to 60  minutes, if you are able to do this.  2. Commitment to wise food choices. Aim for half of your  food intake to be vegetable and fruit, one quarter starchy foods, and one quarter protein. Try to eat on a regular schedule  3 meals per day, snacking between meals should be limited to vegetables or fruits or small portions of nuts. 64 ounces of water per day is generally recommended, unless you have specific health conditions, like heart failure or kidney failure where you will need to limit fluid intake.  3. Commitment to sufficient and a  good quality of physical and mental rest daily, generally between 6 to 8 hours per day.  WITH PERSISTANCE AND PERSEVERANCE, THE IMPOSSIBLE , BECOMES THE NORM!

## 2015-01-30 LAB — HEPATITIS C ANTIBODY: HCV AB: NEGATIVE

## 2015-01-30 LAB — HIV ANTIBODY (ROUTINE TESTING W REFLEX): HIV: NONREACTIVE

## 2015-02-10 NOTE — Assessment & Plan Note (Signed)
Controlled, no change in medication Hyperlipidemia:Low fat diet discussed and encouraged.   Lipid Panel  Lab Results  Component Value Date   CHOL 199 01/26/2015   HDL 97 01/26/2015   LDLCALC 94 01/26/2015   TRIG 38 01/26/2015   CHOLHDL 2.1 01/26/2015

## 2015-02-10 NOTE — Assessment & Plan Note (Signed)
Patient educated about the importance of limiting  Carbohydrate intake , the need to commit to daily physical activity for a minimum of 30 minutes , and to commit weight loss. The fact that changes in all these areas will reduce or eliminate all together the development of diabetes is stressed.  Unchanged Updated lab needed at/ before next visit.    Diabetic Labs Latest Ref Rng 01/26/2015 08/01/2014 01/13/2014 08/10/2013 03/01/2013  HbA1c <5.7 % 6.0(H) 6.0(H) 6.1(H) 6.3(H) 6.1(H)  Chol 125 - 200 mg/dL 199 199 199 195 217(H)  HDL >=46 mg/dL 97 95 89 94 92  Calc LDL <130 mg/dL 94 97 102(H) 95 118(H)  Triglycerides <150 mg/dL 38 34 42 32 37  Creatinine 0.50 - 1.05 mg/dL 0.88 0.78 0.83 0.90 0.76   BP/Weight 01/29/2015 08/07/2014 01/16/2014 08/15/2013 03/07/2013 08/31/2012 Q000111Q  Systolic BP XX123456 123XX123 123XX123 Q000111Q 123456 0000000 0000000  Diastolic BP 82 80 82 82 82 78 76  Wt. (Lbs) 162 162.12 163 158 157 157 159.12  BMI 27.79 27.81 27.97 27.11 26.94 26.94 27.3   No flowsheet data found.

## 2015-02-10 NOTE — Assessment & Plan Note (Signed)
Unchanged. Patient re-educated about  the importance of commitment to a  minimum of 150 minutes of exercise per week.  The importance of healthy food choices with portion control discussed. Encouraged to start a food diary, count calories and to consider  joining a support group. Sample diet sheets offered. Goals set by the patient for the next several months.   Weight /BMI 01/29/2015 08/07/2014 01/16/2014  WEIGHT 162 lb 162 lb 1.9 oz 163 lb  HEIGHT 5\' 4"  5\' 4"  5\' 4"   BMI 27.79 kg/m2 27.81 kg/m2 27.97 kg/m2    Current exercise per week 90 minutes.

## 2015-02-10 NOTE — Assessment & Plan Note (Signed)
Elevated t visit, needs return for bP check with nurse in 5 week. No med change today DASH diet and commitment to daily physical activity for a minimum of 30 minutes discussed and encouraged, as a part of hypertension management. The importance of attaining a healthy weight is also discussed.  BP/Weight 01/29/2015 08/07/2014 01/16/2014 08/15/2013 03/07/2013 08/31/2012 Q000111Q  Systolic BP XX123456 [id number] [id number] Q000111Q 123456 0000000 0000000  Diastolic BP 82 80 82 82 82 78 76  Wt. (Lbs) 162 162.12 163 158 157 157 159.12  BMI 27.79 27.81 27.97 27.11 26.94 26.94 27.3

## 2015-03-20 ENCOUNTER — Ambulatory Visit (INDEPENDENT_AMBULATORY_CARE_PROVIDER_SITE_OTHER): Payer: 59

## 2015-03-20 VITALS — BP 128/90

## 2015-03-20 DIAGNOSIS — I10 Essential (primary) hypertension: Secondary | ICD-10-CM

## 2015-03-20 NOTE — Progress Notes (Signed)
Per Dr Moshe Cipro, no med changes and patient needs to follow up at her next office visit as scheduled. DASH diet given

## 2015-04-08 ENCOUNTER — Other Ambulatory Visit: Payer: Self-pay

## 2015-04-08 ENCOUNTER — Telehealth: Payer: Self-pay | Admitting: Family Medicine

## 2015-04-08 MED ORDER — CHOLINE FENOFIBRATE 135 MG PO CPDR: 135.0000 mg | DELAYED_RELEASE_CAPSULE | Freq: Every day | ORAL | Status: DC

## 2015-04-08 NOTE — Telephone Encounter (Signed)
Med refilled.

## 2015-04-08 NOTE — Telephone Encounter (Signed)
Patient is calling stating that she is out of refills on  Choline Fenofibrate (TRILIPIX) 135 MG capsule [id number] please advise?     Order

## 2015-06-07 ENCOUNTER — Other Ambulatory Visit: Payer: Self-pay | Admitting: Family Medicine

## 2015-07-23 ENCOUNTER — Other Ambulatory Visit: Payer: Self-pay | Admitting: Family Medicine

## 2015-07-23 DIAGNOSIS — Z1231 Encounter for screening mammogram for malignant neoplasm of breast: Secondary | ICD-10-CM

## 2015-08-10 LAB — HEMOGLOBIN A1C
Hgb A1c MFr Bld: 5.8 % — ABNORMAL HIGH (ref <5.7)
Mean Plasma Glucose: 120 mg/dL

## 2015-08-10 LAB — COMPLETE METABOLIC PANEL WITH GFR
ALT: 22 U/L (ref 6–29)
AST: 22 U/L (ref 10–35)
Albumin: 4.5 g/dL (ref 3.6–5.1)
Alkaline Phosphatase: 54 U/L (ref 33–130)
BUN: 18 mg/dL (ref 7–25)
CHLORIDE: 106 mmol/L (ref 98–110)
CO2: 27 mmol/L (ref 20–31)
CREATININE: 0.9 mg/dL (ref 0.50–1.05)
Calcium: 10 mg/dL (ref 8.6–10.4)
GFR, Est African American: 83 mL/min (ref >=60)
GFR, Est Non African American: 72 mL/min (ref >=60)
Glucose, Bld: 97 mg/dL (ref 65–99)
Potassium: 4.5 mmol/L (ref 3.5–5.3)
Sodium: 140 mmol/L (ref 135–146)
Total Bilirubin: 0.4 mg/dL (ref 0.2–1.2)
Total Protein: 7.4 g/dL (ref 6.1–8.1)

## 2015-08-10 LAB — LIPID PANEL
CHOL/HDL RATIO: 2.1 ratio (ref <=5.0)
Cholesterol: 189 mg/dL (ref 125–200)
HDL: 89 mg/dL (ref >=46)
LDL Cholesterol: 91 mg/dL (ref <130)
TRIGLYCERIDES: 44 mg/dL (ref <150)
VLDL: 9 mg/dL (ref <30)

## 2015-08-15 ENCOUNTER — Other Ambulatory Visit (HOSPITAL_COMMUNITY)
Admission: RE | Admit: 2015-08-15 | Discharge: 2015-08-15 | Disposition: A | Discharge status: Home / Self Care | Payer: 59 | Source: Ambulatory Visit | Attending: Family Medicine | Admitting: Family Medicine

## 2015-08-15 ENCOUNTER — Encounter: Payer: Self-pay | Admitting: Family Medicine

## 2015-08-15 ENCOUNTER — Ambulatory Visit (INDEPENDENT_AMBULATORY_CARE_PROVIDER_SITE_OTHER): Payer: 59 | Admitting: Family Medicine

## 2015-08-15 VITALS — BP 128/76 | HR 80 | Resp 18 | Ht 64.0 in | Wt 159.0 lb

## 2015-08-15 DIAGNOSIS — E785 Hyperlipidemia, unspecified: Secondary | ICD-10-CM | POA: Diagnosis not present

## 2015-08-15 DIAGNOSIS — Z1211 Encounter for screening for malignant neoplasm of colon: Secondary | ICD-10-CM

## 2015-08-15 DIAGNOSIS — Z124 Encounter for screening for malignant neoplasm of cervix: Secondary | ICD-10-CM

## 2015-08-15 DIAGNOSIS — Z1151 Encounter for screening for human papillomavirus (HPV): Secondary | ICD-10-CM | POA: Diagnosis not present

## 2015-08-15 DIAGNOSIS — E559 Vitamin D deficiency, unspecified: Secondary | ICD-10-CM

## 2015-08-15 DIAGNOSIS — R7303 Prediabetes: Secondary | ICD-10-CM

## 2015-08-15 DIAGNOSIS — Z01419 Encounter for gynecological examination (general) (routine) without abnormal findings: Secondary | ICD-10-CM | POA: Diagnosis present

## 2015-08-15 DIAGNOSIS — Z0000 Encounter for general adult medical examination without abnormal findings: Secondary | ICD-10-CM

## 2015-08-15 DIAGNOSIS — I10 Essential (primary) hypertension: Secondary | ICD-10-CM

## 2015-08-15 LAB — POC HEMOCCULT BLD/STL (OFFICE/1-CARD/DIAGNOSTIC): FECAL OCCULT BLD: NEGATIVE

## 2015-08-15 NOTE — Progress Notes (Signed)
   Subjective:    Patient ID: Cindy Weiss, female    DOB: 12/27/1960, 55 y.o.   MRN: [id number]  HPI Patient is in for annual physical exam. No other health concerns are expressed or addressed at the visit. Recent labs, if available are reviewed. Immunization is reviewed , and  updated if needed.    Review of Systems See HPI     Objective:   Physical Exam BP 128/76 mmHg  Pulse 80  Resp 18  Ht 5\' 4"  (1.626 m)  Wt 159 lb (72.122 kg)  BMI 27.28 kg/m2  SpO2 99%  Pleasant well nourished female, alert and oriented x 3, in no cardio-pulmonary distress. Afebrile. HEENT No facial trauma or asymetry. Sinuses non tender.  Extra occullar muscles intact, pupils equally reactive to light. External ears normal, tympanic membranes clear. Oropharynx moist, no exudate, good dentition. Neck: supple, no adenopathy,JVD or thyromegaly.No bruits.  Chest: Clear to ascultation bilaterally.No crackles or wheezes. Non tender to palpation  Breast: No asymetry,no masses or lumps. No tenderness. No nipple discharge or inversion. No axillary or supraclavicular adenopathy  Cardiovascular system; Heart sounds normal,  S1 and  S2 ,no S3.  No murmur, or thrill. Apical beat not displaced Peripheral pulses normal.  Abdomen: Soft, non tender, no organomegaly , pelvic mass , approx 14 weeks No bruits. Bowel sounds normal. No guarding, tenderness or rebound.  Rectal:  Normal sphincter tone. No mass.No rectal masses.  Guaiac negative stool.  GU: External genitalia normal female genitalia , female distribution of hair. No lesions. Urethral meatus normal in size, no  Prolapse, no lesions visibly  Present. Bladder non tender. Vagina pink and moist , with no visible lesions , discharge present . Adequate pelvic support no  cystocele or rectocele noted Cervix pink and appears healthy, no lesions or ulcerations noted, no discharge noted from os Uterus enlarged, no adnexal masses, no cervical  motion or adnexal tenderness.   Musculoskeletal exam: Full ROM of spine, hips , shoulders and knees. No deformity ,swelling or crepitus noted. No muscle wasting or atrophy.   Neurologic: Cranial nerves 2 to 12 intact. Power, tone ,sensation and reflexes normal throughout. No disturbance in gait. No tremor.  Skin: Intact, no ulceration, erythema , scaling or rash noted. Pigmentation normal throughout  Psych; Normal mood and affect. Judgement and concentration normal        Assessment & Plan:  Annual physical exam Annual exam as documented. Counseling done  re healthy lifestyle involving commitment to 150 minutes exercise per week, heart healthy diet, and attaining healthy weight.The importance of adequate sleep also discussed. Regular seat belt use and home safety, is also discussed. Changes in health habits are decided on by the patient with goals and time frames  set for achieving them. Immunization and cancer screening needs are specifically addressed at this visit.

## 2015-08-15 NOTE — Patient Instructions (Addendum)
F/U in November early, call igf you need me before  Excellent blood pressure and labs   Keep up the great work  Fasting lipid, cmp and EGFr , HBA1C, TSH and vit D Oct 30 or after  Screen is good  Thanks for choosing [facility], we consider it a privelige to serve you.  

## 2015-08-17 NOTE — Assessment & Plan Note (Signed)

## 2015-08-19 ENCOUNTER — Other Ambulatory Visit: Payer: Self-pay

## 2015-08-19 LAB — CYTOLOGY - PAP

## 2015-08-19 MED ORDER — CHOLINE FENOFIBRATE 135 MG PO CPDR: 135.0000 mg | DELAYED_RELEASE_CAPSULE | Freq: Every day | ORAL | Status: DC

## 2015-08-20 ENCOUNTER — Telehealth: Payer: Self-pay

## 2015-08-20 NOTE — Telephone Encounter (Signed)
Insurance will not pay for trilipix 135. Can't take the 145 mg die to muscle cramps. Insurance prefers fenofibrate 54mg  or 160mg . Is scared of the higher dose. Wants to know if you want to change her to the 54mg  or not take anything at all? Please advise

## 2015-08-21 MED ORDER — FENOFIBRATE 54 MG PO TABS
54.0000 mg | ORAL_TABLET | Freq: Every day | ORAL | Status: DC
Start: 2015-08-21 — End: 2016-03-09

## 2015-08-21 NOTE — Addendum Note (Signed)
Addended by: Eual Fines on: 08/21/2015 01:11 PM   Modules accepted: Orders, Medications

## 2015-08-21 NOTE — Telephone Encounter (Signed)
Left message on patients voicemail and med sent in

## 2015-08-21 NOTE — Telephone Encounter (Signed)
Pl send in lower 54mg  dose , let her knwo to call iof a perob, but I think she will do well with the med. Encourage her!

## 2015-09-02 ENCOUNTER — Ambulatory Visit (HOSPITAL_COMMUNITY)
Admission: RE | Admit: 2015-09-02 | Discharge: 2015-09-02 | Disposition: A | Discharge status: Home / Self Care | Payer: 59 | Source: Ambulatory Visit | Attending: Family Medicine | Admitting: Family Medicine

## 2015-09-02 DIAGNOSIS — Z1231 Encounter for screening mammogram for malignant neoplasm of breast: Secondary | ICD-10-CM | POA: Diagnosis present

## 2015-09-15 ENCOUNTER — Other Ambulatory Visit: Payer: Self-pay | Admitting: Family Medicine

## 2015-09-18 ENCOUNTER — Other Ambulatory Visit: Payer: Self-pay

## 2015-09-18 MED ORDER — AMLODIPINE BESYLATE 2.5 MG PO TABS: 2.5000 mg | ORAL_TABLET | Freq: Every day | ORAL | Status: DC

## 2015-12-30 ENCOUNTER — Telehealth: Payer: Self-pay

## 2015-12-30 DIAGNOSIS — R7303 Prediabetes: Secondary | ICD-10-CM

## 2015-12-30 DIAGNOSIS — I10 Essential (primary) hypertension: Secondary | ICD-10-CM

## 2015-12-30 DIAGNOSIS — E559 Vitamin D deficiency, unspecified: Secondary | ICD-10-CM

## 2015-12-30 DIAGNOSIS — E782 Mixed hyperlipidemia: Secondary | ICD-10-CM

## 2015-12-30 NOTE — Telephone Encounter (Signed)
Cancelled labs reordered to have done in November before next appointment

## 2016-02-01 LAB — COMPLETE METABOLIC PANEL WITH GFR
ALT: 16 U/L (ref 6–29)
AST: 18 U/L (ref 10–35)
Albumin: 4.7 g/dL (ref 3.6–5.1)
Alkaline Phosphatase: 69 U/L (ref 33–130)
BUN: 23 mg/dL (ref 7–25)
CALCIUM: 10.1 mg/dL (ref 8.6–10.4)
CHLORIDE: 104 mmol/L (ref 98–110)
CO2: 26 mmol/L (ref 20–31)
CREATININE: 0.87 mg/dL (ref 0.50–1.05)
GFR, Est African American: 86 mL/min (ref >=60)
GFR, Est Non African American: 75 mL/min (ref >=60)
Glucose, Bld: 89 mg/dL (ref 65–99)
Potassium: 4.5 mmol/L (ref 3.5–5.3)
Sodium: 139 mmol/L (ref 135–146)
Total Bilirubin: 0.3 mg/dL (ref 0.2–1.2)
Total Protein: 7.7 g/dL (ref 6.1–8.1)

## 2016-02-01 LAB — LIPID PANEL
Cholesterol: 234 mg/dL — ABNORMAL HIGH (ref 125–200)
HDL: 93 mg/dL (ref >=46)
LDL Cholesterol: 132 mg/dL — ABNORMAL HIGH (ref <130)
Total CHOL/HDL Ratio: 2.5 Ratio (ref <=5.0)
Triglycerides: 46 mg/dL (ref <150)
VLDL: 9 mg/dL (ref <30)

## 2016-02-01 LAB — TSH: TSH: 1.12 mIU/L

## 2016-02-02 LAB — HEMOGLOBIN A1C
HEMOGLOBIN A1C: 5.7 % — AB (ref <5.7)
MEAN PLASMA GLUCOSE: 117 mg/dL

## 2016-02-03 LAB — VITAMIN D 25 HYDROXY (VIT D DEFICIENCY, FRACTURES): Vit D, 25-Hydroxy: 27 ng/mL — ABNORMAL LOW (ref 30–100)

## 2016-02-05 ENCOUNTER — Telehealth: Payer: Self-pay | Admitting: Family Medicine

## 2016-02-05 NOTE — Telephone Encounter (Signed)
Entry error as far as result note speaking about clot in leg, inadvertently entered in incorrect chart.I will discuss her labs at next visit i

## 2016-02-11 ENCOUNTER — Ambulatory Visit (INDEPENDENT_AMBULATORY_CARE_PROVIDER_SITE_OTHER): Payer: 59 | Admitting: Family Medicine

## 2016-02-11 ENCOUNTER — Encounter: Payer: Self-pay | Admitting: Family Medicine

## 2016-02-11 VITALS — BP 118/74 | HR 64 | Resp 16 | Ht 64.0 in | Wt 164.0 lb

## 2016-02-11 DIAGNOSIS — Z23 Encounter for immunization: Secondary | ICD-10-CM | POA: Diagnosis not present

## 2016-02-11 DIAGNOSIS — E782 Mixed hyperlipidemia: Secondary | ICD-10-CM

## 2016-02-11 DIAGNOSIS — I10 Essential (primary) hypertension: Secondary | ICD-10-CM | POA: Diagnosis not present

## 2016-02-11 DIAGNOSIS — E663 Overweight: Secondary | ICD-10-CM

## 2016-02-11 DIAGNOSIS — R7303 Prediabetes: Secondary | ICD-10-CM

## 2016-02-11 NOTE — Progress Notes (Signed)
Cindy Weiss     MRN: [id number]      DOB: 06/02/1960   HPI Ms. Hi is here for follow up and re-evaluation of chronic medical conditions, medication management and review of any available recent lab and radiology data.  Preventive health is updated, specifically  Cancer screening and Immunization.   Questions or concerns regarding consultations or procedures which the PT has had in the interim are  addressed. The PT denies any adverse reactions to current medications since the last visit.  There are no new concerns.  There are no specific complaints   ROS Denies recent fever or chills. Denies sinus pressure, nasal congestion, ear pain or sore throat. Denies chest congestion, productive cough or wheezing. Denies chest pains, palpitations and leg swelling Denies abdominal pain, nausea, vomiting,diarrhea or constipation.   Denies dysuria, frequency, hesitancy or incontinence. Denies joint pain, swelling and limitation in mobility. Denies headaches, seizures, numbness, or tingling. Denies depression, anxiety or insomnia. Denies skin break down or rash.   PE  BP 118/74   Pulse 64   Resp 16   Ht 5\' 4"  (1.626 m)   Wt 164 lb (74.4 kg)   SpO2 96%   BMI 28.15 kg/m   Patient alert and oriented and in no cardiopulmonary distress.  HEENT: No facial asymmetry, EOMI,   oropharynx pink and moist.  Neck supple no JVD, no mass.  Chest: Clear to auscultation bilaterally.  CVS: S1, S2 no murmurs, no S3.Regular rate.  ABD: Soft non tender.   Ext: No edema  MS: Adequate ROM spine, shoulders, hips and knees.  Skin: Intact, no ulcerations or rash noted.  Psych: Good eye contact, normal affect. Memory intact not anxious or depressed appearing.  CNS: CN 2-12 intact, power,  normal throughout.no focal deficits noted.   Assessment & Plan  HTN (hypertension) Controlled, no change in medication DASH diet and commitment to daily physical activity for a minimum of 30 minutes  discussed and encouraged, as a part of hypertension management. The importance of attaining a healthy weight is also discussed.  BP/Weight 02/11/2016 08/15/2015 03/20/2015 01/29/2015 08/07/2014 01/16/2014 123456  Systolic BP 123456 0000000 0000000 [id number] 123XX123 [id number] Q000111Q  Diastolic BP 74 76 90 82 80 82 82  Wt. (Lbs) 164 159 - 162 162.12 163 158  BMI 28.15 27.28 - 27.79 27.81 27.97 27.11       Hyperlipidemia Deteriorated, not at goal Hyperlipidemia:Low fat diet discussed and encouraged.   Lipid Panel  Lab Results  Component Value Date   CHOL 234 (H) 02/01/2016   HDL 93 02/01/2016   LDLCALC 132 (H) 02/01/2016   TRIG 46 02/01/2016   CHOLHDL 2.5 02/01/2016   No med change, repeat in 4 months  \  Prediabetes Patient educated about the importance of limiting  Carbohydrate intake , the need to commit to daily physical activity for a minimum of 30 minutes , and to commit weight loss. The fact that changes in all these areas will reduce or eliminate all together the development of diabetes is stressed.  Improved  Diabetic Labs Latest Ref Rng & Units 02/01/2016 08/10/2015 01/26/2015 08/01/2014 01/13/2014  HbA1c <5.7 % 5.7(H) 5.8(H) 6.0(H) 6.0(H) 6.1(H)  Chol 125 - 200 mg/dL 234(H) 189 199 199 199  HDL >=46 mg/dL 93 89 97 95 89  Calc LDL <130 mg/dL 132(H) 91 94 97 102(H)  Triglycerides <150 mg/dL 46 44 38 34 42  Creatinine 0.50 - 1.05 mg/dL 0.87 0.90 0.88 0.78 0.83   BP/Weight 02/11/2016  08/15/2015 03/20/2015 01/29/2015 08/07/2014 01/16/2014 123456  Systolic BP 123456 0000000 0000000 [id number] [id number] [id number] Q000111Q  Diastolic BP 74 76 90 82 80 82 82  Wt. (Lbs) 164 159 - 162 162.12 163 158  BMI 28.15 27.28 - 27.79 27.81 27.97 27.11   No flowsheet data found.    Overweight Deteriorated. Patient re-educated about  the importance of commitment to a  minimum of 150 minutes of exercise per week.  The importance of healthy food choices with portion control discussed. Encouraged to start a food diary, count calories and to  consider  joining a support group. Sample diet sheets offered. Goals set by the patient for the next several months.   Weight /BMI 02/11/2016 08/15/2015 01/29/2015  WEIGHT 164 lb 159 lb 162 lb  HEIGHT 5\' 4"  5\' 4"  5\' 4"   BMI 28.15 kg/m2 27.28 kg/m2 27.79 kg/m2

## 2016-02-11 NOTE — Patient Instructions (Signed)
F/u in 4.5 months or after, call if you need me sooner  Please reduce fried and fatty foods, cholesterol is increased  Fasting lipid, cmp in 4.5 months  Flu vaccine today  5 pounds off by next visit  Thank you  for choosing [facility]. We consider it a privelige to serve you.  Delivering excellent health care in a caring and  compassionate way is our goal.  Partnering with you,  so that together we can achieve this goal is our strategy.

## 2016-02-12 NOTE — Assessment & Plan Note (Signed)
Deteriorated, not at goal Hyperlipidemia:Low fat diet discussed and encouraged.   Lipid Panel  Lab Results  Component Value Date   CHOL 234 (H) 02/01/2016   HDL 93 02/01/2016   LDLCALC 132 (H) 02/01/2016   TRIG 46 02/01/2016   CHOLHDL 2.5 02/01/2016   No med change, repeat in 4 months  \

## 2016-02-12 NOTE — Assessment & Plan Note (Signed)
Controlled, no change in medication DASH diet and commitment to daily physical activity for a minimum of 30 minutes discussed and encouraged, as a part of hypertension management. The importance of attaining a healthy weight is also discussed.  BP/Weight 02/11/2016 08/15/2015 03/20/2015 01/29/2015 08/07/2014 01/16/2014 123456  Systolic BP 123456 0000000 0000000 [id number] [id number] Q000111Q  Diastolic BP 74 76 90 82 80 82 82  Wt. (Lbs) 164 159 - 162 162.12 163 158  BMI 28.15 27.28 - 27.79 27.81 27.97 27.11

## 2016-02-12 NOTE — Assessment & Plan Note (Signed)
Deteriorated. Patient re-educated about  the importance of commitment to a  minimum of 150 minutes of exercise per week.  The importance of healthy food choices with portion control discussed. Encouraged to start a food diary, count calories and to consider  joining a support group. Sample diet sheets offered. Goals set by the patient for the next several months.   Weight /BMI 02/11/2016 08/15/2015 01/29/2015  WEIGHT 164 lb 159 lb 162 lb  HEIGHT 5\' 4"  5\' 4"  5\' 4"   BMI 28.15 kg/m2 27.28 kg/m2 27.79 kg/m2

## 2016-02-12 NOTE — Assessment & Plan Note (Signed)
Patient educated about the importance of limiting  Carbohydrate intake , the need to commit to daily physical activity for a minimum of 30 minutes , and to commit weight loss. The fact that changes in all these areas will reduce or eliminate all together the development of diabetes is stressed.  Improved  Diabetic Labs Latest Ref Rng & Units 02/01/2016 08/10/2015 01/26/2015 08/01/2014 01/13/2014  HbA1c <5.7 % 5.7(H) 5.8(H) 6.0(H) 6.0(H) 6.1(H)  Chol 125 - 200 mg/dL 234(H) 189 199 199 199  HDL >=46 mg/dL 93 89 97 95 89  Calc LDL <130 mg/dL 132(H) 91 94 97 102(H)  Triglycerides <150 mg/dL 46 44 38 34 42  Creatinine 0.50 - 1.05 mg/dL 0.87 0.90 0.88 0.78 0.83   BP/Weight 02/11/2016 08/15/2015 03/20/2015 01/29/2015 08/07/2014 01/16/2014 123456  Systolic BP 123456 0000000 0000000 [id number] [id number]  Diastolic BP 74 76 90 82 80 82 82  Wt. (Lbs) 164 159 - 162 162.12 163 158  BMI 28.15 27.28 - 27.79 27.81 27.97 27.11   No flowsheet data found.

## 2016-02-18 ENCOUNTER — Emergency Department (HOSPITAL_COMMUNITY): Payer: 59

## 2016-02-18 ENCOUNTER — Encounter (HOSPITAL_COMMUNITY): Payer: Self-pay | Admitting: Emergency Medicine

## 2016-02-18 ENCOUNTER — Emergency Department (HOSPITAL_COMMUNITY)
Admission: EM | Admit: 2016-02-18 | Discharge: 2016-02-18 | Disposition: A | Discharge status: Home / Self Care | Payer: 59 | Attending: Emergency Medicine | Admitting: Emergency Medicine

## 2016-02-18 DIAGNOSIS — Z79899 Other long term (current) drug therapy: Secondary | ICD-10-CM | POA: Diagnosis not present

## 2016-02-18 DIAGNOSIS — R079 Chest pain, unspecified: Secondary | ICD-10-CM | POA: Diagnosis not present

## 2016-02-18 DIAGNOSIS — I10 Essential (primary) hypertension: Secondary | ICD-10-CM | POA: Diagnosis not present

## 2016-02-18 LAB — BASIC METABOLIC PANEL
Anion gap: 8 (ref 5–15)
BUN: 16 mg/dL (ref 6–20)
CALCIUM: 10.2 mg/dL (ref 8.9–10.3)
CHLORIDE: 103 mmol/L (ref 101–111)
CO2: 28 mmol/L (ref 22–32)
CREATININE: 0.77 mg/dL (ref 0.44–1.00)
GFR calc non Af Amer: >60 mL/min (ref >60)
Glucose, Bld: 88 mg/dL (ref 65–99)
Potassium: 4 mmol/L (ref 3.5–5.1)
Sodium: 139 mmol/L (ref 135–145)

## 2016-02-18 LAB — CBC
HCT: 36.6 % (ref 36.0–46.0)
Hemoglobin: 12.3 g/dL (ref 12.0–15.0)
MCH: 22.8 pg — AB (ref 26.0–34.0)
MCHC: 33.6 g/dL (ref 30.0–36.0)
MCV: 67.8 fL — AB (ref 78.0–100.0)
PLATELETS: 281 10*3/uL (ref 150–400)
RBC: 5.4 MIL/uL — AB (ref 3.87–5.11)
RDW: 14.7 % (ref 11.5–15.5)
WBC: 9.7 10*3/uL (ref 4.0–10.5)

## 2016-02-18 LAB — TROPONIN I

## 2016-02-18 NOTE — ED Triage Notes (Signed)
Pt reports pain in center of chest without radiation, no other accompanying symptoms. Pain is intermittent.  Pt alert and oriented at this time.  Cp started Sunday.

## 2016-02-18 NOTE — Discharge Instructions (Signed)
Use Tylenol and a heating pad  for pain, if needed.

## 2016-02-18 NOTE — ED Provider Notes (Signed)
[place] DEPT Provider Note   CSN: [id number] Arrival date & time: 02/18/16  1619     History   Chief Complaint Chief Complaint  Patient presents with  . Chest Pain    HPI Cindy Weiss is a 55 y.o. female.  She presents for evaluation of intermittent chest pain which started several days ago. She has had episodes of pain lasting about a minute, several times without provocation. She denies nausea, vomiting, diaphoresis, cough, weakness or dizziness. No trauma. No change with body position. No recent change in medications. 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 There are no other known modifying factors.   HPI  Past Medical History:  Diagnosis Date  . Anemia   . Fibroid tumor   . Fibroids   . High cholesterol   . Hypertension     Patient Active Problem List   Diagnosis Date Noted  . Prediabetes 06/02/2012  . HTN (hypertension) 12/08/2010  . Overweight 09/21/2010  . FIBROIDS, UTERUS 03/04/2009  . Hyperlipidemia 02/02/2008    Past Surgical History:  Procedure Laterality Date  . BREAST LUMPECTOMY     left breast   . fibroid tumor removal    . mymectomy      OB History    No data available       Home Medications    Prior to Admission medications   Medication Sig Start Date End Date Taking? Authorizing Provider  amLODipine (NORVASC) 2.5 MG tablet Take 1 tablet (2.5 mg total) by mouth daily. 09/18/15  Yes Fayrene Helper, MD  fenofibrate 54 MG tablet Take 1 tablet (54 mg total) by mouth daily. 08/21/15  Yes Fayrene Helper, MD  Multiple Vitamins-Minerals (ALIVE WOMENS 50+ PO) Take 1 tablet by mouth daily.   Yes Historical Provider, MD    Family History Family History  Problem Relation Age of Onset  . Hyperlipidemia Mother   . Thyroid disease Father   . Hypertension Sister     Social History Social History  Substance Use Topics  . Smoking status: Never Smoker  . Smokeless tobacco: Not on file  . Alcohol  use No     Allergies   Pravastatin   Review of Systems Review of Systems  All other systems reviewed and are negative.    Physical Exam Updated Vital Signs BP 132/83   Pulse 72   Temp 97.6 F (36.4 C) (Oral)   Resp 12   Ht 5\' 4"  (1.626 m)   Wt 164 lb (74.4 kg)   SpO2 99%   BMI 28.15 kg/m   Physical Exam  Constitutional: She is oriented to person, place, and time. She appears well-developed and well-nourished. No distress.  HENT:  Head: Normocephalic and atraumatic.  Eyes: Conjunctivae and EOM are normal. Pupils are equal, round, and reactive to light.  Neck: Normal range of motion and phonation normal. Neck supple.  Cardiovascular: Normal rate and regular rhythm.   Pulmonary/Chest: Effort normal and breath sounds normal. She exhibits no tenderness.  Abdominal: Soft. She exhibits no distension. There is no tenderness. There is no guarding.  Musculoskeletal: Normal range of motion.  Neurological: She is alert and oriented to person, place, and time. She exhibits normal muscle tone.  Skin: Skin is warm and dry.  Psychiatric: She has a normal mood and affect. Her behavior is normal. Judgment and thought content normal.  Nursing note and vitals reviewed.    ED Treatments / Results  Labs (all labs ordered are  listed, but only abnormal results are displayed) Labs Reviewed  CBC - Abnormal; Notable for the following:       Result Value   RBC 5.40 (*)    MCV 67.8 (*)    MCH 22.8 (*)    All other components within normal limits  BASIC METABOLIC PANEL  TROPONIN I    EKG  EKG Interpretation  Date/Time:  Tuesday February 18 2016 16:22:05 EST Ventricular Rate:  82 PR Interval:  174 QRS Duration: 84 QT Interval:  372 QTC Calculation: 434 R Axis:   -20 Text Interpretation:  Normal sinus rhythm Minimal voltage criteria for LVH, may be normal variant Cannot rule out Anterior infarct , age undetermined Abnormal ECG similar to prior EKG  Confirmed by LIU MD, DANA  [phone]) on 02/18/2016 4:30:58 PM       Radiology Dg Chest 2 View  Result Date: 02/18/2016 CLINICAL DATA:  55 year old presenting with a 2 day history of central chest pain radiating to the back associated with intermittent headache. Current history of hypertension. Nonsmoker. EXAM: CHEST  2 VIEW COMPARISON:  03/02/2008. FINDINGS: Cardiomediastinal silhouette normal in appearance, unchanged. Lungs clear. Bronchovascular markings normal. Pulmonary vascularity normal. No visible pleural effusions. No pneumothorax. Mild degenerative changes involving the thoracic spine with slight thoracic dextroscoliosis. IMPRESSION: No acute cardiopulmonary disease. Normal-appearing cardiomediastinal silhouette. Electronically Signed   By: Evangeline Dakin M.D.   On: 02/18/2016 16:49    Procedures Procedures (including critical care time)  Medications Ordered in ED Medications - No data to display   Initial Impression / Assessment and Plan / ED Course  I have reviewed the triage vital signs and the nursing notes.  Pertinent labs & imaging results that were available during my care of the patient were reviewed by me and considered in my medical decision making (see chart for details).  Clinical Course     Medications - No data to display  Patient Vitals for the past 24 hrs:  BP Temp Temp src Pulse Resp SpO2 Height Weight  02/18/16 2030 132/83 - - 72 12 99 % - -  02/18/16 2001 128/84 97.6 F (36.4 C) Oral 72 15 100 % - -  02/18/16 2000 132/86 - - 72 12 99 % - -  02/18/16 1945 - - - 71 13 98 % - -  02/18/16 1930 128/84 - - 75 10 100 % - -  02/18/16 1915 - - - 73 11 99 % - -  02/18/16 1900 126/87 - - 72 (!) 9 100 % - -  02/18/16 1830 137/87 - - 72 (!) 9 99 % - -  02/18/16 1806 140/89 - - 72 14 100 % - -  02/18/16 1625 151/97 98.2 F (36.8 C) Oral 89 18 99 % 5\' 4"  (1.626 m) 164 lb (74.4 kg)    At discharge- Reevaluation with update and discussion. After initial assessment and treatment, an  updated evaluation reveals she is comfortable. Chest pain resolved. No additional complaints. Findings discussed with patient, all questions answered. Zaharah Amir L    Final Clinical Impressions(s) / ED Diagnoses   Final diagnoses:  Nonspecific chest pain   Noncardiac chest pain, with low risk cardiac profile. Doubt ACS, PE or pneumonia.  Nursing Notes Reviewed/ Care Coordinated Applicable Imaging Reviewed Interpretation of Laboratory Data incorporated into ED treatment  The patient appears reasonably screened and/or stabilized for discharge and I doubt any other medical condition or other [facility] requiring further screening, evaluation, or treatment in the ED at this time prior  to discharge.  Plan: Home Medications- continue; Home Treatments- rest; return here if the recommended treatment, does not improve the symptoms; Recommended follow up- PCP prn   New Prescriptions Discharge Medication List as of 02/18/2016  8:22 PM       Daleen Bo, MD 02/18/16 2355

## 2016-02-18 NOTE — ED Notes (Signed)
Patient in no acute distress. States she went to football game a few nights ago and was out in the cold. Denies any cold or flu like symptoms. 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 Unsure of when pain in mid chest started. No accompanying symptoms.

## 2016-03-09 ENCOUNTER — Other Ambulatory Visit: Payer: Self-pay

## 2016-03-09 MED ORDER — FENOFIBRATE 54 MG PO TABS: 54.0000 mg | ORAL_TABLET | Freq: Every day | ORAL | 5 refills | Status: DC

## 2016-03-09 MED ORDER — AMLODIPINE BESYLATE 2.5 MG PO TABS: 2.5000 mg | ORAL_TABLET | Freq: Every day | ORAL | 5 refills | Status: DC

## 2016-05-11 ENCOUNTER — Telehealth: Payer: Self-pay

## 2016-05-11 DIAGNOSIS — I10 Essential (primary) hypertension: Secondary | ICD-10-CM

## 2016-05-11 DIAGNOSIS — E782 Mixed hyperlipidemia: Secondary | ICD-10-CM

## 2016-05-11 NOTE — Telephone Encounter (Signed)
Labs ordered prior to next appointment

## 2016-06-25 ENCOUNTER — Ambulatory Visit: Payer: 59 | Admitting: Family Medicine

## 2016-06-28 LAB — COMPREHENSIVE METABOLIC PANEL
ALBUMIN: 4.4 g/dL (ref 3.6–5.1)
ALT: 15 U/L (ref 6–29)
AST: 16 U/L (ref 10–35)
Alkaline Phosphatase: 76 U/L (ref 33–130)
BILIRUBIN TOTAL: 0.3 mg/dL (ref 0.2–1.2)
BUN: 11 mg/dL (ref 7–25)
CALCIUM: 9.8 mg/dL (ref 8.6–10.4)
CO2: 24 mmol/L (ref 20–31)
CREATININE: 0.87 mg/dL (ref 0.50–1.05)
Chloride: 107 mmol/L (ref 98–110)
Glucose, Bld: 108 mg/dL — ABNORMAL HIGH (ref 65–99)
Potassium: 4.6 mmol/L (ref 3.5–5.3)
SODIUM: 141 mmol/L (ref 135–146)
TOTAL PROTEIN: 7.3 g/dL (ref 6.1–8.1)

## 2016-06-28 LAB — LIPID PANEL
CHOL/HDL RATIO: 2.7 ratio (ref <5.0)
CHOLESTEROL: 210 mg/dL — AB (ref <200)
HDL: 79 mg/dL (ref >50)
LDL Cholesterol: 121 mg/dL — ABNORMAL HIGH (ref <100)
Triglycerides: 48 mg/dL (ref <150)
VLDL: 10 mg/dL (ref <30)

## 2016-07-22 ENCOUNTER — Other Ambulatory Visit: Payer: Self-pay | Admitting: Family Medicine

## 2016-07-22 DIAGNOSIS — Z1231 Encounter for screening mammogram for malignant neoplasm of breast: Secondary | ICD-10-CM

## 2016-08-20 ENCOUNTER — Ambulatory Visit (INDEPENDENT_AMBULATORY_CARE_PROVIDER_SITE_OTHER): Payer: Self-pay | Admitting: Family Medicine

## 2016-08-20 VITALS — BP 120/88 | HR 71 | Resp 16 | Ht 64.0 in | Wt 165.0 lb

## 2016-08-20 DIAGNOSIS — E782 Mixed hyperlipidemia: Secondary | ICD-10-CM

## 2016-08-20 DIAGNOSIS — Z1211 Encounter for screening for malignant neoplasm of colon: Secondary | ICD-10-CM

## 2016-08-20 DIAGNOSIS — I10 Essential (primary) hypertension: Secondary | ICD-10-CM

## 2016-08-20 NOTE — Patient Instructions (Signed)
F/u in 7 month, call if you need me before  No changes in medication  You are able to get a free mammogram through the [facility] health system  You just need to apply through the system  We are here for you

## 2016-08-24 ENCOUNTER — Encounter: Payer: Self-pay | Admitting: Family Medicine

## 2016-08-24 ENCOUNTER — Telehealth: Payer: Self-pay | Admitting: Family Medicine

## 2016-08-24 DIAGNOSIS — Z1211 Encounter for screening for malignant neoplasm of colon: Secondary | ICD-10-CM | POA: Insufficient documentation

## 2016-08-24 NOTE — Assessment & Plan Note (Signed)
Improved Hyperlipidemia:Low fat diet discussed and encouraged.   Lipid Panel  Lab Results  Component Value Date   CHOL 210 (H) 06/27/2016   HDL 79 06/27/2016   LDLCALC 121 (H) 06/27/2016   TRIG 48 06/27/2016   CHOLHDL 2.7 06/27/2016   No med change

## 2016-08-24 NOTE — Assessment & Plan Note (Signed)
Controlled, no change in medication DASH diet and commitment to daily physical activity for a minimum of 30 minutes discussed and encouraged, as a part of hypertension management. The importance of attaining a healthy weight is also discussed.  BP/Weight 08/20/2016 02/18/2016 02/11/2016 08/15/2015 03/20/2015 01/29/2015 1/65/7903  Systolic BP 833 383 291 916 606 004 599  Diastolic BP 88 83 74 76 90 82 80  Wt. (Lbs) 165 164 164 159 - 162 162.12  BMI 28.32 28.15 28.15 27.28 - 27.79 27.81

## 2016-08-24 NOTE — Assessment & Plan Note (Signed)
Possible Heme positive stool at visit, pt to return 3 stool cards to verify, if positive, will need I referral will need to go through free clinic for price break for this

## 2016-08-24 NOTE — Progress Notes (Signed)
    Cindy Weiss     MRN: [id number]      DOB: 07-06-1960  HPI: Patient is in for annual physical exam. Pt is distraught as she has lost health insurance and is concerned re payment of basic services  I have advised her of availability of free mammograms, and her pap is not needed this year. She has no pelvic complaints , so pelvic exam not done PE:  BP 120/88   Pulse 71   Resp 16   Ht 5\' 4"  (1.626 m)   Wt 165 lb (74.8 kg)   SpO2 100%   BMI 28.32 kg/m   Pleasant  female, alert and oriented x 3, in no cardio-pulmonary distress. Afebrile. HEENT No facial trauma or asymetry. Sinuses non tender.  Extra occullar muscles intact,  External ears normal, tympanic membranes clear. Oropharynx moist, no exudate. Neck: supple, no adenopathy,JVD or thyromegaly.No bruits.  Chest: Clear to ascultation bilaterally.No crackles or wheezes. Non tender to palpation  Breast: No asymetry,no masses or lumps. No tenderness. No nipple discharge or inversion. No axillary or supraclavicular adenopathy  Cardiovascular system; Heart sounds normal,  S1 and  S2 ,no S3.  No murmur, or thrill. Apical beat not displaced Peripheral pulses normal.  Abdomen: Soft, non tender, no organomegaly or masses. No bruits. Bowel sounds normal. No guarding, tenderness or rebound.  Rectal:  Normal sphincter tone. No rectal mass. Guaiac positive  stool.  GU: Not done  Musculoskeletal exam: Full ROM of spine, hips , shoulders and knees. No deformity ,swelling or crepitus noted. No muscle wasting or atrophy.   Neurologic: Cranial nerves 2 to 12 intact. Power, tone ,sensation and reflexes normal throughout. No disturbance in gait. No tremor.  Skin: Intact, no ulceration, erythema , scaling or rash noted. Pigmentation normal throughout  Psych; Normal mood and affect. Judgement and concentration normal   Assessment & Plan:  Hyperlipidemia Improved Hyperlipidemia:Low fat diet discussed and  encouraged.   Lipid Panel  Lab Results  Component Value Date   CHOL 210 (H) 06/27/2016   HDL 79 06/27/2016   LDLCALC 121 (H) 06/27/2016   TRIG 48 06/27/2016   CHOLHDL 2.7 06/27/2016   No med change    HTN (hypertension) Controlled, no change in medication DASH diet and commitment to daily physical activity for a minimum of 30 minutes discussed and encouraged, as a part of hypertension management. The importance of attaining a healthy weight is also discussed.  BP/Weight 08/20/2016 02/18/2016 02/11/2016 08/15/2015 03/20/2015 01/29/2015 6/76/7209  Systolic BP 470 962 836 629 476 546 503  Diastolic BP 88 83 74 76 90 82 80  Wt. (Lbs) 165 164 164 159 - 162 162.12  BMI 28.32 28.15 28.15 27.28 - 27.79 27.81       Colon cancer screening Possible Heme positive stool at visit, pt to return 3 stool cards to verify, if positive, will need I referral will need to go through free clinic for price break for this

## 2016-08-24 NOTE — Telephone Encounter (Signed)
pls ask pt to bring in 3 correctly collected stool cards for testing for hidden blood ,  And if any of them is positive she will need GI evaluation, pls  Also specifically notify me with a flag as an "extra" when the results are available, thanks

## 2016-08-28 NOTE — Telephone Encounter (Signed)
Left message with her husband that I had something here for her to collect and to come by the office today or Monday

## 2016-09-07 ENCOUNTER — Ambulatory Visit (HOSPITAL_COMMUNITY): Payer: 59

## 2016-09-22 ENCOUNTER — Other Ambulatory Visit: Payer: Self-pay | Admitting: Family Medicine

## 2016-09-22 ENCOUNTER — Other Ambulatory Visit: Payer: Self-pay

## 2016-09-22 MED ORDER — FENOFIBRATE 54 MG PO TABS: 54.0000 mg | ORAL_TABLET | Freq: Every day | ORAL | 5 refills | Status: DC

## 2016-09-22 MED ORDER — AMLODIPINE BESYLATE 2.5 MG PO TABS: 2.5000 mg | ORAL_TABLET | Freq: Every day | ORAL | 5 refills | Status: DC

## 2016-09-22 NOTE — Telephone Encounter (Signed)
Cindy Weiss is asking for a refill on amLODipine (NORVASC) 2.5 MG tablet  And fenofibrate 54 MG tablet to [facility]

## 2016-10-06 ENCOUNTER — Other Ambulatory Visit: Payer: Self-pay | Admitting: Family Medicine

## 2016-10-06 ENCOUNTER — Ambulatory Visit (HOSPITAL_COMMUNITY)
Admission: EM | Admit: 2016-10-06 | Discharge: 2016-10-06 | Disposition: A | Discharge status: Home / Self Care | Payer: Self-pay

## 2016-10-06 ENCOUNTER — Ambulatory Visit
Admission: RE | Admit: 2016-10-06 | Discharge: 2016-10-06 | Disposition: A | Discharge status: Home / Self Care | Payer: Worker's Compensation | Source: Ambulatory Visit | Attending: Family Medicine | Admitting: Family Medicine

## 2016-10-06 DIAGNOSIS — W19XXXA Unspecified fall, initial encounter: Secondary | ICD-10-CM

## 2016-10-06 DIAGNOSIS — M79602 Pain in left arm: Secondary | ICD-10-CM

## 2016-12-21 ENCOUNTER — Telehealth: Payer: Self-pay | Admitting: Family Medicine

## 2016-12-21 DIAGNOSIS — E782 Mixed hyperlipidemia: Secondary | ICD-10-CM

## 2016-12-21 DIAGNOSIS — R7303 Prediabetes: Secondary | ICD-10-CM

## 2016-12-21 DIAGNOSIS — I10 Essential (primary) hypertension: Secondary | ICD-10-CM

## 2016-12-21 NOTE — Telephone Encounter (Signed)
Patient came by to get appt reminder and would like to know when her blood work should be done.  She is scheduled for Jan 7th. 2019 @ 4pm to see Dr.  Moshe Cipro.  She is going to try and schedule her mammogram today so that she will get in before her next ov with Dr. Moshe Cipro.  Please let her know about labs.    cb  [phone] Or [phone]

## 2016-12-28 NOTE — Telephone Encounter (Signed)
She will need, cBC, fasting lipid, cmp and eGFR, hBA1C , tSH

## 2016-12-28 NOTE — Telephone Encounter (Signed)
Called patient regarding message below. No answer, left generic message for patient to return call.   

## 2017-01-11 ENCOUNTER — Ambulatory Visit (HOSPITAL_COMMUNITY): Payer: Self-pay

## 2017-04-05 ENCOUNTER — Ambulatory Visit: Payer: Self-pay | Admitting: Family Medicine

## 2017-04-05 ENCOUNTER — Other Ambulatory Visit: Payer: Self-pay

## 2017-04-05 LAB — HEMOGLOBIN A1C
Hgb A1c MFr Bld: 5.8 % of total Hgb — ABNORMAL HIGH (ref <5.7)
MEAN PLASMA GLUCOSE: 120 (calc)
eAG (mmol/L): 6.6 (calc)

## 2017-04-05 LAB — CBC
HCT: 37.2 % (ref 35.0–45.0)
HEMOGLOBIN: 12 g/dL (ref 11.7–15.5)
MCH: 22.5 pg — ABNORMAL LOW (ref 27.0–33.0)
MCHC: 32.3 g/dL (ref 32.0–36.0)
MCV: 69.7 fL — ABNORMAL LOW (ref 80.0–100.0)
MPV: 10 fL (ref 7.5–12.5)
Platelets: 352 10*3/uL (ref 140–400)
RBC: 5.34 10*6/uL — AB (ref 3.80–5.10)
RDW: 15.2 % — ABNORMAL HIGH (ref 11.0–15.0)
WBC: 6.3 10*3/uL (ref 3.8–10.8)

## 2017-04-05 LAB — COMPLETE METABOLIC PANEL WITH GFR
AG RATIO: 1.5 (calc) (ref 1.0–2.5)
ALT: 28 U/L (ref 6–29)
AST: 22 U/L (ref 10–35)
Albumin: 4.5 g/dL (ref 3.6–5.1)
Alkaline phosphatase (APISO): 86 U/L (ref 33–130)
BUN: 17 mg/dL (ref 7–25)
CALCIUM: 10.3 mg/dL (ref 8.6–10.4)
CO2: 27 mmol/L (ref 20–32)
Chloride: 104 mmol/L (ref 98–110)
Creat: 0.86 mg/dL (ref 0.50–1.05)
GFR, EST NON AFRICAN AMERICAN: 75 mL/min/{1.73_m2} (ref >=60)
GFR, Est African American: 87 mL/min/{1.73_m2} (ref >=60)
GLOBULIN: 3.1 g/dL (ref 1.9–3.7)
Glucose, Bld: 92 mg/dL (ref 65–99)
POTASSIUM: 4.2 mmol/L (ref 3.5–5.3)
SODIUM: 139 mmol/L (ref 135–146)
Total Bilirubin: 0.3 mg/dL (ref 0.2–1.2)
Total Protein: 7.6 g/dL (ref 6.1–8.1)

## 2017-04-05 LAB — LIPID PANEL
CHOL/HDL RATIO: 2.7 (calc) (ref <5.0)
CHOLESTEROL: 255 mg/dL — AB (ref <200)
HDL: 94 mg/dL (ref >50)
LDL Cholesterol (Calc): 144 mg/dL (calc) — ABNORMAL HIGH
Non-HDL Cholesterol (Calc): 161 mg/dL (calc) — ABNORMAL HIGH (ref <130)
Triglycerides: 70 mg/dL (ref <150)

## 2017-04-05 LAB — TSH: TSH: 0.86 mIU/L (ref 0.40–4.50)

## 2017-04-05 MED ORDER — AMLODIPINE BESYLATE 2.5 MG PO TABS: 2.5000 mg | ORAL_TABLET | Freq: Every day | ORAL | 1 refills | Status: DC

## 2017-04-05 MED ORDER — FENOFIBRATE 54 MG PO TABS: 54.0000 mg | ORAL_TABLET | Freq: Every day | ORAL | 1 refills | Status: DC

## 2017-04-06 ENCOUNTER — Encounter: Payer: Self-pay | Admitting: Family Medicine

## 2017-04-07 ENCOUNTER — Emergency Department (HOSPITAL_COMMUNITY): Payer: Self-pay

## 2017-04-07 ENCOUNTER — Inpatient Hospital Stay (HOSPITAL_COMMUNITY)
Admission: EM | Admit: 2017-04-07 | Discharge: 2017-04-09 | DRG: 309 | Disposition: A | Discharge status: Home / Self Care | Payer: Self-pay | Attending: Internal Medicine | Admitting: Internal Medicine

## 2017-04-07 ENCOUNTER — Encounter (HOSPITAL_COMMUNITY): Payer: Self-pay | Admitting: Emergency Medicine

## 2017-04-07 ENCOUNTER — Other Ambulatory Visit: Payer: Self-pay

## 2017-04-07 DIAGNOSIS — Z8249 Family history of ischemic heart disease and other diseases of the circulatory system: Secondary | ICD-10-CM

## 2017-04-07 DIAGNOSIS — K5900 Constipation, unspecified: Secondary | ICD-10-CM | POA: Diagnosis present

## 2017-04-07 DIAGNOSIS — Z888 Allergy status to other drugs, medicaments and biological substances status: Secondary | ICD-10-CM

## 2017-04-07 DIAGNOSIS — Z8349 Family history of other endocrine, nutritional and metabolic diseases: Secondary | ICD-10-CM

## 2017-04-07 DIAGNOSIS — K921 Melena: Secondary | ICD-10-CM | POA: Diagnosis present

## 2017-04-07 DIAGNOSIS — Z79899 Other long term (current) drug therapy: Secondary | ICD-10-CM

## 2017-04-07 DIAGNOSIS — E782 Mixed hyperlipidemia: Secondary | ICD-10-CM

## 2017-04-07 DIAGNOSIS — K649 Unspecified hemorrhoids: Secondary | ICD-10-CM | POA: Diagnosis present

## 2017-04-07 DIAGNOSIS — R0989 Other specified symptoms and signs involving the circulatory and respiratory systems: Secondary | ICD-10-CM | POA: Diagnosis present

## 2017-04-07 DIAGNOSIS — R7302 Impaired glucose tolerance (oral): Secondary | ICD-10-CM

## 2017-04-07 DIAGNOSIS — I480 Paroxysmal atrial fibrillation: Secondary | ICD-10-CM | POA: Diagnosis present

## 2017-04-07 DIAGNOSIS — I4891 Unspecified atrial fibrillation: Principal | ICD-10-CM | POA: Diagnosis present

## 2017-04-07 DIAGNOSIS — E785 Hyperlipidemia, unspecified: Secondary | ICD-10-CM | POA: Diagnosis present

## 2017-04-07 DIAGNOSIS — I10 Essential (primary) hypertension: Secondary | ICD-10-CM

## 2017-04-07 HISTORY — DX: Hyperlipidemia, unspecified: E78.5

## 2017-04-07 HISTORY — DX: Impaired glucose tolerance (oral): R73.02

## 2017-04-07 LAB — I-STAT TROPONIN, ED: Troponin i, poc: 0 ng/mL (ref 0.00–0.08)

## 2017-04-07 LAB — BASIC METABOLIC PANEL
Anion gap: 12 (ref 5–15)
BUN: 15 mg/dL (ref 6–20)
CHLORIDE: 103 mmol/L (ref 101–111)
CO2: 25 mmol/L (ref 22–32)
CREATININE: 0.87 mg/dL (ref 0.44–1.00)
Calcium: 10.2 mg/dL (ref 8.9–10.3)
GFR calc Af Amer: >60 mL/min (ref >60)
GFR calc non Af Amer: >60 mL/min (ref >60)
Glucose, Bld: 133 mg/dL — ABNORMAL HIGH (ref 65–99)
Potassium: 4 mmol/L (ref 3.5–5.1)
SODIUM: 140 mmol/L (ref 135–145)

## 2017-04-07 LAB — CBC WITH DIFFERENTIAL/PLATELET
BASOS PCT: 0 %
Basophils Absolute: 0 10*3/uL (ref 0.0–0.1)
EOS ABS: 0.1 10*3/uL (ref 0.0–0.7)
Eosinophils Relative: 1 %
HCT: 40.4 % (ref 36.0–46.0)
HEMOGLOBIN: 12.7 g/dL (ref 12.0–15.0)
LYMPHS ABS: 2 10*3/uL (ref 0.7–4.0)
LYMPHS PCT: 24 %
MCH: 21.7 pg — AB (ref 26.0–34.0)
MCHC: 31.4 g/dL (ref 30.0–36.0)
MCV: 69.2 fL — ABNORMAL LOW (ref 78.0–100.0)
Monocytes Absolute: 0.4 10*3/uL (ref 0.1–1.0)
Monocytes Relative: 5 %
NEUTROS ABS: 5.7 10*3/uL (ref 1.7–7.7)
Neutrophils Relative %: 70 %
Platelets: 335 10*3/uL (ref 150–400)
RBC: 5.84 MIL/uL — ABNORMAL HIGH (ref 3.87–5.11)
RDW: 14.9 % (ref 11.5–15.5)
WBC: 8.2 10*3/uL (ref 4.0–10.5)

## 2017-04-07 LAB — MRSA PCR SCREENING: MRSA BY PCR: NEGATIVE

## 2017-04-07 MED ORDER — DILTIAZEM HCL 25 MG/5ML IV SOLN
10.0000 mg | Freq: Once | INTRAVENOUS | Status: AC
  Administered 2017-04-07: 10 mg via INTRAVENOUS
  Filled 2017-04-07: qty 5

## 2017-04-07 MED ORDER — HEPARIN (PORCINE) IN NACL 100-0.45 UNIT/ML-% IJ SOLN
1200.0000 [IU]/h | INTRAMUSCULAR | Status: DC
  Administered 2017-04-07: 1200 [IU]/h via INTRAVENOUS
  Filled 2017-04-07: qty 250

## 2017-04-07 MED ORDER — FENOFIBRATE 54 MG PO TABS
54.0000 mg | ORAL_TABLET | Freq: Every evening | ORAL | Status: DC
  Administered 2017-04-07 – 2017-04-08 (×2): 54 mg via ORAL
  Filled 2017-04-07 (×5): qty 1

## 2017-04-07 MED ORDER — HEPARIN BOLUS VIA INFUSION
4000.0000 [IU] | Freq: Once | INTRAVENOUS | Status: AC
  Administered 2017-04-07: 4000 [IU] via INTRAVENOUS
  Filled 2017-04-07: qty 4000

## 2017-04-07 MED ORDER — SODIUM CHLORIDE 0.9 % IV SOLN
INTRAVENOUS | Status: DC
  Administered 2017-04-07 – 2017-04-09 (×3): via INTRAVENOUS

## 2017-04-07 MED ORDER — DILTIAZEM HCL-DEXTROSE 100-5 MG/100ML-% IV SOLN (PREMIX)
5.0000 mg/h | Freq: Once | INTRAVENOUS | Status: AC
  Administered 2017-04-07: 5 mg/h via INTRAVENOUS
  Filled 2017-04-07: qty 100

## 2017-04-07 MED ORDER — ONDANSETRON HCL 4 MG/2ML IJ SOLN: 4.0000 mg | Freq: Four times a day (QID) | INTRAMUSCULAR | Status: DC | PRN

## 2017-04-07 MED ORDER — ONDANSETRON HCL 4 MG PO TABS: 4.0000 mg | ORAL_TABLET | Freq: Four times a day (QID) | ORAL | Status: DC | PRN

## 2017-04-07 MED ORDER — DILTIAZEM HCL-DEXTROSE 100-5 MG/100ML-% IV SOLN (PREMIX)
5.0000 mg/h | Freq: Once | INTRAVENOUS | Status: DC
  Filled 2017-04-07: qty 100

## 2017-04-07 MED ORDER — SODIUM CHLORIDE 0.9 % IV BOLUS (SEPSIS)
500.0000 mL | Freq: Once | INTRAVENOUS | Status: AC
  Administered 2017-04-07: 500 mL via INTRAVENOUS

## 2017-04-07 MED ORDER — ADENOSINE 6 MG/2ML IV SOLN
INTRAVENOUS | Status: AC
  Filled 2017-04-07: qty 6

## 2017-04-07 MED ORDER — ACETAMINOPHEN 650 MG RE SUPP: 650.0000 mg | Freq: Four times a day (QID) | RECTAL | Status: DC | PRN

## 2017-04-07 MED ORDER — ACETAMINOPHEN 325 MG PO TABS: 650.0000 mg | ORAL_TABLET | Freq: Four times a day (QID) | ORAL | Status: DC | PRN

## 2017-04-07 NOTE — ED Provider Notes (Signed)
[facility] EMERGENCY DEPARTMENT Provider Note   CSN: [id number] Arrival date & time: 04/07/17  1937     History   Chief Complaint Chief Complaint  Patient presents with  . Chest Pain    HPI Cindy Weiss is a 57 y.o. female.  Patient has felt as if her heart is been fluttering for the past 3 days.  During the night patient felt some chest discomfort when she would get up she felt as if she was going to pass out.  But no actual syncope.  Also some intermittent shortness of breath.  Patient does give a history of blood in her bowel movements starting 2 weeks ago but none lately.  No history of cardiac problems.  States she did have an abnormal heartbeat when she was very young.      Past Medical History:  Diagnosis Date  . Anemia   . Fibroid tumor   . Fibroids   . High cholesterol   . Hypertension     Patient Active Problem List   Diagnosis Date Noted  . Colon cancer screening 08/24/2016  . Prediabetes 06/02/2012  . HTN (hypertension) 12/08/2010  . Overweight 09/21/2010  . FIBROIDS, UTERUS 03/04/2009  . Hyperlipidemia 02/02/2008    Past Surgical History:  Procedure Laterality Date  . BREAST LUMPECTOMY     left breast   . fibroid tumor removal    . mymectomy      OB History    No data available       Home Medications    Prior to Admission medications   Medication Sig Start Date End Date Taking? Authorizing Provider  amLODipine (NORVASC) 2.5 MG tablet Take 1 tablet (2.5 mg total) by mouth daily. 04/05/17  Yes Fayrene Helper, MD  calcium carbonate (OS-CAL - DOSED IN MG OF ELEMENTAL CALCIUM) 1250 (500 Ca) MG tablet Take 2 tablets by mouth daily with breakfast.   Yes [provider]  fenofibrate 54 MG tablet Take 1 tablet (54 mg total) by mouth daily. Patient taking differently: Take 54 mg by mouth every evening.  04/05/17  Yes Fayrene Helper, MD  Multiple Vitamins-Minerals (ALIVE WOMENS 50+ PO) Take 1 tablet by mouth daily.   Yes [provider]    Family History Family History  Problem Relation Age of Onset  . Hyperlipidemia Mother   . Thyroid disease Father   . Hypertension Sister     Social History Social History   Tobacco Use  . Smoking status: Never Smoker  . Smokeless tobacco: Never Used  Substance Use Topics  . Alcohol use: No  . Drug use: No     Allergies   Pravastatin   Review of Systems Review of Systems  Constitutional: Positive for fatigue.  HENT: Negative for congestion.   Eyes: Negative for redness.  Respiratory: Positive for shortness of breath.   Cardiovascular: Positive for chest pain and palpitations. Negative for leg swelling.  Gastrointestinal: Negative for abdominal pain.  Genitourinary: Negative for dysuria.  Musculoskeletal: Negative for back pain.  Skin: Negative for rash.  Neurological: Positive for light-headedness. Negative for syncope.  Hematological: Does not bruise/bleed easily.  Psychiatric/Behavioral: Negative for confusion.     Physical Exam Updated Vital Signs BP (!) 116/95   Pulse 99   Temp 98.5 F (36.9 C) (Oral)   Resp 17   SpO2 100%   Physical Exam  Constitutional: She is oriented to person, place, and time. She appears well-developed and well-nourished. No distress.  HENT:  Head: Normocephalic and atraumatic.  Mouth/Throat: Oropharynx is clear and moist.  Eyes: Conjunctivae and EOM are normal. Pupils are equal, round, and reactive to light.  Neck: Normal range of motion. Neck supple.  Cardiovascular: Normal heart sounds.  Rapid irregular rate  Pulmonary/Chest: Effort normal and breath sounds normal. No respiratory distress.  Abdominal: Soft. Bowel sounds are normal. There is no tenderness.  Musculoskeletal: Normal range of motion. She exhibits no edema.  Neurological: She is alert and oriented to person, place, and time. No cranial nerve deficit or sensory deficit. She exhibits normal muscle tone. Coordination normal.  Skin: Skin is warm.    Nursing note and vitals reviewed.    ED Treatments / Results  Labs (all labs ordered are listed, but only abnormal results are displayed) Labs Reviewed  CBC WITH DIFFERENTIAL/PLATELET - Abnormal; Notable for the following components:      Result Value   RBC 5.84 (*)    MCV 69.2 (*)    MCH 21.7 (*)    All other components within normal limits  BASIC METABOLIC PANEL - Abnormal; Notable for the following components:   Glucose, Bld 133 (*)    All other components within normal limits  I-STAT TROPONIN, ED    EKG  EKG Interpretation  Date/Time:  Wednesday April 07 2017 10:17:50 EST Ventricular Rate:  149 PR Interval:    QRS Duration: 99 QT Interval:  304 QTC Calculation: 446 R Axis:   1 Text Interpretation:  Atrial fibrillation Low voltage, precordial leads Abnormal R-wave progression, early transition Consider anterior infarct Confirmed by Fredia Sorrow [phone]) on 04/07/2017 10:25:52 AM       Radiology Dg Chest Portable 1 View  Result Date: 04/07/2017 CLINICAL DATA:  Acute chest pain and shortness of breath for 1 day. EXAM: PORTABLE CHEST 1 VIEW COMPARISON:  02/18/2016 and prior radiographs FINDINGS: The cardiomediastinal silhouette is unremarkable. Pulmonary vascular congestion noted. There is no evidence of focal airspace disease, pulmonary edema, suspicious pulmonary nodule/mass, pleural effusion, or pneumothorax. No acute bony abnormalities are identified. IMPRESSION: Pulmonary vascular congestion. Electronically Signed   By: Margarette Canada M.D.   On: 04/07/2017 10:52    Procedures Procedures (including critical care time)  CRITICAL CARE Performed by: Fredia Sorrow Total critical care time: 30 minutes Critical care time was exclusive of separately billable procedures and treating other patients. Critical care was necessary to treat or prevent imminent or life-threatening deterioration. Critical care was time spent personally by me on the following activities:  development of treatment plan with patient and/or surrogate as well as nursing, discussions with consultants, evaluation of patient's response to treatment, examination of patient, obtaining history from patient or surrogate, ordering and performing treatments and interventions, ordering and review of laboratory studies, ordering and review of radiographic studies, pulse oximetry and re-evaluation of patient's condition.  Medications Ordered in ED Medications  adenosine (ADENOCARD) 6 MG/2ML injection (not administered)  0.9 %  sodium chloride infusion ( Intravenous New Bag/Given 04/07/17 1058)  diltiazem (CARDIZEM) injection 10 mg (10 mg Intravenous Given 04/07/17 1058)  sodium chloride 0.9 % bolus 500 mL (0 mLs Intravenous Stopped 04/07/17 1229)  diltiazem (CARDIZEM) 100 mg in dextrose 5% 173mL (1 mg/mL) infusion (5 mg/hr Intravenous New Bag/Given 04/07/17 1225)     Initial Impression / Assessment and Plan / ED Course  I have reviewed the triage vital signs and the nursing notes.  Pertinent labs & imaging results that were available during my care of the patient were reviewed by me and considered in  my medical decision making (see chart for details).     Patient with new onset atrial fibrillation.  Patient may have had symptoms for the past 3 days.  Got worse today when she stood up she felt like she was going to pass out.  Heart rate upon arrival was approximately 150 bpm.  Patient received 10 mg grams of diltiazem as a bolus brought heart rate down to 100 and then it slowly drifted back up.  Now started on a drip.  Chest x-ray shows some mild pulmonary vascular congestion.  No florid pulmonary edema troponin was negative.  Patient feeling better with heart rate slowed down.  Discussed with hospitalist.  They will admit.   Final Clinical Impressions(s) / ED Diagnoses   Final diagnoses:  Atrial fibrillation with [facility])    ED Discharge Orders    None       Fredia Sorrow, MD 04/07/17  1253

## 2017-04-07 NOTE — ED Triage Notes (Signed)
Pt c/o chest pressure and sob started this am. Pt arrived tearful. No resp distress or sob noted. Non diaphoretic. Denies n/v/d. C/o dizziness.

## 2017-04-07 NOTE — ED Notes (Signed)
Admitting doctor at bedside 

## 2017-04-07 NOTE — H&P (Signed)
History and Physical  Cindy Weiss KPT:[id number] DOB: 12-Jul-1960 DOA: 04/07/2017   PCP: Fayrene Helper, MD   Patient coming from: Home  Chief Complaint: sob, heart fluttering  HPI:  Cindy Weiss is a 57 y.o. female with medical history of hypertension presenting with 3-day history of heart fluttering sensation.  She states that it has been intermittent up until the morning of 04/07/2017.  The patient was getting ready for work when her fluttering sensation became more constant associated with some shortness of breath, chest tightness, and "choking sensation".  The patient had a near syncopal episode when trying to go to the bathroom.  She felt better when she laid down, but, but her shortness of breath and fluttering sensation got worse when she got up to exert herself again.  As result, the patient presented to ED for further evaluation.  The patient denies any recent fever, chills, headache, neck pain, nausea, vomiting, diarrhea, abdominal pain.  She denies any new medications.  She has had some intermittent hematochezia in the recent past which she attributed to some hemorrhoids due to constipation.  She states that over 30 years ago she had to wear some type of heart monitor, but she does not recall anything became of it.  In the emergency department, the patient was noted to have atrial fibrillation with RVR with heart rate in the 150s.  The patient was hemodynamically stable saturating 100% on room air.  BMP and CBC were unremarkable.  Chest x-ray showed pulmonary vascular congestion without pulmonary edema.  The patient was given a diltiazem bolus and started on diltiazem drip.  Initial troponin was unremarkable.  Assessment/Plan: New-onset atrial fibrillation with RVR -CHADS-VASc = 2 to3 (HTN, female, impaired glucose tolerance without dx of DM) -Continue diltiazem drip -04/03/2017 TSH 0.86 -Echocardiogram -Start IV heparin -Cardiology consult  Essential  hypertension -Discontinue amlodipine -Continue diltiazem drip   Impaired glucose tolerance -04/03/17 A1C--5.8 -lifestyle modification  Hyperlipidemia -continue fenofibrate            Past Medical History:  Diagnosis Date  . Anemia   . Fibroid tumor   . Fibroids   . High cholesterol   . Hypertension    Past Surgical History:  Procedure Laterality Date  . BREAST LUMPECTOMY     left breast   . fibroid tumor removal    . mymectomy     Social History:  reports that  has never smoked. she has never used smokeless tobacco. She reports that she does not drink alcohol or use drugs.   Family History  Problem Relation Age of Onset  . Hyperlipidemia Mother   . Thyroid disease Father   . Hypertension Sister      Allergies  Allergen Reactions  . Pravastatin     REACTION: Muscle aches     Prior to Admission medications   Medication Sig Start Date End Date Taking? Authorizing Provider  amLODipine (NORVASC) 2.5 MG tablet Take 1 tablet (2.5 mg total) by mouth daily. 04/05/17  Yes Fayrene Helper, MD  calcium carbonate (OS-CAL - DOSED IN MG OF ELEMENTAL CALCIUM) 1250 (500 Ca) MG tablet Take 2 tablets by mouth daily with breakfast.   Yes [provider]  fenofibrate 54 MG tablet Take 1 tablet (54 mg total) by mouth daily. Patient taking differently: Take 54 mg by mouth every evening.  04/05/17  Yes Fayrene Helper, MD  Multiple Vitamins-Minerals (ALIVE WOMENS 50+ PO) Take 1 tablet by mouth daily.  Yes [provider]    Review of Systems:  Constitutional:  No weight loss, night sweats, Fevers, chills, fatigue.  Head&Eyes: No headache.  No vision loss.  No eye pain or scotoma ENT:  No Difficulty swallowing,Tooth/dental problems,Sore throat,  No ear ache, post nasal drip,  Cardio-vascular:  No chest pain, Orthopnea, PND, swelling in lower extremities,  dizziness, palpitations  GI:  No  abdominal pain, nausea, vomiting, diarrhea, loss of  appetite, hematochezia, melena, heartburn, indigestion, Resp:   No cough. No coughing up of blood .No wheezing.No chest wall deformity  Skin:  no rash or lesions.  GU:  no dysuria, change in color of urine, no urgency or frequency. No flank pain.  Musculoskeletal:  No joint pain or swelling. No decreased range of motion. No back pain.  Psych:  No change in mood or affect. No depression or anxiety. Neurologic: No headache, no dysesthesia, no focal weakness, no vision loss. No syncope  Physical Exam: Vitals:   04/07/17 1130 04/07/17 1145 04/07/17 1200 04/07/17 1230  BP: (!) 116/95  (!) 126/100 (!) 133/102  Pulse: 98 99 (!) 113 (!) 127  Resp: 16 17 (!) 21 15  Temp:      TempSrc:      SpO2: 99% 100% 100% 100%   General:  A&O x 3, NAD, nontoxic, pleasant/cooperative Head/Eye: No conjunctival hemorrhage, no icterus, [place]/AT, No nystagmus ENT:  No icterus,  No thrush, good dentition, no pharyngeal exudate Neck:  No masses, no lymphadenpathy, no bruits CV:  IRRR, no rub, no gallop, no S3 Lung: Bibasilar crackles but no wheezing.  Good air movement. Abdomen: soft/NT, +BS, nondistended, no peritoneal signs Ext: No cyanosis, No rashes, No petechiae, No lymphangitis, No edema Neuro: CNII-XII intact, strength 4/5 in bilateral upper and lower extremities, no dysmetria  Labs on Admission:  Basic Metabolic Panel: Recent Labs  Lab 04/03/17 0830 04/07/17 1020  NA 139 140  K 4.2 4.0  CL 104 103  CO2 27 25  GLUCOSE 92 133*  BUN 17 15  CREATININE 0.86 0.87  CALCIUM 10.3 10.2   Liver Function Tests: Recent Labs  Lab 04/03/17 0830  AST 22  ALT 28  BILITOT 0.3  PROT 7.6   No results for input(s): LIPASE, AMYLASE in the last 168 hours. No results for input(s): AMMONIA in the last 168 hours. CBC: Recent Labs  Lab 04/03/17 0830 04/07/17 1020  WBC 6.3 8.2  NEUTROABS  --  5.7  HGB 12.0 12.7  HCT 37.2 40.4  MCV 69.7* 69.2*  PLT 352 335   Coagulation Profile: No results for  input(s): INR, PROTIME in the last 168 hours. Cardiac Enzymes: No results for input(s): CKTOTAL, CKMB, CKMBINDEX, TROPONINI in the last 168 hours. BNP: Invalid input(s): POCBNP CBG: No results for input(s): GLUCAP in the last 168 hours. Urine analysis: No results found for: COLORURINE, APPEARANCEUR, LABSPEC, PHURINE, GLUCOSEU, HGBUR, BILIRUBINUR, KETONESUR, PROTEINUR, UROBILINOGEN, NITRITE, LEUKOCYTESUR Sepsis Labs: @LABRCNTIP (procalcitonin:4,lacticidven:4) )No results found for this or any previous visit (from the past 240 hour(s)).   Radiological Exams on Admission: Dg Chest Portable 1 View  Result Date: 04/07/2017 CLINICAL DATA:  Acute chest pain and shortness of breath for 1 day. EXAM: PORTABLE CHEST 1 VIEW COMPARISON:  02/18/2016 and prior radiographs FINDINGS: The cardiomediastinal silhouette is unremarkable. Pulmonary vascular congestion noted. There is no evidence of focal airspace disease, pulmonary edema, suspicious pulmonary nodule/mass, pleural effusion, or pneumothorax. No acute bony abnormalities are identified. IMPRESSION: Pulmonary vascular congestion. Electronically Signed   By: Dellis Filbert  Hu M.D.   On: 04/07/2017 10:52    EKG: Independently reviewed.  Atrial fibrillation, nonspecific ST changes.    Time spent:60 minutes Code Status:   FULL Family Communication:  Mother and father updated at bedside- Disposition Plan: expect 1-2 day hospitalization Consults called: cardiology DVT Prophylaxis: [place] Lovenox/Heparin   SCDs  Orson Eva, DO  [facility] Pager [phone]  If 7PM-7AM, please contact night-coverage [email or web address] Password [email or web address] 04/07/2017, 1:16 PM

## 2017-04-07 NOTE — Progress Notes (Signed)
ANTICOAGULATION CONSULT NOTE - Initial Consult  Pharmacy Consult for HEPARIN Indication: atrial fibrillation  Allergies  Allergen Reactions  . Pravastatin     REACTION: Muscle aches    Patient Measurements: Height: 5\' 5"  (165.1 cm) Weight: 168 lb 14 oz (76.6 kg) IBW/kg (Calculated) : 57 HEPARIN DW (KG): 72.9  Vital Signs: Temp: 98.2 F (36.8 C) (01/09 1659) Temp Source: Oral (01/09 1659) BP: 118/93 (01/09 1500) Pulse Rate: 83 (01/09 1500)  Labs: Recent Labs    04/07/17 1020  HGB 12.7  HCT 40.4  PLT 335  CREATININE 0.87    Estimated Creatinine Clearance: 73 mL/min (by C-G formula based on SCr of 0.87 mg/dL).   Medical History: Past Medical History:  Diagnosis Date  . Anemia   . Fibroid tumor   . Fibroids   . High cholesterol   . Hypertension     Medications:  Medications Prior to Admission  Medication Sig Dispense Refill Last Dose  . amLODipine (NORVASC) 2.5 MG tablet Take 1 tablet (2.5 mg total) by mouth daily. 30 tablet 1 04/07/2017 at Unknown time  . calcium carbonate (OS-CAL - DOSED IN MG OF ELEMENTAL CALCIUM) 1250 (500 Ca) MG tablet Take 2 tablets by mouth daily with breakfast.   Past Week at Unknown time  . fenofibrate 54 MG tablet Take 1 tablet (54 mg total) by mouth daily. (Patient taking differently: Take 54 mg by mouth every evening. ) 30 tablet 1 04/06/2017 at Unknown time  . Multiple Vitamins-Minerals (ALIVE WOMENS 50+ PO) Take 1 tablet by mouth daily.   Past Week at Unknown time    Assessment: 57yo female with c/o heart fluttering. Pt was noted to have afib with RVR.  Pharmacy asked to initiate Heparin protocol.   Goal of Therapy:  Heparin level 0.3-0.7 units/ml Monitor platelets by anticoagulation protocol: Yes   Plan:   Heparin 4000 units IV now x 1  Heparin infusion at 1200 units/hr  Heparin level daily  CBC daily while on Heparin   [place] Crane, Treyce Spillers A 04/07/2017,5:14 PM

## 2017-04-07 NOTE — ED Notes (Signed)
Pt currently resting in hallway bed. In NAD.

## 2017-04-08 ENCOUNTER — Observation Stay (HOSPITAL_COMMUNITY): Payer: Self-pay

## 2017-04-08 ENCOUNTER — Encounter (HOSPITAL_COMMUNITY): Payer: Self-pay | Admitting: Cardiology

## 2017-04-08 DIAGNOSIS — I340 Nonrheumatic mitral (valve) insufficiency: Secondary | ICD-10-CM

## 2017-04-08 LAB — ECHOCARDIOGRAM COMPLETE
Height: 65 in
WEIGHTICAEL: 2726.65 [oz_av]

## 2017-04-08 LAB — CBC
HCT: 35.8 % — ABNORMAL LOW (ref 36.0–46.0)
Hemoglobin: 11.5 g/dL — ABNORMAL LOW (ref 12.0–15.0)
MCH: 21.9 pg — ABNORMAL LOW (ref 26.0–34.0)
MCHC: 32.1 g/dL (ref 30.0–36.0)
MCV: 68.3 fL — ABNORMAL LOW (ref 78.0–100.0)
Platelets: 300 10*3/uL (ref 150–400)
RBC: 5.24 MIL/uL — AB (ref 3.87–5.11)
RDW: 14.7 % (ref 11.5–15.5)
WBC: 8 10*3/uL (ref 4.0–10.5)

## 2017-04-08 LAB — HEPARIN LEVEL (UNFRACTIONATED): HEPARIN UNFRACTIONATED: 0.49 [IU]/mL (ref 0.30–0.70)

## 2017-04-08 LAB — BASIC METABOLIC PANEL
Anion gap: 9 (ref 5–15)
BUN: 15 mg/dL (ref 6–20)
CALCIUM: 9.6 mg/dL (ref 8.9–10.3)
CO2: 25 mmol/L (ref 22–32)
Chloride: 108 mmol/L (ref 101–111)
Creatinine, Ser: 0.94 mg/dL (ref 0.44–1.00)
GFR calc Af Amer: >60 mL/min (ref >60)
Glucose, Bld: 105 mg/dL — ABNORMAL HIGH (ref 65–99)
Potassium: 4.1 mmol/L (ref 3.5–5.1)
SODIUM: 142 mmol/L (ref 135–145)

## 2017-04-08 LAB — HIV ANTIBODY (ROUTINE TESTING W REFLEX): HIV SCREEN 4TH GENERATION: NONREACTIVE

## 2017-04-08 MED ORDER — DILTIAZEM HCL ER COATED BEADS 120 MG PO CP24
120.0000 mg | ORAL_CAPSULE | Freq: Every day | ORAL | Status: DC
  Administered 2017-04-08 – 2017-04-09 (×2): 120 mg via ORAL
  Filled 2017-04-08 (×2): qty 1

## 2017-04-08 MED ORDER — APIXABAN 5 MG PO TABS
5.0000 mg | ORAL_TABLET | Freq: Two times a day (BID) | ORAL | Status: DC
  Administered 2017-04-08 – 2017-04-09 (×4): 5 mg via ORAL
  Filled 2017-04-08 (×4): qty 1

## 2017-04-08 NOTE — Consult Note (Signed)
Cardiology Consultation:   [name]; [id number]; [birth year]   Admit date: 04/07/2017 Date of Consult: 04/08/2017  Primary Care Provider: Fayrene Helper, MD Consulting Cardiologist: Dr. Satira Sark   Patient Profile:   Cindy Weiss is a 57 y.o. female with a history of hypertension, glucose intolerance, and hyperlipidemia who is being seen today for the evaluation of newly documented atrial fibrillation at the request of Dr. Jerilee Hoh.   History of Present Illness:   Cindy Weiss states that she developed a sense of fluttering in her chest earlier yesterday morning when she woke up, later began to experience fatigue and shortness of breath as well as tightness in her chest and shoulders. She was not able to lie down without feeling uncomfortable. When ultimately assessed in the ER she was found to be in rapid atrial fibrillation, placed on intravenous Cardizem and heparin, and later on in the evening spontaneously converted to sinus rhythm under observation on the hospitalist service.  Her CHADSVASC score is 2-3. She does not report any prior documented cardiac arrhythmias but states she has had a long-standing history of intermittent very brief palpitations.  She has a history of uterine fibroids status post myomectomy, does not report report any recurrent vaginal bleeding. She does report occasional hematochezia when she is constipated or strains. No other major bleeding problems.  Past Medical History:  Diagnosis Date  . Anemia   . Fibroids   . Hyperlipidemia   . Hypertension     Past Surgical History:  Procedure Laterality Date  . BREAST LUMPECTOMY Left   . MYOMECTOMY       Inpatient Medications: Scheduled Meds: . fenofibrate  54 mg Oral QPM   Continuous Infusions: . sodium chloride 100 mL/hr at 04/07/17 1058  . diltiazem (CARDIZEM) infusion 5 mg/hr (04/08/17 0100)  . heparin 1,200 Units/hr (04/07/17 1856)   PRN Meds: acetaminophen  **OR** acetaminophen, ondansetron **OR** ondansetron (ZOFRAN) IV  Allergies:    Allergies  Allergen Reactions  . Pravastatin     REACTION: Muscle aches    Social History:   Social History   Socioeconomic History  . Marital status: Married    Spouse name: Not on file  . Number of children: Not on file  . Years of education: Not on file  . Highest education level: Not on file  Social Needs  . Financial resource strain: Not on file  . Food insecurity - worry: Not on file  . Food insecurity - inability: Not on file  . Transportation needs - medical: Not on file  . Transportation needs - non-medical: Not on file  Occupational History  . Not on file  Tobacco Use  . Smoking status: Never Smoker  . Smokeless tobacco: Never Used  Substance and Sexual Activity  . Alcohol use: No  . Drug use: No  . Sexual activity: Not on file  Other Topics Concern  . Not on file  Social History Narrative  . Not on file    Family History:   The patient's family history includes Hyperlipidemia in her mother; Hypertension in her sister; Thyroid disease in her father.  ROS:  Please see the history of present illness.  All other ROS reviewed and negative.     Physical Exam/Data:   Vitals:   04/07/17 2300 04/08/17 0000 04/08/17 0100 04/08/17 0400  BP: 119/86 (!) 106/93 130/87   Pulse: 78 82 79   Resp: 15 (!) 22 14   Temp:  97.8 F (36.6  C)  98 F (36.7 C)  TempSrc:  Oral  Oral  SpO2: 98% 97% 99%   Weight:    170 lb 6.7 oz (77.3 kg)  Height:        Intake/Output Summary (Last 24 hours) at 04/08/2017 0928 Last data filed at 04/08/2017 0100 Gross per 24 hour  Intake 672.42 ml  Output 300 ml  Net 372.42 ml   Filed Weights   04/07/17 1659 04/08/17 0400  Weight: 168 lb 14 oz (76.6 kg) 170 lb 6.7 oz (77.3 kg)   Body mass index is 28.36 kg/m.   Gen: Patient appears comfortable at rest. HEENT: Conjunctiva and lids normal, oropharynx clear. Neck: Supple, no elevated JVP or carotid  bruits, no thyromegaly. Lungs: Clear to auscultation, nonlabored breathing at rest. Cardiac: Regular rate and rhythm, no S3 or significant systolic murmur, no pericardial rub. Abdomen: Soft, nontender, bowel sounds present, no guarding or rebound. Extremities: No pitting edema, distal pulses 2+. Skin: Warm and dry. Musculoskeletal: No kyphosis. Neuropsychiatric: Alert and oriented x3, affect grossly appropriate.  EKG:  I personally reviewed the tracing from 04/07/2017 which was incompletely scanned into EPIC and essentially shows a rhythm strip documenting atrial fibrillation.  Telemetry:  I personally reviewed telemetry which shows normal sinus rhythm.  Relevant CV Studies:  None prior.  Laboratory Data:  Chemistry Recent Labs  Lab 04/03/17 0830 04/07/17 1020 04/08/17 0457  NA 139 140 142  K 4.2 4.0 4.1  CL 104 103 108  CO2 27 25 25   GLUCOSE 92 133* 105*  BUN 17 15 15   CREATININE 0.86 0.87 0.94  CALCIUM 10.3 10.2 9.6  GFRNONAA 75 >60 >60  GFRAA 87 >60 >60  ANIONGAP  --  12 9    Recent Labs  Lab 04/03/17 0830  PROT 7.6  AST 22  ALT 28  BILITOT 0.3   Hematology Recent Labs  Lab 04/03/17 0830 04/07/17 1020 04/08/17 0457  WBC 6.3 8.2 8.0  RBC 5.34* 5.84* 5.24*  HGB 12.0 12.7 11.5*  HCT 37.2 40.4 35.8*  MCV 69.7* 69.2* 68.3*  MCH 22.5* 21.7* 21.9*  MCHC 32.3 31.4 32.1  RDW 15.2* 14.9 14.7  PLT 352 335 300   Cardiac EnzymesNo results for input(s): TROPONINI in the last 168 hours.  Recent Labs  Lab 04/07/17 1027  TROPIPOC 0.00     Radiology/Studies:  Dg Chest Portable 1 View  Result Date: 04/07/2017 CLINICAL DATA:  Acute chest pain and shortness of breath for 1 day. EXAM: PORTABLE CHEST 1 VIEW COMPARISON:  02/18/2016 and prior radiographs FINDINGS: The cardiomediastinal silhouette is unremarkable. Pulmonary vascular congestion noted. There is no evidence of focal airspace disease, pulmonary edema, suspicious pulmonary nodule/mass, pleural effusion, or  pneumothorax. No acute bony abnormalities are identified. IMPRESSION: Pulmonary vascular congestion. Electronically Signed   By: Margarette Canada M.D.   On: 04/07/2017 10:52    Assessment and Plan:   1. Newly documented atrial fibrillation presenting with RVR. Patient reports a long-standing history of very brief palpitations but no previously documented arrhythmias. CHADSVASC score is 2-3. She spontaneously converted to sinus rhythm with heart rate control on IV Cardizem.  2. Essential hypertension, on Norvasc as an outpatient.  3. Hyperlipidemia, on fenofibrate as an outpatient.  4. Relative glucose intolerance, mildly elevated hemoglobin A1c in the 5.8 6.0 range over the years.  Discussed atrial fibrillation with the patient including natural history and stroke risk, general management options with medical therapy. Plan at this time is to stop IV Cardizem and initiate  Cardizem CD and 120 mg daily. Would not resume Norvasc for treatment of hypertension. Echocardiogram will be obtained today to assess cardiac structure and function. Unless there are significant abnormalities that require further invasive workup, we will plan to switch from heparin to Eliquis for stroke prophylaxis. Would transfer her to telemetry for further observation.   Signed, Rozann Lesches, MD  04/08/2017 9:28 AM

## 2017-04-08 NOTE — Progress Notes (Signed)
Received on shift report that patient has been in NSR since 7:15pm last night, confirmed with 12 lead EKG. Cardizem gtt decreased to 2.5mg /hr on night shift and patient remains on Heparin gtt at this time. Patient remains in NSR at this time as well.

## 2017-04-08 NOTE — Progress Notes (Signed)
PROGRESS NOTE    Cindy Weiss  SAY:[id number] DOB: [birth year] DOA: 04/07/2017 PCP: Fayrene Helper, MD     Brief Narrative:  57 year old woman admitted from home on 1/9 with palpitations and chest fluttering.  She was found to be in A. fib with RVR.  Was admitted to the ICU on a heparin as well as Cardizem drip.  Overnight has transition over to sinus rhythm, has been seen in consultation by cardiology with plans to transition over to oral Cardizem as well as Eliquis.   Assessment & Plan:   Active Problems:   Hyperlipidemia   Atrial fibrillation with RVR (HCC)   Essential hypertension   Impaired glucose tolerance   New onset A. fib with RVR -She has now converted over to sinus rhythm with rates in the 80s. -Plan to transition over to oral Cardizem CD today. -We will also transition heparin over to Eliquis. -Echo is currently pending. -CHADSVASC score is at least 2 given her gender and hypertension, may be 3 given her history of impaired glucose tolerance although she is not a frank diabetic.  Also need to await echo results to see if any evidence for CHF. -In any case she does qualify for anticoagulation. -Transfer to telemetry and observe another 24 hours, anticipate discharge home in a.m.  Essential hypertension -Now on Cardizem CD, no plans to reinitiate amlodipine. -Well-controlled  Impaired glucose tolerance -Recent A1c was 5.8. -Glucose on BMPs has been 105-133.   DVT prophylaxis: IV heparin, to be transitioned over to Eliquis Code Status: Full code Family Communication: Patient only Disposition Plan: Transfer to telemetry, anticipate discharge home in 24 hours  Consultants:   Cardiology  Procedures:   2D echo, pending  Antimicrobials:  Anti-infectives (From admission, onward)   None       Subjective: Feels much better, denies chest pain, palpitations and shortness of breath.  Objective: Vitals:   04/07/17 2300 04/08/17 0000 04/08/17 0100  04/08/17 0400  BP: 119/86 (!) 106/93 130/87   Pulse: 78 82 79   Resp: 15 (!) 22 14   Temp:  97.8 F (36.6 C)  98 F (36.7 C)  TempSrc:  Oral  Oral  SpO2: 98% 97% 99%   Weight:    77.3 kg (170 lb 6.7 oz)  Height:        Intake/Output Summary (Last 24 hours) at 04/08/2017 1047 Last data filed at 04/08/2017 0100 Gross per 24 hour  Intake 672.42 ml  Output 300 ml  Net 372.42 ml   Filed Weights   04/07/17 1659 04/08/17 0400  Weight: 76.6 kg (168 lb 14 oz) 77.3 kg (170 lb 6.7 oz)    Examination:  General exam: Alert, awake, oriented x 3 Respiratory system: Clear to auscultation. Respiratory effort normal. Cardiovascular system:RRR. No murmurs, rubs, gallops. Gastrointestinal system: Abdomen is nondistended, soft and nontender. No organomegaly or masses felt. Normal bowel sounds heard. Central nervous system: Alert and oriented. No focal neurological deficits. Extremities: No C/C/E, +pedal pulses Skin: No rashes, lesions or ulcers Psychiatry: Judgement and insight appear normal. Mood & affect appropriate.     Data Reviewed: I have personally reviewed following labs and imaging studies  CBC: Recent Labs  Lab 04/03/17 0830 04/07/17 1020 04/08/17 0457  WBC 6.3 8.2 8.0  NEUTROABS  --  5.7  --   HGB 12.0 12.7 11.5*  HCT 37.2 40.4 35.8*  MCV 69.7* 69.2* 68.3*  PLT 352 335 235   Basic Metabolic Panel: Recent Labs  Lab 04/03/17 0830 04/07/17  1020 04/08/17 0457  NA 139 140 142  K 4.2 4.0 4.1  CL 104 103 108  CO2 27 25 25   GLUCOSE 92 133* 105*  BUN 17 15 15   CREATININE 0.86 0.87 0.94  CALCIUM 10.3 10.2 9.6   GFR: Estimated Creatinine Clearance: 67.9 mL/min (by C-G formula based on SCr of 0.94 mg/dL). Liver Function Tests: Recent Labs  Lab 04/03/17 0830  AST 22  ALT 28  BILITOT 0.3  PROT 7.6   No results for input(s): LIPASE, AMYLASE in the last 168 hours. No results for input(s): AMMONIA in the last 168 hours. Coagulation Profile: No results for  input(s): INR, PROTIME in the last 168 hours. Cardiac Enzymes: No results for input(s): CKTOTAL, CKMB, CKMBINDEX, TROPONINI in the last 168 hours. BNP (last 3 results) No results for input(s): PROBNP in the last 8760 hours. HbA1C: No results for input(s): HGBA1C in the last 72 hours. CBG: No results for input(s): GLUCAP in the last 168 hours. Lipid Profile: No results for input(s): CHOL, HDL, LDLCALC, TRIG, CHOLHDL, LDLDIRECT in the last 72 hours. Thyroid Function Tests: No results for input(s): TSH, T4TOTAL, FREET4, T3FREE, THYROIDAB in the last 72 hours. Anemia Panel: No results for input(s): VITAMINB12, FOLATE, FERRITIN, TIBC, IRON, RETICCTPCT in the last 72 hours. Urine analysis: No results found for: COLORURINE, APPEARANCEUR, LABSPEC, PHURINE, GLUCOSEU, HGBUR, BILIRUBINUR, KETONESUR, PROTEINUR, UROBILINOGEN, NITRITE, LEUKOCYTESUR Sepsis Labs: @LABRCNTIP (procalcitonin:4,lacticidven:4)  ) Recent Results (from the past 240 hour(s))  MRSA PCR Screening     Status: None   Collection Time: 04/07/17  4:59 PM  Result Value Ref Range Status   MRSA by PCR NEGATIVE NEGATIVE Final    Comment:        The GeneXpert MRSA Assay (FDA approved for NASAL specimens only), is one component of a comprehensive MRSA colonization surveillance program. It is not intended to diagnose MRSA infection nor to guide or monitor treatment for MRSA infections.          Radiology Studies: Dg Chest Portable 1 View  Result Date: 04/07/2017 CLINICAL DATA:  Acute chest pain and shortness of breath for 1 day. EXAM: PORTABLE CHEST 1 VIEW COMPARISON:  02/18/2016 and prior radiographs FINDINGS: The cardiomediastinal silhouette is unremarkable. Pulmonary vascular congestion noted. There is no evidence of focal airspace disease, pulmonary edema, suspicious pulmonary nodule/mass, pleural effusion, or pneumothorax. No acute bony abnormalities are identified. IMPRESSION: Pulmonary vascular congestion.  Electronically Signed   By: Margarette Canada M.D.   On: 04/07/2017 10:52        Scheduled Meds: . diltiazem  120 mg Oral Daily  . fenofibrate  54 mg Oral QPM   Continuous Infusions: . sodium chloride 100 mL/hr at 04/07/17 1058     LOS: 0 days    Time spent: 30 minutes. Greater than 50% of this time was spent in direct contact with the patient coordinating care.     Lelon Frohlich, MD [facility] Pager [phone]  If 7PM-7AM, please contact night-coverage [email or web address] Password [email or web address] 04/08/2017, 10:47 AM

## 2017-04-08 NOTE — Progress Notes (Signed)
*  PRELIMINARY RESULTS* Echocardiogram 2D Echocardiogram has been performed.  Leavy Cella 04/08/2017, 10:04 AM

## 2017-04-08 NOTE — Progress Notes (Signed)
[place] for HEPARIN Indication: atrial fibrillation  Allergies  Allergen Reactions  . Pravastatin     REACTION: Muscle aches   Patient Measurements: Height: 5\' 5"  (165.1 cm) Weight: 170 lb 6.7 oz (77.3 kg) IBW/kg (Calculated) : 57 HEPARIN DW (KG): 72.9  Vital Signs: Temp: 98 F (36.7 C) (01/10 0400) Temp Source: Oral (01/10 0400) BP: 130/87 (01/10 0100) Pulse Rate: 79 (01/10 0100)  Labs: Recent Labs    04/07/17 1020 04/08/17 0457  HGB 12.7 11.5*  HCT 40.4 35.8*  PLT 335 300  HEPARINUNFRC  --  0.49  CREATININE 0.87 0.94   Estimated Creatinine Clearance: 67.9 mL/min (by C-G formula based on SCr of 0.94 mg/dL).  Medical History: Past Medical History:  Diagnosis Date  . Anemia   . Fibroid tumor   . Fibroids   . High cholesterol   . Hypertension    Medications:  Medications Prior to Admission  Medication Sig Dispense Refill Last Dose  . amLODipine (NORVASC) 2.5 MG tablet Take 1 tablet (2.5 mg total) by mouth daily. 30 tablet 1 04/07/2017 at Unknown time  . calcium carbonate (OS-CAL - DOSED IN MG OF ELEMENTAL CALCIUM) 1250 (500 Ca) MG tablet Take 2 tablets by mouth daily with breakfast.   Past Week at Unknown time  . fenofibrate 54 MG tablet Take 1 tablet (54 mg total) by mouth daily. (Patient taking differently: Take 54 mg by mouth every evening. ) 30 tablet 1 04/06/2017 at Unknown time  . Multiple Vitamins-Minerals (ALIVE WOMENS 50+ PO) Take 1 tablet by mouth daily.   Past Week at Unknown time   Assessment: 57yo female with c/o heart fluttering. Pt was noted to have afib with RVR.  Pharmacy asked to initiate Heparin protocol.  Heparin level is therapeutic.   Goal of Therapy:  Heparin level 0.3-0.7 units/ml Monitor platelets by anticoagulation protocol: Yes   Plan:   Continue Heparin infusion at 1200 units/hr  Heparin level daily  CBC daily while on Heparin   Cindy Weiss A 04/08/2017,7:36 AM

## 2017-04-08 NOTE — Progress Notes (Signed)
Patient now telemetry and transferred to Dept 300 room# 320. Report on patient given to receiving nurse Lilia Pro, RN.

## 2017-04-08 NOTE — Care Management Note (Signed)
Case Management Note  Patient Details  Name: SHARONICA KRASZEWSKI MRN: [id number] Date of Birth: July 20, 1960  Subjective/Objective:   Adm with new onset of Afib with RVR. From home with husband. Ind with ADLs, works. She does not currently have insurance but states that she will have insurance through her husband starting February 5th. Patient will be new to Eliquis. We discuss cost. Patient given 30 day free voucher with explanation of how to use. She should have coverage in less than 30 days. Husband at work, does not have a cell phone for CM to verify.                  Action/Plan: Will talk with husband at bedside in the morning to confirm patient will be covered by insurance.    Expected Discharge Date:    04/09/2017              Expected Discharge Plan:  Home/Self Care  In-House Referral:     Discharge planning Services  CM Consult, Medication Assistance  Post Acute Care Choice:    Choice offered to:  Patient  DME Arranged:    DME Agency:     HH Arranged:    [place] Agency:     Status of Service:  In process, will continue to follow  If discussed at Long Length of Stay Meetings, dates discussed:    Additional Comments:  Jahan Friedlander, Chauncey Reading, RN 04/08/2017, 3:32 PM

## 2017-04-08 NOTE — Discharge Instructions (Signed)

## 2017-04-08 NOTE — Progress Notes (Signed)
ANTICOAGULATION CONSULT NOTE - Initial Consult  Pharmacy Consult for Apixaban Indication: atrial fibrillation  Allergies  Allergen Reactions  . Pravastatin     REACTION: Muscle aches   Patient Measurements: Height: 5\' 5"  (165.1 cm) Weight: 170 lb 6.7 oz (77.3 kg) IBW/kg (Calculated) : 57  Vital Signs: Temp: 98 F (36.7 C) (01/10 0400) Temp Source: Oral (01/10 0400) BP: 130/87 (01/10 0100) Pulse Rate: 79 (01/10 0100)  Labs: Recent Labs    04/07/17 1020 04/08/17 0457  HGB 12.7 11.5*  HCT 40.4 35.8*  PLT 335 300  HEPARINUNFRC  --  0.49  CREATININE 0.87 0.94   Estimated Creatinine Clearance: 67.9 mL/min (by C-G formula based on SCr of 0.94 mg/dL).  Medical History: Past Medical History:  Diagnosis Date  . Anemia   . Fibroids   . Hyperlipidemia   . Hypertension    Medications:  Medications Prior to Admission  Medication Sig Dispense Refill Last Dose  . amLODipine (NORVASC) 2.5 MG tablet Take 1 tablet (2.5 mg total) by mouth daily. 30 tablet 1 04/07/2017 at Unknown time  . calcium carbonate (OS-CAL - DOSED IN MG OF ELEMENTAL CALCIUM) 1250 (500 Ca) MG tablet Take 2 tablets by mouth daily with breakfast.   Past Week at Unknown time  . fenofibrate 54 MG tablet Take 1 tablet (54 mg total) by mouth daily. (Patient taking differently: Take 54 mg by mouth every evening. ) 30 tablet 1 04/06/2017 at Unknown time  . Multiple Vitamins-Minerals (ALIVE WOMENS 50+ PO) Take 1 tablet by mouth daily.   Past Week at Unknown time   Assessment: 57yo female with new onset afib.  Pt has been on Heparin.  Pharmacy asked to transition to Apixaban.   Goal of Therapy:  Stroke prevention Monitor platelets by anticoagulation protocol: Yes   Plan:  Apixaban 5mg  po BID Provide education Monitor for s/sx bleeding complications  Hart Robinsons A 04/08/2017,11:07 AM

## 2017-04-09 DIAGNOSIS — I480 Paroxysmal atrial fibrillation: Secondary | ICD-10-CM

## 2017-04-09 DIAGNOSIS — E7801 Familial hypercholesterolemia: Secondary | ICD-10-CM

## 2017-04-09 LAB — HEPARIN LEVEL (UNFRACTIONATED): Heparin Unfractionated: 2.14 IU/mL — ABNORMAL HIGH (ref 0.30–0.70)

## 2017-04-09 LAB — BASIC METABOLIC PANEL
ANION GAP: 8 (ref 5–15)
BUN: 18 mg/dL (ref 6–20)
CO2: 24 mmol/L (ref 22–32)
Calcium: 9.3 mg/dL (ref 8.9–10.3)
Chloride: 108 mmol/L (ref 101–111)
Creatinine, Ser: 0.81 mg/dL (ref 0.44–1.00)
Glucose, Bld: 109 mg/dL — ABNORMAL HIGH (ref 65–99)
Potassium: 4 mmol/L (ref 3.5–5.1)
SODIUM: 140 mmol/L (ref 135–145)

## 2017-04-09 LAB — CBC
HCT: 35.6 % — ABNORMAL LOW (ref 36.0–46.0)
Hemoglobin: 11.4 g/dL — ABNORMAL LOW (ref 12.0–15.0)
MCH: 21.8 pg — ABNORMAL LOW (ref 26.0–34.0)
MCHC: 32 g/dL (ref 30.0–36.0)
MCV: 68.2 fL — ABNORMAL LOW (ref 78.0–100.0)
PLATELETS: 300 10*3/uL (ref 150–400)
RBC: 5.22 MIL/uL — AB (ref 3.87–5.11)
RDW: 14.5 % (ref 11.5–15.5)
WBC: 6.2 10*3/uL (ref 4.0–10.5)

## 2017-04-09 MED ORDER — APIXABAN 5 MG PO TABS: 5.0000 mg | ORAL_TABLET | Freq: Two times a day (BID) | ORAL | 2 refills | Status: DC

## 2017-04-09 MED ORDER — DILTIAZEM HCL ER COATED BEADS 120 MG PO CP24: 120.0000 mg | ORAL_CAPSULE | Freq: Every day | ORAL | 2 refills | Status: DC

## 2017-04-09 NOTE — Progress Notes (Signed)
Discharge instructions gone over with patient, verbalized understanding. Printed prescription given to patient. IV's removed, patient tolerated procedure well.

## 2017-04-09 NOTE — Discharge Summary (Signed)
Physician Discharge Summary  Cindy Weiss BJY:[id number] DOB: 06/19/1960 DOA: 04/07/2017  PCP: Fayrene Helper, MD  Admit date: 04/07/2017 Discharge date: 04/09/2017  Time spent: 45 minutes  Recommendations for Outpatient Follow-up:  -Will be discharged home today. -Follow-up with cardiology will be arranged by their office.  Discharge Diagnoses:  Active Problems:   Hyperlipidemia   Atrial fibrillation with RVR (HCC)   Essential hypertension   Impaired glucose tolerance   Discharge Condition: Stable and improved  Filed Weights   04/07/17 1659 04/08/17 0400  Weight: 76.6 kg (168 lb 14 oz) 77.3 kg (170 lb 6.7 oz)    History of present illness:  As per Dr. Carles Collet on 1/9: Cindy Weiss is a 57 y.o. female with medical history of hypertension presenting with 3-day history of heart fluttering sensation.  She states that it has been intermittent up until the morning of 04/07/2017.  The patient was getting ready for work when her fluttering sensation became more constant associated with some shortness of breath, chest tightness, and "choking sensation".  The patient had a near syncopal episode when trying to go to the bathroom.  She felt better when she laid down, but, but her shortness of breath and fluttering sensation got worse when she got up to exert herself again.  As result, the patient presented to ED for further evaluation.  The patient denies any recent fever, chills, headache, neck pain, nausea, vomiting, diarrhea, abdominal pain.  She denies any new medications.  She has had some intermittent hematochezia in the recent past which she attributed to some hemorrhoids due to constipation.  She states that over 30 years ago she had to wear some type of heart monitor, but she does not recall anything became of it.  In the emergency department, the patient was noted to have atrial fibrillation with RVR with heart rate in the 150s.  The patient was hemodynamically stable saturating  100% on room air.  BMP and CBC were unremarkable.  Chest x-ray showed pulmonary vascular congestion without pulmonary edema.  The patient was given a diltiazem bolus and started on diltiazem drip.  Initial troponin was unremarkable.    Hospital Course:   New onset A. fib with RVR -She has now converted over to sinus rhythm with rates in the 80s. -Has been transitioned over to Cardizem CD, rates remain stable. -We will also transition heparin over to Eliquis. -Echo with preserved ejection fraction, see below for details. -CHADSVASC score is at least 2 given her gender and hypertension, may be 3 given her history of impaired glucose tolerance although she is not a frank diabetic.  -In any case she does qualify for anticoagulation.  Essential hypertension -Now on Cardizem CD, no plans to reinitiate amlodipine. -Well-controlled  Impaired glucose tolerance -Recent A1c was 5.8. -Glucose on BMPs has been 105-133.    Procedures:  2D echo: Ejection fraction of 60-65% with normal diastolic function.     Consultations:  Cardiology  Discharge Instructions  Discharge Instructions    Diet - low sodium heart healthy   Complete by:  As directed    Increase activity slowly   Complete by:  As directed      Allergies as of 04/09/2017      Reactions   Pravastatin    REACTION: Muscle aches      Medication List    STOP taking these medications   amLODipine 2.5 MG tablet Commonly known as:  NORVASC     TAKE these medications  ALIVE WOMENS 50+ PO Take 1 tablet by mouth daily.   apixaban 5 MG Tabs tablet Commonly known as:  ELIQUIS Take 1 tablet (5 mg total) by mouth 2 (two) times daily.   calcium carbonate 1250 (500 Ca) MG tablet Commonly known as:  OS-CAL - dosed in mg of elemental calcium Take 2 tablets by mouth daily with breakfast.   diltiazem 120 MG 24 hr capsule Commonly known as:  CARDIZEM CD Take 1 capsule (120 mg total) by mouth daily. Start taking on:   04/10/2017   fenofibrate 54 MG tablet Take 1 tablet (54 mg total) by mouth daily. What changed:  when to take this      Allergies  Allergen Reactions  . Pravastatin     REACTION: Muscle aches   Follow-up Information    Erma Heritage, PA-C Follow up on 05/06/2017.   Specialties:  Physician Assistant, Cardiology Why:  Cardiology Hospital Follow-Up on 05/06/2017 at 3:30PM.  Contact information: [address] [place] [address] [phone]        Fayrene Helper, MD. Go in 2 week(s).   Specialty:  Family Medicine Why:  Wednesday, April 21, 2017 at 2:00 PM Contact information: [address] [place] [address] [phone]            The results of significant diagnostics from this hospitalization (including imaging, microbiology, ancillary and laboratory) are listed below for reference.    Significant Diagnostic Studies: Dg Chest Portable 1 View  Result Date: 04/07/2017 CLINICAL DATA:  Acute chest pain and shortness of breath for 1 day. EXAM: PORTABLE CHEST 1 VIEW COMPARISON:  02/18/2016 and prior radiographs FINDINGS: The cardiomediastinal silhouette is unremarkable. Pulmonary vascular congestion noted. There is no evidence of focal airspace disease, pulmonary edema, suspicious pulmonary nodule/mass, pleural effusion, or pneumothorax. No acute bony abnormalities are identified. IMPRESSION: Pulmonary vascular congestion. Electronically Signed   By: Margarette Canada M.D.   On: 04/07/2017 10:52    Microbiology: Recent Results (from the past 240 hour(s))  MRSA PCR Screening     Status: None   Collection Time: 04/07/17  4:59 PM  Result Value Ref Range Status   MRSA by PCR NEGATIVE NEGATIVE Final    Comment:        The GeneXpert MRSA Assay (FDA approved for NASAL specimens only), is one component of a comprehensive MRSA colonization surveillance program. It is not intended to diagnose MRSA infection nor to guide or monitor treatment for MRSA  infections.      Labs: Basic Metabolic Panel: Recent Labs  Lab 04/03/17 0830 04/07/17 1020 04/08/17 0457 04/09/17 0530  NA 139 140 142 140  K 4.2 4.0 4.1 4.0  CL 104 103 108 108  CO2 27 25 25 24   GLUCOSE 92 133* 105* 109*  BUN 17 15 15 18   CREATININE 0.86 0.87 0.94 0.81  CALCIUM 10.3 10.2 9.6 9.3   Liver Function Tests: Recent Labs  Lab 04/03/17 0830  AST 22  ALT 28  BILITOT 0.3  PROT 7.6   No results for input(s): LIPASE, AMYLASE in the last 168 hours. No results for input(s): AMMONIA in the last 168 hours. CBC: Recent Labs  Lab 04/03/17 0830 04/07/17 1020 04/08/17 0457 04/09/17 0530  WBC 6.3 8.2 8.0 6.2  NEUTROABS  --  5.7  --   --   HGB 12.0 12.7 11.5* 11.4*  HCT 37.2 40.4 35.8* 35.6*  MCV 69.7* 69.2* 68.3* 68.2*  PLT 352 335 300 300  Cardiac Enzymes: No results for input(s): CKTOTAL, CKMB, CKMBINDEX, TROPONINI in the last 168 hours. BNP: BNP (last 3 results) No results for input(s): BNP in the last 8760 hours.  ProBNP (last 3 results) No results for input(s): PROBNP in the last 8760 hours.  CBG: No results for input(s): GLUCAP in the last 168 hours.     Signed:  Lelon Frohlich  [facility] Pager: [phone] 04/09/2017, 5:11 PM

## 2017-04-09 NOTE — Progress Notes (Signed)
Second dose of Eliquis given to patient before discharge because patient was unable to get to [place] before they closed to get her medicine. For the patient's benefit I discussed with the MD about giving her her night time dose early so that we could ensure she got it for the second time today, and the MD agreed to go ahead and give the dose now.

## 2017-04-09 NOTE — Care Management Note (Signed)
Case Management Note  Patient Details  Name: MAE DENUNZIO MRN: [id number] Date of Birth: [birth year]  Expected Discharge Date:     04/09/2017             Expected Discharge Plan:  Home/Self Care   Discharge planning Services  CM Consult, Medication Assistance  Status of Service:  Completed, signed off  If discussed at [place] of Stay Meetings, dates discussed:    Additional Comments: Pt discharging home today with self care. Husband verified pt will have insurance next month. Pt given application for financial assistance for eliquis to complete as a back up plan in case insurance falls through. Pt told to get Rx filled at [facility] as they have starter packs available. Pt has 30-day voucher for first Rx of medications.   Sherald Barge, RN 04/09/2017, 12:37 PM

## 2017-04-09 NOTE — Progress Notes (Signed)
Progress Note  Patient Name: Cindy Weiss Date of Encounter: 04/09/2017  Primary Cardiologist: Dr. Satira Sark  Subjective   Denies any recurrent palpitations, chest pain, or dyspnea. Walking around the room without difficulty.   Inpatient Medications    Scheduled Meds: . apixaban  5 mg Oral BID  . diltiazem  120 mg Oral Daily  . fenofibrate  54 mg Oral QPM   Continuous Infusions: . sodium chloride 100 mL/hr at 04/09/17 0404   PRN Meds: acetaminophen **OR** acetaminophen, ondansetron **OR** ondansetron (ZOFRAN) IV   Vital Signs    Vitals:   04/08/17 1133 04/08/17 1611 04/08/17 2238 04/09/17 0511  BP: (!) 134/91  111/63 123/79  Pulse:  80 80 73  Resp:  19 14 20   Temp:  98 F (36.7 C) 98 F (36.7 C) 98.2 F (36.8 C)  TempSrc:  Oral Oral Oral  SpO2:  99% 98% 98%  Weight:      Height:        Intake/Output Summary (Last 24 hours) at 04/09/2017 0921 Last data filed at 04/09/2017 0800 Gross per 24 hour  Intake 3520 ml  Output -  Net 3520 ml   Filed Weights   04/07/17 1659 04/08/17 0400  Weight: 168 lb 14 oz (76.6 kg) 170 lb 6.7 oz (77.3 kg)    Telemetry    NSR, HR in 70's to 80's.  - Personally Reviewed  ECG    No new tracings.   Physical Exam   General: Well developed, well nourished African American female appearing in no acute distress. Head: Normocephalic, atraumatic.  Neck: Supple without bruits, JVD not elevated. Lungs:  Resp regular and unlabored, CTA without wheezing or rales. Heart: RRR, S1, S2, no S3, S4, or murmur; no rub. Abdomen: Soft, non-tender, non-distended with normoactive bowel sounds. No hepatomegaly. No rebound/guarding. No obvious abdominal masses. Extremities: No clubbing, cyanosis, or lower extremity edema. Distal pedal pulses are 2+ bilaterally. Neuro: Alert and oriented X 3. Moves all extremities spontaneously. Psych: Normal affect.  Labs    Chemistry Recent Labs  Lab 04/03/17 0830 04/07/17 1020  04/08/17 0457 04/09/17 0530  NA 139 140 142 140  K 4.2 4.0 4.1 4.0  CL 104 103 108 108  CO2 27 25 25 24   GLUCOSE 92 133* 105* 109*  BUN 17 15 15 18   CREATININE 0.86 0.87 0.94 0.81  CALCIUM 10.3 10.2 9.6 9.3  PROT 7.6  --   --   --   AST 22  --   --   --   ALT 28  --   --   --   BILITOT 0.3  --   --   --   GFRNONAA 75 >60 >60 >60  GFRAA 87 >60 >60 >60  ANIONGAP  --  12 9 8      Hematology Recent Labs  Lab 04/07/17 1020 04/08/17 0457 04/09/17 0530  WBC 8.2 8.0 6.2  RBC 5.84* 5.24* 5.22*  HGB 12.7 11.5* 11.4*  HCT 40.4 35.8* 35.6*  MCV 69.2* 68.3* 68.2*  MCH 21.7* 21.9* 21.8*  MCHC 31.4 32.1 32.0  RDW 14.9 14.7 14.5  PLT 335 300 300    Cardiac EnzymesNo results for input(s): TROPONINI in the last 168 hours.  Recent Labs  Lab 04/07/17 1027  TROPIPOC 0.00     BNPNo results for input(s): BNP, PROBNP in the last 168 hours.   DDimer No results for input(s): DDIMER in the last 168 hours.   Radiology    Dg  Chest Portable 1 View  Result Date: 04/07/2017 CLINICAL DATA:  Acute chest pain and shortness of breath for 1 day. EXAM: PORTABLE CHEST 1 VIEW COMPARISON:  02/18/2016 and prior radiographs FINDINGS: The cardiomediastinal silhouette is unremarkable. Pulmonary vascular congestion noted. There is no evidence of focal airspace disease, pulmonary edema, suspicious pulmonary nodule/mass, pleural effusion, or pneumothorax. No acute bony abnormalities are identified. IMPRESSION: Pulmonary vascular congestion. Electronically Signed   By: Margarette Canada M.D.   On: 04/07/2017 10:52    Cardiac Studies   Echocardiogram: 04/08/2017 Study Conclusions  - Left ventricle: The cavity size was normal. Wall thickness was   normal. Systolic function was normal. The estimated ejection   fraction was in the range of 60% to 65%. Wall motion was normal;   there were no regional wall motion abnormalities. Left   ventricular diastolic function parameters were normal. - Mitral valve: There was  mild regurgitation. - Right atrium: Central venous pressure (est): 3 mm Hg. - Atrial septum: No defect or patent foramen ovale was identified. - Tricuspid valve: There was trivial regurgitation. - Pulmonary arteries: Systolic pressure could not be accurately   estimated. - Pericardium, extracardiac: There was no pericardial effusion.  Impressions: - Normal LV wall thickness with LVEF 60-65% and grossly normal   diastolic function. Mild mitral regurgitation. Trivial tricuspid   regurgitation.  Patient Profile     57 y.o. female w/ PMH of HTN and HLD who presented to [facility] ED on 04/07/2017 for evaluation of new-onset dyspnea and chest pain, found to be in new-onset atrial fibrillation with RVR. Started on IV Cardizem and Heparin with spontaneous conversion to NSR.   Assessment & Plan    1. New-onset Atrial Fibrillation with RVR - presented with worsening palpitations throughout the day and found to be in atrial fibrillation with RVR. Started on IV Cardizem with spontaneous conversion to NSR later that day. Has been switched from IV Cardizem to Cardizem CD 120mg  daily.  - electrolytes have been WNL. TSH at 0.86 on 04/03/2017. Echo shows a preserved EF of 60-65% with no regional WMA and mild MR.  - This patients CHA2DS2-VASc Score and unadjusted Ischemic Stroke Rate (% per year) is equal to 2.2 % stroke rate/year from a score of 2 (Female, HTN). She has been started on Eliquis 5mg  BID. She has been switched to Eliquis 5mg  BID for anticoagulation. Appreciate Case Management's assistance with insurance verification.   2. HTN - BP has been well-controlled at 111/63 - 134/91 within the past 24 hours.  - continue Cardizem CD 120mg  daily. Do not restart on Amlodipine at discharge with concurrent Cardizem use. Can consider further titration of Cardizem or initiation of Losartan if additional BP control is needed.   Likely stable for discharge later today. Will arrange outpatient Cardiology  follow-up.   For questions or updates, please contact [place] Please consult [email or web address] for contact info under Cardiology/STEMI.   Linus Galas , PA-C 9:21 AM 04/09/2017 Pager: [phone]   Attending note:  Patient seen and examined. I reviewed interval hospital course and discussed the case with Ms. Ahmed Prima PA-C. Ms. Lorman remains in sinus rhythm having spontaneously converted with heart rate control. On examination this morning her lungs are clear and cardiac exam reveals RRR without gallop. Systolic blood pressures in the 120s with heart rate in the 70s. Her CHADSVASC score is 2 and she has been started on Eliquis for stroke prophylaxis. Echocardiogram performed yesterday showed normal LVEF with no major valvular abnormalities. Anticipate discharge  home today on Cardizem CD 120 mg daily. We will arrange an office visit in the next few weeks. Would not resume Norvasc.  Satira Sark, M.D., F.A.C.C.

## 2017-04-12 ENCOUNTER — Telehealth: Payer: Self-pay | Admitting: Family Medicine

## 2017-04-12 NOTE — Telephone Encounter (Signed)
Tried two more times to reach patient. Phone will ring one time then static, then hang up.

## 2017-04-12 NOTE — Telephone Encounter (Signed)
Called patient regarding message below. No answer, unable to leave message.  

## 2017-04-13 NOTE — Telephone Encounter (Signed)
Called patient regarding message below. No answer, left generic message for patient to return call.   

## 2017-04-21 ENCOUNTER — Telehealth: Payer: Self-pay

## 2017-04-21 ENCOUNTER — Ambulatory Visit: Payer: Self-pay | Admitting: Family Medicine

## 2017-04-21 NOTE — Telephone Encounter (Signed)
I spoke with her on the job, she is doing fairly well , has occasional "flutters"  Will schedule her for a physical from mid feb onward when she has her insurance

## 2017-04-21 NOTE — Telephone Encounter (Signed)
Patient called in and said that she is still uninsured and she cannot afford to come today. Husbands insurance doesn't kick in until Feb 6. Was upset thinking you were going to make her come in since she was in the hospital and I told her I would talk to you and call her back to let her know what you said about it. Please advise. States she cannot pay another out of pocket bill.  [phone]

## 2017-04-22 ENCOUNTER — Telehealth: Payer: Self-pay | Admitting: Family Medicine

## 2017-04-22 NOTE — Telephone Encounter (Signed)
Pt called in to advise that she lost her insurance, and will not pick insurance back up til Feb. Will call then for an appt.

## 2017-05-05 NOTE — Progress Notes (Signed)
Cardiology Office Note    Date:  05/06/2017   ID:  Cindy Weiss, DOB May 01, 1960, MRN [id number]  PCP:  Cindy Helper, MD  Cardiologist: Dr. Domenic Weiss   Chief Complaint  Patient presents with  . Hospitalization Follow-up    History of Present Illness:    Cindy Weiss is a 57 y.o. female with past medical history of HTN and HLD who presents to the office today for hospital follow-up.   She was recently admitted to [facility] from 1/9 - 04/09/2017 for evaluation of new-onset dyspnea and chest pain, found to be in atrial fibrillation with RVR. She was started on IV Cardizem and experienced spontaneous conversion to NSR. Electrolytes and TSH were WNL and her echocardiogram showed a preserved EF of 60-65% with mild MR. She was started on Eliquis 5mg  BID for anticoagulation and IV Cardizem was switched to PO Cardizem 120mg  daily.   In talking with the patient today, she reports overall doing well since her recent hospitalization. She denies any repeat episodes of dyspnea or palpitations. No recent exertional chest discomfort, dyspnea on exertion, orthopnea, PND, or lower extremity edema. Has experienced occasional episodes of a burning sensation along her sternum after consuming tomatoes or other acidic foods. Has been using ginger mints with resolution of her symptoms.   She reports good compliance with her medication regimen, including Eliquis and Cardizem. She denies any evidence of active bleeding.   Past Medical History:  Diagnosis Date  . Anemia   . Fibroids   . Hyperlipidemia   . Hypertension   . PAF (paroxysmal atrial fibrillation) ([place])    a. diagnosed in 03/2017. Spontaneous conversion to NSR and started on Eliquis for anticoagulation    Past Surgical History:  Procedure Laterality Date  . BREAST LUMPECTOMY Left   . MYOMECTOMY      Current Medications: Outpatient Medications Prior to Visit  Medication Sig Dispense Refill  . calcium carbonate (OS-CAL - DOSED IN  MG OF ELEMENTAL CALCIUM) 1250 (500 Ca) MG tablet Take 2 tablets by mouth daily with breakfast.    . fenofibrate 54 MG tablet Take 1 tablet (54 mg total) by mouth daily. (Patient taking differently: Take 54 mg by mouth every evening. ) 30 tablet 1  . Multiple Vitamins-Minerals (ALIVE WOMENS 50+ PO) Take 1 tablet by mouth daily.    Cindy Weiss apixaban (ELIQUIS) 5 MG TABS tablet Take 1 tablet (5 mg total) by mouth 2 (two) times daily. (Patient taking differently: Take 5 mg by mouth. ) 60 tablet 2  . diltiazem (CARDIZEM CD) 120 MG 24 hr capsule Take 1 capsule (120 mg total) by mouth daily. 30 capsule 2   No facility-administered medications prior to visit.      Allergies:   Pravastatin   Social History   Socioeconomic History  . Marital status: Married    Spouse name: None  . Number of children: None  . Years of education: None  . Highest education level: None  Social Needs  . Financial resource strain: None  . Food insecurity - worry: None  . Food insecurity - inability: None  . Transportation needs - medical: None  . Transportation needs - non-medical: None  Occupational History  . None  Tobacco Use  . Smoking status: Never Smoker  . Smokeless tobacco: Never Used  Substance and Sexual Activity  . Alcohol use: No  . Drug use: No  . Sexual activity: None  Other Topics Concern  . None  Social History Narrative  .  None     Family History:  The patient's family history includes Hyperlipidemia in her mother; Hypertension in her sister; Thyroid disease in her father.   Review of Systems:   Please see the history of present illness.     General:  No chills, fever, night sweats or weight changes.  Cardiovascular:  No chest pain, dyspnea on exertion, edema, orthopnea, paroxysmal nocturnal dyspnea. Positive for palpitations (now resolved).  Dermatological: No rash, lesions/masses Respiratory: No cough, dyspnea Urologic: No hematuria, dysuria Abdominal:   No nausea, vomiting, diarrhea,  bright red blood per rectum, melena, or hematemesis Neurologic:  No visual changes, wkns, changes in mental status.   All other systems reviewed and are otherwise negative except as noted above.   Physical Exam:    VS:  BP 136/86   Pulse 85   Ht 5\' 5"  (1.651 m)   Wt 174 lb (78.9 kg)   SpO2 97%   BMI 28.96 kg/m    General: Well developed, well nourished [place] American female appearing in no acute distress. Head: Normocephalic, atraumatic, sclera non-icteric, no xanthomas, nares are without discharge.  Neck: No carotid bruits. JVD not elevated.  Lungs: Respirations regular and unlabored, without wheezes or rales.  Heart: Regular rate and rhythm. No S3 or S4.  No murmur, no rubs, or gallops appreciated. Abdomen: Soft, non-tender, non-distended with normoactive bowel sounds. No hepatomegaly. No rebound/guarding. No obvious abdominal masses. Msk:  Strength and tone appear normal for age. No joint deformities or effusions. Extremities: No clubbing or cyanosis. No lower extremity edema.  Distal pedal pulses are 2+ bilaterally. Neuro: Alert and oriented X 3. Moves all extremities spontaneously. No focal deficits noted. Psych:  Responds to questions appropriately with a normal affect. Skin: No rashes or lesions noted  Wt Readings from Last 3 Encounters:  05/06/17 174 lb (78.9 kg)  04/08/17 170 lb 6.7 oz (77.3 kg)  08/20/16 165 lb (74.8 kg)     Studies/Labs Reviewed:   EKG:  EKG is ordered today.  The ekg ordered today demonstrates NSR, HR 83, with no acute ST or T-wave changes when compared to prior tracings.   Recent Labs: 04/03/2017: ALT 28; TSH 0.86 04/09/2017: BUN 18; Creatinine, Ser 0.81; Hemoglobin 11.4; Platelets 300; Potassium 4.0; Sodium 140   Lipid Panel    Component Value Date/Time   CHOL 255 (H) 04/03/2017 0830   TRIG 70 04/03/2017 0830   HDL 94 04/03/2017 0830   CHOLHDL 2.7 04/03/2017 0830   VLDL 10 06/27/2016 0808   LDLCALC 121 (H) 06/27/2016 0808     Additional studies/ records that were reviewed today include:   Echocardiogram: 03/2017 Study Conclusions  - Left ventricle: The cavity size was normal. Wall thickness was   normal. Systolic function was normal. The estimated ejection   fraction was in the range of 60% to 65%. Wall motion was normal;   there were no regional wall motion abnormalities. Left   ventricular diastolic function parameters were normal. - Mitral valve: There was mild regurgitation. - Right atrium: Central venous pressure (est): 3 mm Hg. - Atrial septum: No defect or patent foramen ovale was identified. - Tricuspid valve: There was trivial regurgitation. - Pulmonary arteries: Systolic pressure could not be accurately   estimated. - Pericardium, extracardiac: There was no pericardial effusion.  Impressions:  - Normal LV wall thickness with LVEF 60-65% and grossly normal   diastolic function. Mild mitral regurgitation. Trivial tricuspid   regurgitation.   Assessment:    1. Paroxysmal atrial fibrillation (  [place])   2. Current use of long term anticoagulation   3. Essential hypertension   4. Gastroesophageal reflux disease, esophagitis presence not specified      Plan:   In order of problems listed above:  1. Paroxysmal Atrial Fibrillation/ Use of Long-Term Anticoagulation - she was recently admitted in 03/2017 and found to have new-onset atrial fibrillation with RVR. She experienced spontaneous conversion to NSR and was transitioned to PO Cardizem and Eliquis. Echo showed a preserved EF of 60-65% with mild MR.  - she denies any repeat episodes of chest discomfort, dyspnea on exertion, or palpitations. No evidence of active bleeding. Will continue on Cardizem CD 120mg  daily and Eliquis 5mg  BID.   2. HTN - BP is at 136/88 during today's visit. Amlodipine was recently discontinued when she was started on Cardizem. I have asked her to follow her BP at home as she may require further adjustment of her  Cardizem dosing or an additional agent.   3. GERD - reports episodes of a burning sensation along her sternum after consuming tomatoes or other acidic foods which is relieved with ginger mints. No association with exertion or positional changes. Symptoms seem most consistent with GERD. We discussed initiation of PPI therapy but she wishes to continue with PRN TUMS at this time as her symptoms do not occur regularly.     Medication Adjustments/Labs and Tests Ordered: Current medicines are reviewed at length with the patient today.  Concerns regarding medicines are outlined above.  Medication changes, Labs and Tests ordered today are listed in the Patient Instructions below. Patient Instructions  Medication Instructions:  Your physician recommends that you continue on your current medications as directed. Please refer to the Current Medication list given to you today.  Labwork: NONE   Testing/Procedures: NONE   Follow-Up: Your physician recommends that you schedule a follow-up appointment in: 3-4 Months   Any Other Special Instructions Will Be Listed Below (If Applicable).  If you need a refill on your cardiac medications before your next appointment, please call your pharmacy.   Signed, Erma Heritage, PA-C  05/06/2017 5:02 PM    [facility] [address] [place], [place] [address] Phone: [phone]

## 2017-05-06 ENCOUNTER — Ambulatory Visit (INDEPENDENT_AMBULATORY_CARE_PROVIDER_SITE_OTHER): Payer: Self-pay | Admitting: Student

## 2017-05-06 ENCOUNTER — Encounter: Payer: Self-pay | Admitting: Student

## 2017-05-06 VITALS — BP 136/86 | HR 85 | Ht 65.0 in | Wt 174.0 lb

## 2017-05-06 DIAGNOSIS — I480 Paroxysmal atrial fibrillation: Secondary | ICD-10-CM

## 2017-05-06 DIAGNOSIS — K219 Gastro-esophageal reflux disease without esophagitis: Secondary | ICD-10-CM

## 2017-05-06 DIAGNOSIS — Z7901 Long term (current) use of anticoagulants: Secondary | ICD-10-CM

## 2017-05-06 DIAGNOSIS — I10 Essential (primary) hypertension: Secondary | ICD-10-CM

## 2017-05-06 MED ORDER — DILTIAZEM HCL ER COATED BEADS 120 MG PO CP24: 120.0000 mg | ORAL_CAPSULE | Freq: Every day | ORAL | 2 refills | Status: DC

## 2017-05-06 MED ORDER — APIXABAN 5 MG PO TABS: 5.0000 mg | ORAL_TABLET | Freq: Two times a day (BID) | ORAL | 2 refills | Status: DC

## 2017-05-06 NOTE — Patient Instructions (Addendum)
Medication Instructions:  Your physician recommends that you continue on your current medications as directed. Please refer to the Current Medication list given to you today.   Labwork: NONE   Testing/Procedures: NONE   Follow-Up: Your physician recommends that you schedule a follow-up appointment in: 3-4 Months    Any Other Special Instructions Will Be Listed Below (If Applicable).     If you need a refill on your cardiac medications before your next appointment, please call your pharmacy.    Food Choices for Gastroesophageal Reflux Disease, Adult When you have gastroesophageal reflux disease (GERD), the foods you eat and your eating habits are very important. Choosing the right foods can help ease your discomfort. What guidelines do I need to follow?  Choose fruits, vegetables, whole grains, and low-fat dairy products.  Choose low-fat meat, fish, and poultry.  Limit fats such as oils, salad dressings, butter, nuts, and avocado.  Keep a food diary. This helps you identify foods that cause symptoms.  Avoid foods that cause symptoms. These may be different for everyone.  Eat small meals often instead of 3 large meals a day.  Eat your meals slowly, in a place where you are relaxed.  Limit fried foods.  Cook foods using methods other than frying.  Avoid drinking alcohol.  Avoid drinking large amounts of liquids with your meals.  Avoid bending over or lying down until 2-3 hours after eating. What foods are not recommended? These are some foods and drinks that may make your symptoms worse: Vegetables Tomatoes. Tomato juice. Tomato and spaghetti sauce. Chili peppers. Onion and garlic. Horseradish. Fruits Oranges, grapefruit, and lemon (fruit and juice). Meats High-fat meats, fish, and poultry. This includes hot dogs, ribs, ham, sausage, salami, and bacon. Dairy Whole milk and chocolate milk. Sour cream. Cream. Butter. Ice cream. Cream cheese. Drinks Coffee and  tea. Bubbly (carbonated) drinks or energy drinks. Condiments Hot sauce. Barbecue sauce. Sweets/Desserts Chocolate and cocoa. Donuts. Peppermint and spearmint. Fats and Oils High-fat foods. This includes [place] fries and potato chips. Other Vinegar. Strong spices. This includes black pepper, white pepper, red pepper, cayenne, curry powder, cloves, ginger, and chili powder. The items listed above may not be a complete list of foods and drinks to avoid. Contact your dietitian for more information. This information is not intended to replace advice given to you by your health care provider. Make sure you discuss any questions you have with your health care provider. Document Released: 09/15/2011 Document Revised: 08/22/2015 Document Reviewed: 01/18/2013 Elsevier Interactive Patient Education  2017 [employer].

## 2017-06-07 ENCOUNTER — Other Ambulatory Visit: Payer: Self-pay | Admitting: Family Medicine

## 2017-06-07 ENCOUNTER — Other Ambulatory Visit: Payer: Self-pay

## 2017-06-07 ENCOUNTER — Telehealth: Payer: Self-pay

## 2017-06-07 DIAGNOSIS — Z1231 Encounter for screening mammogram for malignant neoplasm of breast: Secondary | ICD-10-CM

## 2017-06-07 MED ORDER — FENOFIBRATE 54 MG PO TABS: 54.0000 mg | ORAL_TABLET | Freq: Every day | ORAL | 1 refills | Status: DC

## 2017-06-07 NOTE — Telephone Encounter (Signed)
Patient coming in soon and wants to know if she needs any lab work done before her visit. Please advise

## 2017-06-07 NOTE — Telephone Encounter (Signed)
Too soon for any labs before the visit

## 2017-06-08 NOTE — Telephone Encounter (Signed)
Please let her know if she calls back

## 2017-06-21 ENCOUNTER — Encounter (HOSPITAL_COMMUNITY): Payer: Self-pay

## 2017-06-21 ENCOUNTER — Ambulatory Visit (HOSPITAL_COMMUNITY)
Admission: RE | Admit: 2017-06-21 | Discharge: 2017-06-21 | Disposition: A | Discharge status: Home / Self Care | Payer: Managed Care, Other (non HMO) | Source: Ambulatory Visit | Attending: Family Medicine | Admitting: Family Medicine

## 2017-06-21 DIAGNOSIS — Z1231 Encounter for screening mammogram for malignant neoplasm of breast: Secondary | ICD-10-CM | POA: Diagnosis not present

## 2017-06-22 ENCOUNTER — Encounter: Payer: Self-pay | Admitting: Family Medicine

## 2017-06-22 ENCOUNTER — Ambulatory Visit (INDEPENDENT_AMBULATORY_CARE_PROVIDER_SITE_OTHER): Payer: Managed Care, Other (non HMO) | Admitting: Family Medicine

## 2017-06-22 ENCOUNTER — Other Ambulatory Visit: Payer: Self-pay

## 2017-06-22 VITALS — BP 118/84 | HR 77 | Ht 64.0 in | Wt 172.0 lb

## 2017-06-22 DIAGNOSIS — I10 Essential (primary) hypertension: Secondary | ICD-10-CM

## 2017-06-22 DIAGNOSIS — E663 Overweight: Secondary | ICD-10-CM | POA: Diagnosis not present

## 2017-06-22 DIAGNOSIS — E785 Hyperlipidemia, unspecified: Secondary | ICD-10-CM

## 2017-06-22 DIAGNOSIS — I4891 Unspecified atrial fibrillation: Secondary | ICD-10-CM | POA: Diagnosis not present

## 2017-06-22 NOTE — Progress Notes (Signed)
   Cindy Weiss     MRN: [id number]      DOB: Aug 23, 1960   HPI Cindy Weiss is here for follow up and re-evaluation of chronic medical conditions, medication management and review of any available recent lab and radiology data.  Preventive health is updated, specifically  Cancer screening and Immunization.   Had left wrist fracture on the job last Summer, and was hospitalized in January  With a fib  The PT feels as though one of her new medications is causing a skin rash, but this is getting better. She denies epistaxis, , rectal bleeding or hematuria  On blood thinner. She denies heart racing    ROS Denies recent fever or chills. Denies sinus pressure, nasal congestion, ear pain or sore throat. Denies chest congestion, productive cough or wheezing. Denies chest pains, palpitations and leg swelling Denies abdominal pain, nausea, vomiting,diarrhea or constipation.   Denies dysuria, frequency, hesitancy or incontinence. Denies joint pain, swelling and limitation in mobility. Denies headaches, seizures, numbness, or tingling. Denies depression, anxiety or insomnia. Denies skin break down or rash.   PE  BP 118/84   Pulse 77   Ht 5\' 4"  (1.626 m)   Wt 172 lb 0.6 oz (78 kg)   SpO2 97%   BMI 29.53 kg/m   Patient alert and oriented and in no cardiopulmonary distress.  HEENT: No facial asymmetry, EOMI,   oropharynx pink and moist.  Neck supple no JVD, no mass.  Chest: Clear to auscultation bilaterally.  CVS: S1, S2 no murmurs, no S3.Regular rate.  ABD: Soft non tender.   Ext: No edema  MS: Adequate ROM spine, shoulders, hips and knees.  Skin: Intact, no ulcerations or rash noted.  Psych: Good eye contact, normal affect. Memory intact not anxious or depressed appearing.  CNS: CN 2-12 intact, power,  normal throughout.no focal deficits noted.   Assessment & Plan  Essential hypertension Controlled, no change in medication DASH diet and commitment to daily physical  activity for a minimum of 30 minutes discussed and encouraged, as a part of hypertension management. The importance of attaining a healthy weight is also discussed.  BP/Weight 06/22/2017 05/06/2017 04/09/2017 04/08/2017 08/20/2016 02/18/2016 16/60/6301  Systolic BP 601 093 235 - 573 220 254  Diastolic BP 84 86 83 - 88 83 74  Wt. (Lbs) 172.04 174 - 170.42 165 164 164  BMI 29.53 28.96 - 28.36 28.32 28.15 28.15       Hyperlipidemia Hyperlipidemia:Low fat diet discussed and encouraged.   Lipid Panel  Lab Results  Component Value Date   CHOL 255 (H) 04/03/2017   HDL 94 04/03/2017   LDLCALC 144 (H) 04/03/2017   TRIG 70 04/03/2017   CHOLHDL 2.7 04/03/2017   Uncontrolled, not at goal Updated lab needed    Overweight Deteriorated. Patient re-educated about  the importance of commitment to a  minimum of 150 minutes of exercise per week.  The importance of healthy food choices with portion control discussed. Encouraged to start a food diary, count calories and to consider  joining a support group. Sample diet sheets offered. Goals set by the patient for the next several months.   Weight /BMI 06/22/2017 05/06/2017 04/08/2017  WEIGHT 172 lb 0.6 oz 174 lb 170 lb 6.7 oz  HEIGHT 5\' 4"  5\' 5"  -  BMI 29.53 kg/m2 28.96 kg/m2 -      Atrial fibrillation with RVR ([facility]) Rate controlled and on chronic anti coagullation

## 2017-06-22 NOTE — Patient Instructions (Signed)
Please keep appt in May as before , call if you need me sooner   Thankful you are improved  Fasting lipid, cmp and EGFR 1 week before next appointment  It is important that you exercise regularly at least 30 minutes 5 times a week. If you develop chest pain, have severe difficulty breathing, or feel very tired, stop exercising immediately and seek medical attention  Thank you  for choosing [facility]. We consider it a privelige to serve you.  Delivering excellent health care in a caring and  compassionate way is our goal.  Partnering with you,  so that together we can achieve this goal is our strategy.

## 2017-06-27 ENCOUNTER — Encounter: Payer: Self-pay | Admitting: Family Medicine

## 2017-06-27 NOTE — Assessment & Plan Note (Signed)
Controlled, no change in medication DASH diet and commitment to daily physical activity for a minimum of 30 minutes discussed and encouraged, as a part of hypertension management. The importance of attaining a healthy weight is also discussed.  BP/Weight 06/22/2017 05/06/2017 04/09/2017 04/08/2017 08/20/2016 02/18/2016 64/38/3779  Systolic BP 396 886 484 - 720 721 828  Diastolic BP 84 86 83 - 88 83 74  Wt. (Lbs) 172.04 174 - 170.42 165 164 164  BMI 29.53 28.96 - 28.36 28.32 28.15 28.15

## 2017-06-27 NOTE — Assessment & Plan Note (Signed)
Rate controlled and on chronic anticoagullation 

## 2017-06-27 NOTE — Assessment & Plan Note (Signed)
Hyperlipidemia:Low fat diet discussed and encouraged.   Lipid Panel  Lab Results  Component Value Date   CHOL 255 (H) 04/03/2017   HDL 94 04/03/2017   LDLCALC 144 (H) 04/03/2017   TRIG 70 04/03/2017   CHOLHDL 2.7 04/03/2017   Uncontrolled, not at goal Updated lab needed

## 2017-06-27 NOTE — Assessment & Plan Note (Signed)
Deteriorated. Patient re-educated about  the importance of commitment to a  minimum of 150 minutes of exercise per week.  The importance of healthy food choices with portion control discussed. Encouraged to start a food diary, count calories and to consider  joining a support group. Sample diet sheets offered. Goals set by the patient for the next several months.   Weight /BMI 06/22/2017 05/06/2017 04/08/2017  WEIGHT 172 lb 0.6 oz 174 lb 170 lb 6.7 oz  HEIGHT 5\' 4"  5\' 5"  -  BMI 29.53 kg/m2 28.96 kg/m2 -

## 2017-06-28 ENCOUNTER — Telehealth: Payer: Self-pay | Admitting: Cardiology

## 2017-06-28 NOTE — Telephone Encounter (Signed)
Please give pt a call--she's having a rash from her medication

## 2017-06-28 NOTE — Telephone Encounter (Signed)
Left thigh and right arm has rash, wonders if it is from Eliquis.Reports itchy areas.

## 2017-06-29 NOTE — Telephone Encounter (Signed)
[id number] 

## 2017-06-29 NOTE — Telephone Encounter (Addendum)
   This is a very uncommon side effect to Eliquis (< 1%).  At the time of hospital discharge, she was also started on Cardizem CD which would still be rare but can occur in up to 5% of patients.  If the rash is new since discharge, I would recommend switching Cardizem CD to Toprol-XL 25 mg daily to see if this helps with her symptoms. She should continue to follow blood pressure with this medication adjustment.  Thanks,  [place]

## 2017-06-29 NOTE — Telephone Encounter (Signed)
Pt called to state that she continues to have rash. Pt states that she has been treating rash with alcohol. Informed pt not to use alcohol on rash. Pt will continue current cardiac meds and see PCP about rash.

## 2017-07-23 ENCOUNTER — Telehealth: Payer: Self-pay | Admitting: Cardiology

## 2017-07-23 NOTE — Telephone Encounter (Signed)
Pt has a question about a laxative she'd like to take this weekend.   Also thinks she's having reaction to her Eliquis

## 2017-07-26 MED ORDER — METOPROLOL SUCCINATE ER 25 MG PO TB24: 25.0000 mg | ORAL_TABLET | Freq: Every day | ORAL | 6 refills | Status: DC

## 2017-07-26 NOTE — Progress Notes (Deleted)
Cardiology Office Note    Date:  07/26/2017   ID:  Cindy Weiss, DOB Nov 07, 1960, MRN [id number]  PCP:  Fayrene Helper, MD  Cardiologist: No primary care provider on file.    No chief complaint on file.   History of Present Illness:    Cindy Weiss is a 57 y.o. female with past medical history of PAF (diagnosed in 03/2017, on Eliquis for anticoagulation), HTN, and HLD who presents to the office today for evaluation of ***.   She was last examined by myself in 04/2017 following a recent hospitalization for dyspnea and chest pain, found to be in atrial fibrillation with RVR at that time.  Previously converted back to normal sinus rhythm and at the time of her follow-up visit, she denied any recurrent symptoms.  She was continued on Cardizem CD 120 mg daily and Eliquis 5 mg twice daily for anticoagulation.  She called the office on 06/29/2017 reporting a rash which she thought was secondary to her medications, therefore Cardizem CD was discontinued and she was switched to Toprol-XL 25 mg daily. She did not make these changes and called the office again on 07/26/2017 reporting the same symptoms and was advised to switch medications.   Past Medical History:  Diagnosis Date  . Anemia   . Fibroids   . Hyperlipidemia   . Hypertension   . PAF (paroxysmal atrial fibrillation) ([place])    a. diagnosed in 03/2017. Spontaneous conversion to NSR and started on Eliquis for anticoagulation    Past Surgical History:  Procedure Laterality Date  . BREAST EXCISIONAL BIOPSY Left    benign  . MYOMECTOMY      Current Medications: Outpatient Medications Prior to Visit  Medication Sig Dispense Refill  . apixaban (ELIQUIS) 5 MG TABS tablet Take 1 tablet (5 mg total) by mouth 2 (two) times daily. 60 tablet 2  . calcium carbonate (OS-CAL - DOSED IN MG OF ELEMENTAL CALCIUM) 1250 (500 Ca) MG tablet Take 2 tablets by mouth daily with breakfast.    . fenofibrate 54 MG tablet Take 1 tablet (54 mg  total) by mouth daily. 30 tablet 1  . metoprolol succinate (TOPROL XL) 25 MG 24 hr tablet Take 1 tablet (25 mg total) by mouth daily. 30 tablet 6  . Multiple Vitamins-Minerals (ALIVE WOMENS 50+ PO) Take 1 tablet by mouth daily.     No facility-administered medications prior to visit.      Allergies:   Pravastatin   Social History   Socioeconomic History  . Marital status: Married    Spouse name: Not on file  . Number of children: Not on file  . Years of education: Not on file  . Highest education level: Not on file  Occupational History  . Not on file  Social Needs  . Financial resource strain: Not on file  . Food insecurity:    Worry: Not on file    Inability: Not on file  . Transportation needs:    Medical: Not on file    Non-medical: Not on file  Tobacco Use  . Smoking status: Never Smoker  . Smokeless tobacco: Never Used  Substance and Sexual Activity  . Alcohol use: No  . Drug use: No  . Sexual activity: Not on file  Lifestyle  . Physical activity:    Days per week: Not on file    Minutes per session: Not on file  . Stress: Not on file  Relationships  . Social connections:  Talks on phone: Not on file    Gets together: Not on file    Attends religious service: Not on file    Active member of club or organization: Not on file    Attends meetings of clubs or organizations: Not on file    Relationship status: Not on file  Other Topics Concern  . Not on file  Social History Narrative  . Not on file     Family History:  The patient's ***family history includes Hyperlipidemia in her mother; Hypertension in her sister; Thyroid disease in her father.   Review of Systems:   Please see the history of present illness.     General:  No chills, fever, night sweats or weight changes.  Cardiovascular:  No chest pain, dyspnea on exertion, edema, orthopnea, palpitations, paroxysmal nocturnal dyspnea. Dermatological: No rash, lesions/masses Respiratory: No cough,  dyspnea Urologic: No hematuria, dysuria Abdominal:   No nausea, vomiting, diarrhea, bright red blood per rectum, melena, or hematemesis Neurologic:  No visual changes, wkns, changes in mental status. All other systems reviewed and are otherwise negative except as noted above.   Physical Exam:    VS:  There were no vitals taken for this visit.   General: Well developed, well nourished,female appearing in no acute distress. Head: Normocephalic, atraumatic, sclera non-icteric, no xanthomas, nares are without discharge.  Neck: No carotid bruits. JVD not elevated.  Lungs: Respirations regular and unlabored, without wheezes or rales.  Heart: ***Regular rate and rhythm. No S3 or S4.  No murmur, no rubs, or gallops appreciated. Abdomen: Soft, non-tender, non-distended with normoactive bowel sounds. No hepatomegaly. No rebound/guarding. No obvious abdominal masses. Msk:  Strength and tone appear normal for age. No joint deformities or effusions. Extremities: No clubbing or cyanosis. No edema.  Distal pedal pulses are 2+ bilaterally. Neuro: Alert and oriented X 3. Moves all extremities spontaneously. No focal deficits noted. Psych:  Responds to questions appropriately with a normal affect. Skin: No rashes or lesions noted  Wt Readings from Last 3 Encounters:  06/22/17 172 lb 0.6 oz (78 kg)  05/06/17 174 lb (78.9 kg)  04/08/17 170 lb 6.7 oz (77.3 kg)        Studies/Labs Reviewed:   EKG:  EKG is*** ordered today.  The ekg ordered today demonstrates ***  Recent Labs: 04/03/2017: ALT 28; TSH 0.86 04/09/2017: BUN 18; Creatinine, Ser 0.81; Hemoglobin 11.4; Platelets 300; Potassium 4.0; Sodium 140   Lipid Panel    Component Value Date/Time   CHOL 255 (H) 04/03/2017 0830   TRIG 70 04/03/2017 0830   HDL 94 04/03/2017 0830   CHOLHDL 2.7 04/03/2017 0830   VLDL 10 06/27/2016 0808   LDLCALC 144 (H) 04/03/2017 0830    Additional studies/ records that were reviewed today include:    Echocardiogram: 04/08/2017 Study Conclusions  - Left ventricle: The cavity size was normal. Wall thickness was   normal. Systolic function was normal. The estimated ejection   fraction was in the range of 60% to 65%. Wall motion was normal;   there were no regional wall motion abnormalities. Left   ventricular diastolic function parameters were normal. - Mitral valve: There was mild regurgitation. - Right atrium: Central venous pressure (est): 3 mm Hg. - Atrial septum: No defect or patent foramen ovale was identified. - Tricuspid valve: There was trivial regurgitation. - Pulmonary arteries: Systolic pressure could not be accurately   estimated. - Pericardium, extracardiac: There was no pericardial effusion.  Impressions:  - Normal LV wall thickness  with LVEF 60-65% and grossly normal   diastolic function. Mild mitral regurgitation. Trivial tricuspid   regurgitation.   Assessment:    No diagnosis found.   Plan:   In order of problems listed above:  1. ***    Medication Adjustments/Labs and Tests Ordered: Current medicines are reviewed at length with the patient today.  Concerns regarding medicines are outlined above.  Medication changes, Labs and Tests ordered today are listed in the Patient Instructions below. There are no Patient Instructions on file for this visit.   Signed, Erma Heritage, PA-C  07/26/2017 6:17 PM    [facility] [address] [place], [place] [address] Phone: [phone]

## 2017-07-26 NOTE — Telephone Encounter (Signed)
Pt will stop cardizem later this week,cannot afford to buy toprol now,declined apt for tomorrow

## 2017-07-26 NOTE — Telephone Encounter (Signed)
Note continued: rash all over which is itchy.Per advice from B Strader PA-C stop cardizem and start Toprol XL 25 mg daily

## 2017-07-26 NOTE — Addendum Note (Signed)
Addended by: Barbarann Ehlers A on: 07/26/2017 04:26 PM   Modules accepted: Orders

## 2017-07-26 NOTE — Telephone Encounter (Signed)
[id number] 

## 2017-07-26 NOTE — Telephone Encounter (Signed)
Patient has rash all over

## 2017-07-27 ENCOUNTER — Ambulatory Visit: Payer: Managed Care, Other (non HMO) | Admitting: Student

## 2017-07-28 ENCOUNTER — Telehealth: Payer: Self-pay | Admitting: *Deleted

## 2017-07-28 ENCOUNTER — Telehealth: Payer: Self-pay | Admitting: Cardiology

## 2017-07-28 NOTE — Telephone Encounter (Addendum)
Pt in office and noted to have dime and quarter sized whelps to chest, arms, neck and face that is red in color. Pt reports that her "heart medication"  Is causing this. Pt stopped Cardizem yesterday and started Toprol XL 25 mg today. Pt reports that she has had rash since last telephone call to office on 06/28/17. Pt encouraged to be seen by PCP or urgent care office. Please advise.

## 2017-07-28 NOTE — Telephone Encounter (Signed)
Pt called stating she's having a reaction to her meds. It's gotten worse and it's very uncomfortable and would like to know if there's anything that can help her.   She's at work now and can be reached @ [phone] if she's not there then call the [phone] and leave it on the answering machine.

## 2017-07-28 NOTE — Telephone Encounter (Signed)
Agree with switching to Toprol-XL as discussed in prior phone notes. Should follow-up with PCP if symptoms do not improve.   Signed, Erma Heritage, PA-C 07/28/2017, 5:14 PM Pager: [phone]

## 2017-07-29 NOTE — Telephone Encounter (Signed)
error 

## 2017-08-02 ENCOUNTER — Telehealth: Payer: Self-pay | Admitting: Student

## 2017-08-02 MED ORDER — APIXABAN 5 MG PO TABS: 5.0000 mg | ORAL_TABLET | Freq: Two times a day (BID) | ORAL | 2 refills | Status: DC

## 2017-08-02 NOTE — Telephone Encounter (Signed)
Pt's pharmacist Allene Dillon called, [phone] stating patinet still has rash and she believes it is from Eliquis.  We stopped diltiazem and started her on toprol last week    Please advise

## 2017-08-02 NOTE — Telephone Encounter (Signed)
I spoke with other pharmacist as Larene Beach had stepped out for a minute.They are aware we are Holding Eliquis.I left a mesage with a female at pt's preferred number and asked taht she call me

## 2017-08-02 NOTE — Telephone Encounter (Signed)
FYI: Pharmacist thinks that patient's rash is coming from Eliquis. / tg

## 2017-08-02 NOTE — Telephone Encounter (Signed)
Please stop the Eliquis to see if the rash resolves.

## 2017-08-03 NOTE — Telephone Encounter (Signed)
Patient walked into clinic and was told to stop eliquis and let us know how itching and rash is

## 2017-08-03 NOTE — Addendum Note (Signed)
Addended by: Barbarann Ehlers A on: 08/03/2017 10:14 AM   Modules accepted: Orders

## 2017-08-09 ENCOUNTER — Telehealth: Payer: Self-pay | Admitting: Cardiology

## 2017-08-09 NOTE — Telephone Encounter (Signed)
Thank you for the FYI. Can we follow-up with her to see if the rash has improved since being off Eliquis? If she is willing to try another anticoagulant, would provide with samples of Xarelto 20mg  daily or 30-day free card to see if she can tolerate this.   Thanks,  [place]

## 2017-08-09 NOTE — Telephone Encounter (Signed)
See previous notes, pt stopped CCB, placed on BB, stopped eliquis

## 2017-08-10 ENCOUNTER — Emergency Department (HOSPITAL_COMMUNITY)
Admission: EM | Admit: 2017-08-10 | Discharge: 2017-08-10 | Disposition: A | Discharge status: Home / Self Care | Payer: Managed Care, Other (non HMO) | Attending: Emergency Medicine | Admitting: Emergency Medicine

## 2017-08-10 ENCOUNTER — Encounter (HOSPITAL_COMMUNITY): Payer: Self-pay | Admitting: Emergency Medicine

## 2017-08-10 ENCOUNTER — Other Ambulatory Visit: Payer: Self-pay

## 2017-08-10 DIAGNOSIS — R21 Rash and other nonspecific skin eruption: Secondary | ICD-10-CM | POA: Insufficient documentation

## 2017-08-10 DIAGNOSIS — Z79899 Other long term (current) drug therapy: Secondary | ICD-10-CM | POA: Insufficient documentation

## 2017-08-10 DIAGNOSIS — I10 Essential (primary) hypertension: Secondary | ICD-10-CM | POA: Diagnosis not present

## 2017-08-10 MED ORDER — CETIRIZINE HCL 10 MG PO TABS: 10.0000 mg | ORAL_TABLET | Freq: Every day | ORAL | 0 refills | Status: DC

## 2017-08-10 MED ORDER — PREDNISONE 10 MG (21) PO TBPK: ORAL_TABLET | ORAL | 0 refills | Status: DC

## 2017-08-10 MED ORDER — FAMOTIDINE 20 MG PO TABS: 20.0000 mg | ORAL_TABLET | Freq: Two times a day (BID) | ORAL | 0 refills | Status: DC

## 2017-08-10 NOTE — ED Triage Notes (Signed)
Pt c/o rash for over a month and has seen pcp for the same.

## 2017-08-10 NOTE — Discharge Instructions (Addendum)
Zyrtec: Take the zyrtec daily.  Pepcid: Take the Pepcid, as prescribed, over the next 3 days. Prednisone: Take prednisone, as prescribed, until finished.  Follow-up with your primary care provider and a dermatologist on this matter.  Allergy testing with an allergist may be warranted.  Return to the ED for worsening symptoms, shortness of breath, chest pain, palpitations, persistent vomiting, facial or throat swelling, or any other major concerns.

## 2017-08-10 NOTE — ED Provider Notes (Signed)
[facility] EMERGENCY DEPARTMENT Provider Note   CSN: [id number] Arrival date & time: 08/10/17  2001     History   Chief Complaint Chief Complaint  Patient presents with  . Rash    HPI Cindy Weiss is a 57 y.o. female.  HPI   Cindy Weiss is a 57 y.o. female, with a history of hyperlipidemia, paroxysmal A. fib, and HTN, presenting to the ED with a pruritic rash for over a month.  She has been in touch with her PCP as well as her cardiologist on this matter.  Patient was switched from Cardizem to Toprol-XL May 1 due to the possibility that the Cardizem was causing her rash.  Patient noted minimal improvement.  She has tried Benadryl and topical hydrocortisone with temporary improvement.  She was then removed from her Eliquis and switch to Xarelto on May 6.  This provided the most improvement.  Patient still notes some pruritic, scattered lesions on the arms and back of the legs.  Denies fever/chills, nausea/vomiting, difficulty breathing, facial or throat swelling, or any other complaints.    Past Medical History:  Diagnosis Date  . Anemia   . Fibroids   . Hyperlipidemia   . Hypertension   . PAF (paroxysmal atrial fibrillation) ([place])    a. diagnosed in 03/2017. Spontaneous conversion to NSR and started on Eliquis for anticoagulation    Patient Active Problem List   Diagnosis Date Noted  . Atrial fibrillation with RVR ([place]) 04/07/2017  . Essential hypertension 04/07/2017  . Impaired glucose tolerance 04/07/2017  . Prediabetes 06/02/2012  . HTN (hypertension) 12/08/2010  . Overweight 09/21/2010  . FIBROIDS, UTERUS 03/04/2009  . Hyperlipidemia 02/02/2008    Past Surgical History:  Procedure Laterality Date  . BREAST EXCISIONAL BIOPSY Left    benign  . MYOMECTOMY       OB History   None      Home Medications    Prior to Admission medications   Medication Sig Start Date End Date Taking? Authorizing Provider  calcium carbonate (OS-CAL - DOSED IN MG OF  ELEMENTAL CALCIUM) 1250 (500 Ca) MG tablet Take 2 tablets by mouth daily with breakfast.    [provider]  cetirizine (ZYRTEC ALLERGY) 10 MG tablet Take 1 tablet (10 mg total) by mouth daily. 08/10/17   Joy, Shawn C, PA-C  famotidine (PEPCID) 20 MG tablet Take 1 tablet (20 mg total) by mouth 2 (two) times daily for 3 days. 08/10/17 08/13/17  Joy, Shawn C, PA-C  fenofibrate 54 MG tablet Take 1 tablet (54 mg total) by mouth daily. 06/07/17   Fayrene Helper, MD  metoprolol succinate (TOPROL XL) 25 MG 24 hr tablet Take 1 tablet (25 mg total) by mouth daily. 07/26/17   Strader, Fransisco Hertz, PA-C  Multiple Vitamins-Minerals (ALIVE WOMENS 50+ PO) Take 1 tablet by mouth daily.    [provider]  predniSONE (STERAPRED UNI-PAK 21 TAB) 10 MG (21) TBPK tablet Take 6 tabs (60mg ) on day 1, 5 tabs (50mg ) on day 2, 4 tabs (40mg ) on day 3, 3 tabs (30mg ) on day 4, 2 tabs (20mg ) on day 5, and 1 tab (10mg ) on day 6. 08/10/17   Joy, Helane Gunther, PA-C    Family History Family History  Problem Relation Age of Onset  . Hyperlipidemia Mother   . Thyroid disease Father   . Hypertension Sister     Social History Social History   Tobacco Use  . Smoking status: Never Smoker  . Smokeless  tobacco: Never Used  Substance Use Topics  . Alcohol use: No  . Drug use: No     Allergies   Pravastatin   Review of Systems Review of Systems  Constitutional: Negative for fever.  HENT: Negative for trouble swallowing and voice change.   Respiratory: Negative for shortness of breath.   Cardiovascular: Negative for chest pain.  Gastrointestinal: Negative for abdominal pain, diarrhea, nausea and vomiting.  Musculoskeletal: Negative for arthralgias.  Neurological: Negative for dizziness, weakness, light-headedness, numbness and headaches.     Physical Exam Updated Vital Signs BP (!) 165/95   Pulse 70   Temp 97.7 F (36.5 C)   Resp 18   Ht 5\' 5"  (1.651 m)   Wt 77.1 kg (170 lb)   SpO2 100%   BMI  28.29 kg/m   Physical Exam  Constitutional: She appears well-developed and well-nourished. No distress.  HENT:  Head: Normocephalic and atraumatic.  Mouth/Throat: Oropharynx is clear and moist.  No intraoral or periorbital lesions.  No lesions in the ear canals.  Eyes: Conjunctivae are normal.  Neck: Neck supple.  Cardiovascular: Normal rate, regular rhythm and intact distal pulses.  Pulmonary/Chest: Effort normal.  Neurological: She is alert.  Skin: Skin is warm and dry. Rash noted. She is not diaphoretic. No pallor.  Individual, scattered, macular erythematous lesions on the patient's arms and upper legs.  Associated excoriations.  No noted pustules or vesicles.  Spares the palms and the soles.  Psychiatric: She has a normal mood and affect. Her behavior is normal.  Nursing note and vitals reviewed.    ED Treatments / Results  Labs (all labs ordered are listed, but only abnormal results are displayed) Labs Reviewed - No data to display  EKG None  Radiology No results found.  Procedures Procedures (including critical care time)  Medications Ordered in ED Medications - No data to display   Initial Impression / Assessment and Plan / ED Course  I have reviewed the triage vital signs and the nursing notes.  Pertinent labs & imaging results that were available during my care of the patient were reviewed by me and considered in my medical decision making (see chart for details).     Patient presents with a pruritic rash for the last several weeks.  Some improvement with her medication changes, however, rash persists.  Patient has no accompanying symptoms.  We will try more involved symptom control with H1 and H2 blockers and a course of prednisone.  Dermatology follow-up as needed. The patient was given instructions for home care as well as return precautions. Patient voices understanding of these instructions, accepts the plan, and is comfortable with discharge.  Final  Clinical Impressions(s) / ED Diagnoses   Final diagnoses:  Rash    ED Discharge Orders        Ordered    cetirizine (ZYRTEC ALLERGY) 10 MG tablet  Daily     08/10/17 2133    famotidine (PEPCID) 20 MG tablet  2 times daily     08/10/17 2133    predniSONE (STERAPRED UNI-PAK 21 TAB) 10 MG (21) TBPK tablet     08/10/17 2133       Lorayne Bender, PA-C 08/10/17 2141    Nat Christen, MD 08/11/17 1651

## 2017-08-10 NOTE — Telephone Encounter (Signed)
Cindy Person PA-C spoke with patient on phone yesterday. Patient agreed to try Xarelto 20 mg daily. One month supply will be supplied to patient, along with 30 free trial card.     4 bottles with 7 tablets each, Xarelto lot [id number], exp 03/21

## 2017-08-11 ENCOUNTER — Other Ambulatory Visit: Payer: Self-pay | Admitting: Family Medicine

## 2017-08-25 LAB — COMPLETE METABOLIC PANEL WITH GFR
AG RATIO: 1.6 (calc) (ref 1.0–2.5)
ALBUMIN MSPROF: 4.5 g/dL (ref 3.6–5.1)
ALKALINE PHOSPHATASE (APISO): 77 U/L (ref 33–130)
ALT: 16 U/L (ref 6–29)
AST: 14 U/L (ref 10–35)
BILIRUBIN TOTAL: 0.5 mg/dL (ref 0.2–1.2)
BUN: 10 mg/dL (ref 7–25)
CHLORIDE: 106 mmol/L (ref 98–110)
CO2: 26 mmol/L (ref 20–32)
CREATININE: 0.75 mg/dL (ref 0.50–1.05)
Calcium: 9.9 mg/dL (ref 8.6–10.4)
GFR, Est African American: 103 mL/min/{1.73_m2} (ref >=60)
GFR, Est Non African American: 88 mL/min/{1.73_m2} (ref >=60)
GLOBULIN: 2.8 g/dL (ref 1.9–3.7)
Glucose, Bld: 80 mg/dL (ref 65–99)
POTASSIUM: 3.7 mmol/L (ref 3.5–5.3)
SODIUM: 140 mmol/L (ref 135–146)
Total Protein: 7.3 g/dL (ref 6.1–8.1)

## 2017-08-25 LAB — LIPID PANEL
CHOL/HDL RATIO: 2.6 (calc) (ref <5.0)
Cholesterol: 207 mg/dL — ABNORMAL HIGH (ref <200)
HDL: 79 mg/dL (ref >50)
LDL CHOLESTEROL (CALC): 114 mg/dL — AB
Non-HDL Cholesterol (Calc): 128 mg/dL (calc) (ref <130)
TRIGLYCERIDES: 47 mg/dL (ref <150)

## 2017-08-26 ENCOUNTER — Ambulatory Visit (INDEPENDENT_AMBULATORY_CARE_PROVIDER_SITE_OTHER): Payer: Self-pay | Admitting: Family Medicine

## 2017-08-26 ENCOUNTER — Other Ambulatory Visit (HOSPITAL_COMMUNITY)
Admission: RE | Admit: 2017-08-26 | Discharge: 2017-08-26 | Disposition: A | Discharge status: Home / Self Care | Payer: Managed Care, Other (non HMO) | Source: Ambulatory Visit | Attending: Family Medicine | Admitting: Family Medicine

## 2017-08-26 ENCOUNTER — Encounter: Payer: Self-pay | Admitting: Family Medicine

## 2017-08-26 VITALS — BP 144/90 | HR 87 | Resp 16 | Ht 64.0 in | Wt 170.0 lb

## 2017-08-26 DIAGNOSIS — F4321 Adjustment disorder with depressed mood: Secondary | ICD-10-CM

## 2017-08-26 DIAGNOSIS — Z124 Encounter for screening for malignant neoplasm of cervix: Secondary | ICD-10-CM

## 2017-08-26 DIAGNOSIS — Z1211 Encounter for screening for malignant neoplasm of colon: Secondary | ICD-10-CM

## 2017-08-26 DIAGNOSIS — I10 Essential (primary) hypertension: Secondary | ICD-10-CM

## 2017-08-26 DIAGNOSIS — Z0000 Encounter for general adult medical examination without abnormal findings: Secondary | ICD-10-CM

## 2017-08-26 NOTE — Patient Instructions (Addendum)
F/U in 4  5 [place] Progression Recent Vital Signs /Orders  BP (!) 144/90   Pulse 87   Resp 16   Ht 5\' 4"  (1.626 m)   SpO2 96%   BMI 29.18 kg/m    Past Medical History   Past Medical History:  Diagnosis Date  . Anemia   . Fibroids   . Hyperlipidemia   . Hypertension   . PAF (paroxysmal atrial fibrillation) ([place])    a. diagnosed in 03/2017. Spontaneous conversion to NSR and started on Eliquis for anticoagulation     Expected Discharge Date     Diet   Diet Order    None       VTE Documentation      Work Intensity Score     Mobility     Consult Orders     CBG Orders     Abnormal Labs    Tula Nakayama 08/26/2017, 4:27 PM months 5 [place] Progression Recent Vital Signs /Orders  BP (!) 144/90   Pulse 87   Resp 16   Ht 5\' 4"  (1.626 m)   SpO2 96%   BMI 29.18 kg/m    Past Medical History   Past Medical History:  Diagnosis Date  . Anemia   . Fibroids   . Hyperlipidemia   . Hypertension   . PAF (paroxysmal atrial fibrillation) ([place])    a. diagnosed in 03/2017. Spontaneous conversion to NSR and started on Eliquis for anticoagulation     Expected Discharge Date     Diet   Diet Order    None       VTE Documentation      Work Intensity Score     Mobility     Consult Orders     CBG Orders     Abnormal Labs    Tula Nakayama 08/26/2017, 4:27 PM mo

## 2017-08-27 ENCOUNTER — Other Ambulatory Visit: Payer: Self-pay | Admitting: Student

## 2017-08-27 ENCOUNTER — Encounter: Payer: Self-pay | Admitting: Family Medicine

## 2017-08-27 DIAGNOSIS — F4321 Adjustment disorder with depressed mood: Secondary | ICD-10-CM | POA: Insufficient documentation

## 2017-08-27 MED ORDER — RIVAROXABAN 20 MG PO TABS: 20.0000 mg | ORAL_TABLET | Freq: Every day | ORAL | 3 refills | Status: DC

## 2017-08-27 NOTE — Telephone Encounter (Signed)
Pt OUT OF HER SAMPLES OF XARELTO AND SHE EITHER NEEDS A RX TO BE SENT TO HER PHARMACY OR SHE NEEDS ANOTHER SAMPLE  CAN BE REACHED @ [phone]  STATED SHE HAD ENOUGH IN HER SAMPLE TO LAST THROUGH THE WEEKEND

## 2017-08-27 NOTE — Assessment & Plan Note (Signed)
Elevated BP at visit, however pt significantly stressed at visit, tearful and sleep deprived due to acute grief following loss spouse and accompanying circumstances. Has upcoming appt with cardiology, will defer to cardiology to med adjust if needed

## 2017-08-27 NOTE — Telephone Encounter (Signed)
Sent in RX

## 2017-08-27 NOTE — Progress Notes (Signed)
    Cindy Weiss     MRN: [id number]      DOB: 28-Aug-1960  HPI: Patient is in for annual physical exam. Reports fluctuation in her blood pressure C/o rash with medication for anticoagulation Found out 2 days ago that her husband who had been missing for 5 days , was found dead in his car 2 days ago , she is understandably devastated and in shock. Reports and has good family support, states no need for counseling/ therapy at this time  Recent labs,are reviewed. Immunization is reviewed , and  Is up to date   PE: BP (!) 144/90   Pulse 87   Resp 16   Ht 5\' 4"  (1.626 m)   SpO2 96%   BMI 29.18 kg/m   Pleasant  female, alert and oriented x 3, in no cardio-pulmonary distress.Pt grieving and numb Afebrile. HEENT No facial trauma or asymetry. Sinuses non tender.  Extra occullar muscles intact. External ears normal,  Oropharynx moist, no exudate. Neck: supple, no adenopathy,JVD or thyromegaly.No bruits.  Chest: Clear to ascultation bilaterally.No crackles or wheezes. Non tender to palpation  Breast: No asymetry,no masses or lumps. No tenderness. No nipple discharge or inversion. No axillary or supraclavicular adenopathy  Cardiovascular system; Heart sounds normal,  S1 and  S2 ,no S3.  No murmur, or thrill. Apical beat not displaced Peripheral pulses normal.  Abdomen: Soft, non tender, no organomegaly or masses. No bruits. Bowel sounds normal. No guarding, tenderness or rebound.  Rectal:  Deferred  GU: External genitalia normal female genitalia , normal female distribution of hair. No lesions. Urethral meatus normal in size, no  Prolapse, no lesions visibly  Present. Bladder non tender. Vagina pink and moist , with no visible lesions , discharge present . Adequate pelvic support no  cystocele or rectocele noted Cervix pink and appears healthy, no lesions or ulcerations noted, no discharge noted from os Uterus enlarged , no adnexal masses, no cervical motion or adnexal  tenderness.   Musculoskeletal exam: Full ROM of spine, hips , shoulders and knees. No deformity ,swelling or crepitus noted. No muscle wasting or atrophy.   Neurologic: Cranial nerves 2 to 12 intact. Power, tone ,sensation and reflexes normal throughout. No disturbance in gait. No tremor.  Skin: Intact, no ulceration, erythema , scaling or rash noted. Hyper Pigmented macular  Lesions noted on lower extremities  Psych; Tearful and depressed  mood and affect. Judgement and concentration normal   Assessment & Plan:  Annual physical exam Annual exam as documented.  Immunization and cancer screening needs are specifically addressed at this visit.   Essential hypertension Elevated BP at visit, however pt significantly stressed at visit, tearful and sleep deprived due to acute grief following loss spouse and accompanying circumstances. Has upcoming appt with cardiology, will defer to cardiology to med adjust if needed  Grief Pt aware 2 days prior that her spouse who had been missing for 1 week is now dead, she is devastated  And still in disbelief, she is grieving the loss of the love of her life. Offer made of referral to telepsych, she has declined at this time and she does have excellent  Family support which she intends to rely on, will call if needed

## 2017-08-27 NOTE — Assessment & Plan Note (Signed)
Annual exam as documented. . Immunization and cancer screening needs are specifically addressed at this visit.  

## 2017-08-27 NOTE — Assessment & Plan Note (Addendum)
Pt aware 2 days prior that her spouse who had been missing for 1 week is now dead, she is devastated  And still in disbelief, she is grieving the loss of the love of her life. Offer made of referral to telepsych, she has declined at this time and she does have excellent  Family support which she intends to rely on, will call if needed

## 2017-08-31 LAB — CYTOLOGY - PAP
DIAGNOSIS: NEGATIVE
HPV (WINDOPATH): NOT DETECTED

## 2017-09-02 ENCOUNTER — Other Ambulatory Visit: Payer: Self-pay | Admitting: *Deleted

## 2017-09-02 MED ORDER — RIVAROXABAN 20 MG PO TABS: 20.0000 mg | ORAL_TABLET | Freq: Every day | ORAL | 6 refills | Status: DC

## 2017-09-07 NOTE — Progress Notes (Deleted)
Cardiology Office Note  Date: 09/07/2017   ID: Cindy Weiss, DOB 06-12-1960, MRN [id number]  PCP: Fayrene Helper, MD  Primary Cardiologist: Rozann Lesches, MD   No chief complaint on file.   History of Present Illness: Cindy Weiss is a 57 y.o. female last seen in February by Ms. Strader PA-C.  Past Medical History:  Diagnosis Date  . Anemia   . Fibroids   . Hyperlipidemia   . Hypertension   . PAF (paroxysmal atrial fibrillation) ([place])    a. diagnosed in 03/2017. Spontaneous conversion to NSR and started on Eliquis for anticoagulation    Past Surgical History:  Procedure Laterality Date  . BREAST EXCISIONAL BIOPSY Left    benign  . MYOMECTOMY      Current Outpatient Medications  Medication Sig Dispense Refill  . calcium carbonate (OS-CAL - DOSED IN MG OF ELEMENTAL CALCIUM) 1250 (500 Ca) MG tablet Take 2 tablets by mouth daily with breakfast.    . cetirizine (ZYRTEC ALLERGY) 10 MG tablet Take 1 tablet (10 mg total) by mouth daily. 10 tablet 0  . famotidine (PEPCID) 20 MG tablet Take 1 tablet (20 mg total) by mouth 2 (two) times daily for 3 days. 6 tablet 0  . fenofibrate 54 MG tablet TAKE 1 TABLET(54 MG) BY MOUTH DAILY 30 tablet 3  . metoprolol succinate (TOPROL XL) 25 MG 24 hr tablet Take 1 tablet (25 mg total) by mouth daily. 30 tablet 6  . Multiple Vitamins-Minerals (ALIVE WOMENS 50+ PO) Take 1 tablet by mouth daily.    . predniSONE (STERAPRED UNI-PAK 21 TAB) 10 MG (21) TBPK tablet Take 6 tabs (60mg ) on day 1, 5 tabs (50mg ) on day 2, 4 tabs (40mg ) on day 3, 3 tabs (30mg ) on day 4, 2 tabs (20mg ) on day 5, and 1 tab (10mg ) on day 6. 21 tablet 0  . rivaroxaban (XARELTO) 20 MG TABS tablet Take 1 tablet (20 mg total) by mouth daily with supper. 30 tablet 6   No current facility-administered medications for this visit.    Allergies:  Pravastatin   Social History: The patient  reports that she has never smoked. She has never used smokeless tobacco. She reports  that she does not drink alcohol or use drugs.   Family History: The patient's family history includes Hyperlipidemia in her mother; Hypertension in her sister; Thyroid disease in her father.   ROS:  Please see the history of present illness. Otherwise, complete review of systems is positive for {NONE DEFAULTED:18576::"none"}.  All other systems are reviewed and negative.   Physical Exam: VS:  There were no vitals taken for this visit., BMI There is no height or weight on file to calculate BMI.  Wt Readings from Last 3 Encounters:  08/26/17 170 lb (77.1 kg)  08/10/17 170 lb (77.1 kg)  06/22/17 172 lb 0.6 oz (78 kg)    General: Patient appears comfortable at rest. HEENT: Conjunctiva and lids normal, oropharynx clear with moist mucosa. Neck: Supple, no elevated JVP or carotid bruits, no thyromegaly. Lungs: Clear to auscultation, nonlabored breathing at rest. Cardiac: Regular rate and rhythm, no S3 or significant systolic murmur, no pericardial rub. Abdomen: Soft, nontender, no hepatomegaly, bowel sounds present, no guarding or rebound. Extremities: No pitting edema, distal pulses 2+. Skin: Warm and dry. Musculoskeletal: No kyphosis. Neuropsychiatric: Alert and oriented x3, affect grossly appropriate.  ECG: I personally reviewed the tracing from 05/06/2017 which showed sinus rhythm with incomplete right bundle branch block.  Recent  Labwork: 04/03/2017: TSH 0.86 04/09/2017: Hemoglobin 11.4; Platelets 300 08/24/2017: ALT 16; AST 14; BUN 10; Creat 0.75; Potassium 3.7; Sodium 140     Component Value Date/Time   CHOL 207 (H) 08/24/2017 1611   TRIG 47 08/24/2017 1611   HDL 79 08/24/2017 1611   CHOLHDL 2.6 08/24/2017 1611   VLDL 10 06/27/2016 0808   LDLCALC 114 (H) 08/24/2017 1611    Other Studies Reviewed Today:  Echocardiogram 04/08/2017: Study Conclusions  - Left ventricle: The cavity size was normal. Wall thickness was   normal. Systolic function was normal. The estimated ejection    fraction was in the range of 60% to 65%. Wall motion was normal;   there were no regional wall motion abnormalities. Left   ventricular diastolic function parameters were normal. - Mitral valve: There was mild regurgitation. - Right atrium: Central venous pressure (est): 3 mm Hg. - Atrial septum: No defect or patent foramen ovale was identified. - Tricuspid valve: There was trivial regurgitation. - Pulmonary arteries: Systolic pressure could not be accurately   estimated. - Pericardium, extracardiac: There was no pericardial effusion.  Impressions:  - Normal LV wall thickness with LVEF 60-65% and grossly normal   diastolic function. Mild mitral regurgitation. Trivial tricuspid   regurgitation.  Assessment and Plan:   Current medicines were reviewed with the patient today.  No orders of the defined types were placed in this encounter.   Disposition:  Signed, Satira Sark, MD, [facility] 09/07/2017 1:10 PM    [place] [facility]. [address] [place], [place] [address] Phone: [phone]; Fax: [phone]

## 2017-09-08 ENCOUNTER — Ambulatory Visit: Payer: Self-pay | Admitting: Cardiology

## 2017-09-28 NOTE — Progress Notes (Deleted)
Cardiology Office Note  Date: 09/28/2017   ID: Cindy Weiss, DOB 08-12-1960, MRN [id number]  PCP: Fayrene Helper, MD  Primary Cardiologist: Rozann Lesches, MD   No chief complaint on file.   History of Present Illness: Cindy Weiss is a 57 y.o. female last seen by Ms. Strader PA-C in February.  Since that encounter she has had changes in medical regimen including a switch from Eliquis to Xarelto and also discontinuation of calcium channel blocker given concerns about rash.  Past Medical History:  Diagnosis Date  . Anemia   . Fibroids   . Hyperlipidemia   . Hypertension   . PAF (paroxysmal atrial fibrillation) ([place])    a. diagnosed in 03/2017. Spontaneous conversion to NSR and started on Eliquis for anticoagulation    Past Surgical History:  Procedure Laterality Date  . BREAST EXCISIONAL BIOPSY Left    benign  . MYOMECTOMY      Current Outpatient Medications  Medication Sig Dispense Refill  . calcium carbonate (OS-CAL - DOSED IN MG OF ELEMENTAL CALCIUM) 1250 (500 Ca) MG tablet Take 2 tablets by mouth daily with breakfast.    . cetirizine (ZYRTEC ALLERGY) 10 MG tablet Take 1 tablet (10 mg total) by mouth daily. 10 tablet 0  . famotidine (PEPCID) 20 MG tablet Take 1 tablet (20 mg total) by mouth 2 (two) times daily for 3 days. 6 tablet 0  . fenofibrate 54 MG tablet TAKE 1 TABLET(54 MG) BY MOUTH DAILY 30 tablet 3  . metoprolol succinate (TOPROL XL) 25 MG 24 hr tablet Take 1 tablet (25 mg total) by mouth daily. 30 tablet 6  . Multiple Vitamins-Minerals (ALIVE WOMENS 50+ PO) Take 1 tablet by mouth daily.    . predniSONE (STERAPRED UNI-PAK 21 TAB) 10 MG (21) TBPK tablet Take 6 tabs (60mg ) on day 1, 5 tabs (50mg ) on day 2, 4 tabs (40mg ) on day 3, 3 tabs (30mg ) on day 4, 2 tabs (20mg ) on day 5, and 1 tab (10mg ) on day 6. 21 tablet 0  . rivaroxaban (XARELTO) 20 MG TABS tablet Take 1 tablet (20 mg total) by mouth daily with supper. 30 tablet 6   No current  facility-administered medications for this visit.    Allergies:  Pravastatin   Social History: The patient  reports that she has never smoked. She has never used smokeless tobacco. She reports that she does not drink alcohol or use drugs.   Family History: The patient's family history includes Hyperlipidemia in her mother; Hypertension in her sister; Thyroid disease in her father.   ROS:  Please see the history of present illness. Otherwise, complete review of systems is positive for {NONE DEFAULTED:18576::"none"}.  All other systems are reviewed and negative.   Physical Exam: VS:  There were no vitals taken for this visit., BMI There is no height or weight on file to calculate BMI.  Wt Readings from Last 3 Encounters:  08/26/17 170 lb (77.1 kg)  08/10/17 170 lb (77.1 kg)  06/22/17 172 lb 0.6 oz (78 kg)    General: Patient appears comfortable at rest. HEENT: Conjunctiva and lids normal, oropharynx clear with moist mucosa. Neck: Supple, no elevated JVP or carotid bruits, no thyromegaly. Lungs: Clear to auscultation, nonlabored breathing at rest. Cardiac: Regular rate and rhythm, no S3 or significant systolic murmur, no pericardial rub. Abdomen: Soft, nontender, no hepatomegaly, bowel sounds present, no guarding or rebound. Extremities: No pitting edema, distal pulses 2+. Skin: Warm and dry. Musculoskeletal: No kyphosis.  Neuropsychiatric: Alert and oriented x3, affect grossly appropriate.  ECG: I personally reviewed the tracing from 05/06/2017 with decreased R wave progression.  Recent Labwork: 04/03/2017: TSH 0.86 04/09/2017: Hemoglobin 11.4; Platelets 300 08/24/2017: ALT 16; AST 14; BUN 10; Creat 0.75; Potassium 3.7; Sodium 140     Component Value Date/Time   CHOL 207 (H) 08/24/2017 1611   TRIG 47 08/24/2017 1611   HDL 79 08/24/2017 1611   CHOLHDL 2.6 08/24/2017 1611   VLDL 10 06/27/2016 0808   LDLCALC 114 (H) 08/24/2017 1611    Other Studies Reviewed Today:  Echocardiogram  04/08/2017: Study Conclusions  - Left ventricle: The cavity size was normal. Wall thickness was   normal. Systolic function was normal. The estimated ejection   fraction was in the range of 60% to 65%. Wall motion was normal;   there were no regional wall motion abnormalities. Left   ventricular diastolic function parameters were normal. - Mitral valve: There was mild regurgitation. - Right atrium: Central venous pressure (est): 3 mm Hg. - Atrial septum: No defect or patent foramen ovale was identified. - Tricuspid valve: There was trivial regurgitation. - Pulmonary arteries: Systolic pressure could not be accurately   estimated. - Pericardium, extracardiac: There was no pericardial effusion.  Impressions:  - Normal LV wall thickness with LVEF 60-65% and grossly normal   diastolic function. Mild mitral regurgitation. Trivial tricuspid   regurgitation.  Assessment and Plan:   Current medicines were reviewed with the patient today.  No orders of the defined types were placed in this encounter.   Disposition:  Signed, Satira Sark, MD, [facility] 09/28/2017 8:41 AM    [facility]. [address] [place], [place] [address] Phone: [phone]; Fax: [phone]

## 2017-09-29 ENCOUNTER — Ambulatory Visit: Payer: Self-pay | Admitting: Cardiology

## 2017-10-05 NOTE — Progress Notes (Signed)
Cardiology Office Note  Date: 10/07/2017   ID: Cindy Weiss, DOB 11-30-1960, MRN [id number]  PCP: Fayrene Helper, MD  Primary Cardiologist: Rozann Lesches, MD   Chief Complaint  Patient presents with  . Atrial Fibrillation    History of Present Illness: Cindy Weiss is a 57 y.o. female last seen by Ms. Strader PA-C in February.  She has a history of paroxysmal atrial fibrillation diagnosed in January with spontaneous conversion to sinus rhythm.  Subsequent telephone notes reviewed.  Patient has had issues with a rash, was switched from Cardizem CD to Toprol-XL and then ultimately taken off of Eliquis with a switch to Xarelto.  Presents today stating that she has been able to take both Toprol-XL and Xarelto without obvious problems.  She has been grieving the loss of her husband who died suddenly about a month ago.  She reports occasional brief palpitations but nothing prolonged, no exertional chest pain.  She does states that she has seen some blood in her stools, is undergoing evaluation by Dr. Moshe Cipro with stool cards.  She has not had a recent CBC.  Past Medical History:  Diagnosis Date  . Anemia   . Fibroids   . Hyperlipidemia   . Hypertension   . PAF (paroxysmal atrial fibrillation) ([place])    a. diagnosed in 03/2017. Spontaneous conversion to NSR and started on Eliquis for anticoagulation    Past Surgical History:  Procedure Laterality Date  . BREAST EXCISIONAL BIOPSY Left    benign  . MYOMECTOMY      Current Outpatient Medications  Medication Sig Dispense Refill  . fenofibrate 54 MG tablet TAKE 1 TABLET(54 MG) BY MOUTH DAILY 30 tablet 3  . metoprolol succinate (TOPROL XL) 25 MG 24 hr tablet Take 1 tablet (25 mg total) by mouth daily. 30 tablet 6  . rivaroxaban (XARELTO) 20 MG TABS tablet Take 1 tablet (20 mg total) by mouth daily with supper. 30 tablet 6  . famotidine (PEPCID) 20 MG tablet Take 1 tablet (20 mg total) by mouth 2 (two) times daily for 3  days. 6 tablet 0   No current facility-administered medications for this visit.    Allergies:  Pravastatin   Social History: The patient  reports that she has never smoked. She has never used smokeless tobacco. She reports that she does not drink alcohol or use drugs.   ROS:  Please see the history of present illness. Otherwise, complete review of systems is positive for none.  All other systems are reviewed and negative.   Physical Exam: VS:  BP (!) 149/90   Pulse 81   Ht 5' 4.5" (1.638 m)   Wt 166 lb 6.4 oz (75.5 kg)   SpO2 96%   BMI 28.12 kg/m , BMI Body mass index is 28.12 kg/m.  Wt Readings from Last 3 Encounters:  10/07/17 166 lb 6.4 oz (75.5 kg)  08/26/17 170 lb (77.1 kg)  08/10/17 170 lb (77.1 kg)    General: Patient appears comfortable at rest. HEENT: Conjunctiva and lids normal, oropharynx clear. Neck: Supple, no elevated JVP or carotid bruits, no thyromegaly. Lungs: Clear to auscultation, nonlabored breathing at rest. Cardiac: Regular rate and rhythm, no S3 or significant systolic murmur, no pericardial rub. Abdomen: Soft, nontender, bowel sounds present. Extremities: No pitting edema, distal pulses 2+. Skin: Warm and dry. Musculoskeletal: No kyphosis. Neuropsychiatric: Alert and oriented x3, affect grossly appropriate.  ECG: I personally reviewed the tracing from 05/06/2017 which showed sinus rhythm with R'  in lead V2 and V3.  Recent Labwork: 04/03/2017: TSH 0.86 04/09/2017: Hemoglobin 11.4; Platelets 300 08/24/2017: ALT 16; AST 14; BUN 10; Creat 0.75; Potassium 3.7; Sodium 140     Component Value Date/Time   CHOL 207 (H) 08/24/2017 1611   TRIG 47 08/24/2017 1611   HDL 79 08/24/2017 1611   CHOLHDL 2.6 08/24/2017 1611   VLDL 10 06/27/2016 0808   LDLCALC 114 (H) 08/24/2017 1611    Other Studies Reviewed Today:  Echocardiogram 04/08/2017: Study Conclusions  - Left ventricle: The cavity size was normal. Wall thickness was   normal. Systolic function was  normal. The estimated ejection   fraction was in the range of 60% to 65%. Wall motion was normal;   there were no regional wall motion abnormalities. Left   ventricular diastolic function parameters were normal. - Mitral valve: There was mild regurgitation. - Right atrium: Central venous pressure (est): 3 mm Hg. - Atrial septum: No defect or patent foramen ovale was identified. - Tricuspid valve: There was trivial regurgitation. - Pulmonary arteries: Systolic pressure could not be accurately   estimated. - Pericardium, extracardiac: There was no pericardial effusion.  Impressions:  - Normal LV wall thickness with LVEF 60-65% and grossly normal   diastolic function. Mild mitral regurgitation. Trivial tricuspid   regurgitation.  Assessment and Plan:  1.  Paroxysmal atrial fibrillation with CHADSVASC score of 2.  Heart rate is regular today and she reports no progressive or prolonged palpitations.  She is tolerating Toprol-XL and Xarelto at this time with previous intolerances and rash on Cardizem CD and Eliquis.  Plan is to obtain a CBC, I reviewed the recent CMET.  She does report some blood in her stools and has had stool cards given by Dr. Moshe Cipro.  She may need further GI evaluation.  2.  Essential hypertension by history.  She is following with Dr. Moshe Cipro.  3.  History of hyperlipidemia, on fenofibrate per Dr. Moshe Cipro.  Current medicines were reviewed with the patient today.   Orders Placed This Encounter  Procedures  . CBC    Disposition: Follow-up in 3 months in the [place] office.  Signed, Satira Sark, MD, [facility] 10/07/2017 3:40 PM    [place] at [place], [place], [place] [address] Phone: [phone]; Fax: [phone]

## 2017-10-06 DIAGNOSIS — Z1211 Encounter for screening for malignant neoplasm of colon: Secondary | ICD-10-CM

## 2017-10-06 LAB — HEMOCCULT GUIAC POC 1CARD (OFFICE)
FECAL OCCULT BLD: NEGATIVE
FECAL OCCULT BLD: NEGATIVE
Fecal Occult Blood, POC: NEGATIVE

## 2017-10-07 ENCOUNTER — Encounter: Payer: Self-pay | Admitting: Cardiology

## 2017-10-07 ENCOUNTER — Ambulatory Visit (INDEPENDENT_AMBULATORY_CARE_PROVIDER_SITE_OTHER): Payer: Self-pay | Admitting: Cardiology

## 2017-10-07 VITALS — BP 149/90 | HR 81 | Ht 64.5 in | Wt 166.4 lb

## 2017-10-07 DIAGNOSIS — E782 Mixed hyperlipidemia: Secondary | ICD-10-CM

## 2017-10-07 DIAGNOSIS — I480 Paroxysmal atrial fibrillation: Secondary | ICD-10-CM

## 2017-10-07 DIAGNOSIS — I10 Essential (primary) hypertension: Secondary | ICD-10-CM

## 2017-10-07 NOTE — Patient Instructions (Signed)
Medication Instructions:  Your physician recommends that you continue on your current medications as directed. Please refer to the Current Medication list given to you today.  Labwork: CBC Orders given today  Testing/Procedures: NONE  Follow-Up: Your physician recommends that you schedule a follow-up appointment in: 3 MONTHS WITH STRADER   Any Other Special Instructions Will Be Listed Below (If Applicable).  If you need a refill on your cardiac medications before your next appointment, please call your pharmacy.

## 2017-10-24 ENCOUNTER — Telehealth: Payer: Self-pay | Admitting: Family Medicine

## 2017-10-24 NOTE — Telephone Encounter (Signed)
Message left on this number trying to reach patient who has asked for referral to therapist for counseling for depression through telepsychiatry . She is grieving over the recent loss of her spouse and is very depressed . I had hoped to formally enter a PHQ 9 score before referring however have been unable to contact her on th phone to do so, I have left her a message to call back or call office in am The PHQ 9 score can be done by nurse on telephone , so I will forward this note asking Brandi to conduct tele interview in case pt does not call back tonight, I saw her earlier today and told her that I would contact her around this time this evening

## 2017-11-17 ENCOUNTER — Telehealth: Payer: Self-pay | Admitting: *Deleted

## 2017-11-17 NOTE — Telephone Encounter (Signed)
Called pt no answer °

## 2017-11-17 NOTE — Telephone Encounter (Signed)
Pt walked into office requesting help with Xarelto patient assistance. Pt is also requesting samples. Given 2 weeks of Xarelto samples.

## 2017-12-01 ENCOUNTER — Telehealth: Payer: Self-pay | Admitting: Cardiology

## 2017-12-01 NOTE — Telephone Encounter (Signed)
Pt has not heard anything back from her Xarelto assistance and is taking her last pill today. Would like to know if she can get some more samples

## 2017-12-02 NOTE — Telephone Encounter (Signed)
5 bottles #7 each Xarelto  Lot [id number]  Exp 4/21   [id number]

## 2017-12-03 NOTE — Telephone Encounter (Signed)
Called no answer

## 2017-12-08 ENCOUNTER — Telehealth: Payer: Self-pay | Admitting: *Deleted

## 2017-12-08 NOTE — Telephone Encounter (Signed)
Called to check on the status the [facility] patient assistance. Pt did not complete financial information of application but can complete over the phone. Called patient to notify. No answer. Left msg to call back.

## 2017-12-20 ENCOUNTER — Ambulatory Visit: Payer: Self-pay | Admitting: Cardiology

## 2017-12-20 ENCOUNTER — Encounter

## 2017-12-26 ENCOUNTER — Other Ambulatory Visit: Payer: Self-pay | Admitting: Family Medicine

## 2017-12-30 ENCOUNTER — Encounter: Payer: Self-pay | Admitting: Family Medicine

## 2017-12-30 ENCOUNTER — Ambulatory Visit (INDEPENDENT_AMBULATORY_CARE_PROVIDER_SITE_OTHER): Payer: Self-pay | Admitting: Family Medicine

## 2017-12-30 ENCOUNTER — Other Ambulatory Visit: Payer: Self-pay

## 2017-12-30 VITALS — BP 128/76 | HR 84 | Resp 12 | Ht 64.5 in | Wt 168.0 lb

## 2017-12-30 DIAGNOSIS — E7849 Other hyperlipidemia: Secondary | ICD-10-CM

## 2017-12-30 DIAGNOSIS — I10 Essential (primary) hypertension: Secondary | ICD-10-CM

## 2017-12-30 DIAGNOSIS — Z23 Encounter for immunization: Secondary | ICD-10-CM

## 2017-12-30 DIAGNOSIS — I4891 Unspecified atrial fibrillation: Secondary | ICD-10-CM

## 2017-12-30 DIAGNOSIS — F4321 Adjustment disorder with depressed mood: Secondary | ICD-10-CM

## 2017-12-30 NOTE — Progress Notes (Signed)
   Cindy Weiss     MRN: [id number]      DOB: May 16, 1960   HPI Cindy Weiss is here for follow up and re-evaluation of chronic medical conditions, medication management and review of any available recent lab and radiology data.  Preventive health is updated, specifically  Cancer screening and Immunization.   There are no specific complaints , slowly gettting over her grief at the unexpected loss of her spouse with chronic illness, also states that in laws blame her, however she does have excellent family support and did everything she could for her spouse to attend to his health. Still struggling with the reality of his passing  ROS Denies recent fever or chills. Denies sinus pressure, nasal congestion, ear pain or sore throat. Denies chest congestion, productive cough or wheezing. Denies chest pains, palpitations and leg swelling Denies abdominal pain, nausea, vomiting,diarrhea or constipation.   Denies dysuria, frequency, hesitancy or incontinence. Denies joint pain, swelling and limitation in mobility. Denies headaches, seizures, numbness, or tingling. Denies uncontrolled  depression, not suicidal or homicidal , but stoill grieving, denies anxiety or insomnia. Denies skin break down or rash.   PE  BP 128/76 (BP Location: Left Arm, Patient Position: Sitting, Cuff Size: Large)   Pulse 84   Resp 12   Ht 5' 4.5" (1.638 m)   Wt 168 lb (76.2 kg)   SpO2 98%   BMI 28.39 kg/m   Patient alert and oriented and in no cardiopulmonary distress.  HEENT: No facial asymmetry, EOMI,   oropharynx pink and moist.  Neck supple no JVD, no mass.  Chest: Clear to auscultation bilaterally.  CVS: S1, S2 no murmurs, no S3.IrRegular rate.  ABD: Soft non tender.   Ext: No edema  MS: Adequate ROM spine, shoulders, hips and knees.  Skin: Intact, no ulcerations or rash noted.  Psych: Good eye contact, sad  affect. Memory intact not anxious or depressed appearing.  CNS: CN 2-12 intact, power,   normal throughout.no focal deficits noted.   Assessment & Plan  HTN (hypertension) Controlled, no change in medication DASH diet and commitment to daily physical activity for a minimum of 30 minutes discussed and encouraged, as a part of hypertension management. The importance of attaining a healthy weight is also discussed.  BP/Weight 12/30/2017 10/07/2017 08/26/2017 08/10/2017 06/22/2017 05/06/2017 0/11/2328  Systolic BP 076 226 333 545 625 638 937  Diastolic BP 76 90 90 95 84 86 83  Wt. (Lbs) 168 166.4 170 170 172.04 174 -  BMI 28.39 28.12 29.18 28.29 29.53 28.96 -       Hyperlipidemia Hyperlipidemia:Low fat diet discussed and encouraged.   Lipid Panel  Lab Results  Component Value Date   CHOL 207 (H) 08/24/2017   HDL 79 08/24/2017   LDLCALC 114 (H) 08/24/2017   TRIG 47 08/24/2017   CHOLHDL 2.6 08/24/2017   Not at goal. Updated lab needed at/ before next visit.     Grief Gradually lessening though still in shock, unable to fathom / accept his physical death, but much improved, has excellent famil support, no medication management indicated at this time  Atrial fibrillation with RVR ([place]) Rate currently well controlled, maintained on anticoagullant and followed by crdiology

## 2017-12-30 NOTE — Patient Instructions (Addendum)
F/U in 6 months, call if you need me before  Flu vaccine today   Good that you are improving, no medication changes

## 2018-01-01 ENCOUNTER — Encounter: Payer: Self-pay | Admitting: Family Medicine

## 2018-01-01 NOTE — Assessment & Plan Note (Signed)
Gradually lessening though still in shock, unable to fathom / accept his physical death, but much improved, has excellent famil support, no medication management indicated at this time

## 2018-01-01 NOTE — Assessment & Plan Note (Signed)
Hyperlipidemia:Low fat diet discussed and encouraged.   Lipid Panel  Lab Results  Component Value Date   CHOL 207 (H) 08/24/2017   HDL 79 08/24/2017   LDLCALC 114 (H) 08/24/2017   TRIG 47 08/24/2017   CHOLHDL 2.6 08/24/2017   Not at goal. Updated lab needed at/ before next visit.

## 2018-01-01 NOTE — Assessment & Plan Note (Signed)
Rate currently well controlled, maintained on anticoagullant and followed by crdiology

## 2018-01-01 NOTE — Assessment & Plan Note (Signed)
Controlled, no change in medication DASH diet and commitment to daily physical activity for a minimum of 30 minutes discussed and encouraged, as a part of hypertension management. The importance of attaining a healthy weight is also discussed.  BP/Weight 12/30/2017 10/07/2017 08/26/2017 08/10/2017 06/22/2017 05/06/2017 0/30/1314  Systolic BP 388 875 797 282 060 156 153  Diastolic BP 76 90 90 95 84 86 83  Wt. (Lbs) 168 166.4 170 170 172.04 174 -  BMI 28.39 28.12 29.18 28.29 29.53 28.96 -

## 2018-01-12 ENCOUNTER — Ambulatory Visit (INDEPENDENT_AMBULATORY_CARE_PROVIDER_SITE_OTHER): Payer: Self-pay | Admitting: Student

## 2018-01-12 ENCOUNTER — Encounter: Payer: Self-pay | Admitting: Student

## 2018-01-12 ENCOUNTER — Other Ambulatory Visit (HOSPITAL_COMMUNITY)
Admission: RE | Admit: 2018-01-12 | Discharge: 2018-01-12 | Disposition: A | Discharge status: Home / Self Care | Payer: Self-pay | Source: Ambulatory Visit | Attending: Cardiology | Admitting: Cardiology

## 2018-01-12 VITALS — BP 128/62 | HR 71 | Ht 64.5 in | Wt 165.0 lb

## 2018-01-12 DIAGNOSIS — I480 Paroxysmal atrial fibrillation: Secondary | ICD-10-CM | POA: Insufficient documentation

## 2018-01-12 DIAGNOSIS — I10 Essential (primary) hypertension: Secondary | ICD-10-CM

## 2018-01-12 DIAGNOSIS — Z7901 Long term (current) use of anticoagulants: Secondary | ICD-10-CM

## 2018-01-12 DIAGNOSIS — E782 Mixed hyperlipidemia: Secondary | ICD-10-CM

## 2018-01-12 LAB — CBC
HEMATOCRIT: 37.9 % (ref 36.0–46.0)
Hemoglobin: 11.8 g/dL — ABNORMAL LOW (ref 12.0–15.0)
MCH: 21.2 pg — ABNORMAL LOW (ref 26.0–34.0)
MCHC: 31.1 g/dL (ref 30.0–36.0)
MCV: 68.2 fL — ABNORMAL LOW (ref 80.0–100.0)
NRBC: 0 % (ref 0.0–0.2)
Platelets: 302 10*3/uL (ref 150–400)
RBC: 5.56 MIL/uL — ABNORMAL HIGH (ref 3.87–5.11)
RDW: 14.6 % (ref 11.5–15.5)
WBC: 7.3 10*3/uL (ref 4.0–10.5)

## 2018-01-12 NOTE — Progress Notes (Signed)
Cardiology Office Note    Date:  01/12/2018   ID:  Cindy Weiss, DOB [birth year], MRN [id number]  PCP:  Fayrene Helper, MD  Cardiologist: Rozann Lesches, MD    Chief Complaint  Patient presents with  . Follow-up    3 month visit    History of Present Illness:    Cindy Weiss is a 57 y.o. female with past medical history of paroxysmal atrial fibrillation (on Xarelto for anticoagulation), HTN, and HLD who presents to the office today for 99-month follow-up.   She was last examined by Dr. Domenic Polite in 09/2017 and reported tolerating both Toprol-XL and Xarelto at that time. She was previously on Eliquis for anticoagulation but developed a rash with this, and was therefore transitioned to Xarelto. At the time of her visit, she reported her husband died unexpectedly a month prior and she had been having occasional palpitations since. Denied any associated chest pain or dyspnea. She did report occasional blood in her stool and this was being evaluated by her PCP. By review of records, occult blood samples were negative at that time.  In talking with the patient today, she reports overall doing well from a cardiac perspective since her last office visit. She denies any recent palpitations, chest discomfort, dyspnea on exertion, orthopnea, PND, or lower extremity edema. Does report occasional "streaks of blood" on the toilet paper after having a bowel movement but no significant melena or hematochezia.   She is still grieving from the unexpected loss of her husband earlier this year and says that she receives a lot of emotional support from her family which she is grateful for.   Past Medical History:  Diagnosis Date  . Anemia   . Fibroids   . Hyperlipidemia   . Hypertension   . PAF (paroxysmal atrial fibrillation) ([place])    a. diagnosed in 03/2017. Spontaneous conversion to NSR     Past Surgical History:  Procedure Laterality Date  . BREAST EXCISIONAL BIOPSY Left    benign    . MYOMECTOMY      Current Medications: Outpatient Medications Prior to Visit  Medication Sig Dispense Refill  . fenofibrate 54 MG tablet TAKE 1 TABLET(54 MG) BY MOUTH DAILY 30 tablet 3  . metoprolol succinate (TOPROL XL) 25 MG 24 hr tablet Take 1 tablet (25 mg total) by mouth daily. 30 tablet 6  . rivaroxaban (XARELTO) 20 MG TABS tablet Take 1 tablet (20 mg total) by mouth daily with supper. 30 tablet 6   No facility-administered medications prior to visit.      Allergies:   Pravastatin and Eliquis [apixaban]   Social History   Socioeconomic History  . Marital status: Married    Spouse name: Not on file  . Number of children: Not on file  . Years of education: Not on file  . Highest education level: Not on file  Occupational History  . Not on file  Social Needs  . Financial resource strain: Not on file  . Food insecurity:    Worry: Not on file    Inability: Not on file  . Transportation needs:    Medical: Not on file    Non-medical: Not on file  Tobacco Use  . Smoking status: Never Smoker  . Smokeless tobacco: Never Used  Substance and Sexual Activity  . Alcohol use: No  . Drug use: No  . Sexual activity: Not on file  Lifestyle  . Physical activity:    Days per week: Not on  file    Minutes per session: Not on file  . Stress: Not on file  Relationships  . Social connections:    Talks on phone: Not on file    Gets together: Not on file    Attends religious service: Not on file    Active member of club or organization: Not on file    Attends meetings of clubs or organizations: Not on file    Relationship status: Not on file  Other Topics Concern  . Not on file  Social History Narrative  . Not on file     Family History:  The patient's family history includes Hyperlipidemia in her mother; Hypertension in her sister; Thyroid disease in her father.   Review of Systems:   Please see the history of present illness.     General:  No chills, fever, night sweats  or weight changes.  Cardiovascular:  No chest pain, dyspnea on exertion, edema, orthopnea, palpitations, paroxysmal nocturnal dyspnea. Dermatological: No rash, lesions/masses Respiratory: No cough, dyspnea Urologic: No hematuria, dysuria Abdominal:   No nausea, vomiting, diarrhea,  melena, or hematemesis Neurologic:  No visual changes, wkns, changes in mental status.  She denies any of the above symptoms.   All other systems reviewed and are otherwise negative except as noted above.   Physical Exam:    VS:  BP 128/62   Pulse 71   Ht 5' 4.5" (1.638 m)   Wt 165 lb (74.8 kg)   SpO2 98%   BMI 27.88 kg/m    General: Well developed, well nourished [place] American female appearing in no acute distress. Head: Normocephalic, atraumatic, sclera non-icteric, no xanthomas, nares are without discharge.  Neck: No carotid bruits. JVD not elevated.  Lungs: Respirations regular and unlabored, without wheezes or rales.  Heart: Regular rate and rhythm. No S3 or S4.  No murmur, no rubs, or gallops appreciated. Abdomen: Soft, non-tender, non-distended with normoactive bowel sounds. No hepatomegaly. No rebound/guarding. No obvious abdominal masses. Msk:  Strength and tone appear normal for age. No joint deformities or effusions. Extremities: No clubbing or cyanosis. No lower extremity edema.  Distal pedal pulses are 2+ bilaterally. Neuro: Alert and oriented X 3. Moves all extremities spontaneously. No focal deficits noted. Psych:  Responds to questions appropriately with a normal affect. Skin: No rashes or lesions noted  Wt Readings from Last 3 Encounters:  01/12/18 165 lb (74.8 kg)  12/30/17 168 lb (76.2 kg)  10/07/17 166 lb 6.4 oz (75.5 kg)     Studies/Labs Reviewed:   EKG:  EKG is not ordered today.    Recent Labs: 04/03/2017: TSH 0.86 08/24/2017: ALT 16; BUN 10; Creat 0.75; Potassium 3.7; Sodium 140 01/12/2018: Hemoglobin 11.8; Platelets 302   Lipid Panel    Component Value Date/Time     CHOL 207 (H) 08/24/2017 1611   TRIG 47 08/24/2017 1611   HDL 79 08/24/2017 1611   CHOLHDL 2.6 08/24/2017 1611   VLDL 10 06/27/2016 0808   LDLCALC 114 (H) 08/24/2017 1611    Additional studies/ records that were reviewed today include:   Echocardiogram: 03/2017 Study Conclusions  - Left ventricle: The cavity size was normal. Wall thickness was   normal. Systolic function was normal. The estimated ejection   fraction was in the range of 60% to 65%. Wall motion was normal;   there were no regional wall motion abnormalities. Left   ventricular diastolic function parameters were normal. - Mitral valve: There was mild regurgitation. - Right atrium: Central venous pressure (  est): 3 mm Hg. - Atrial septum: No defect or patent foramen ovale was identified. - Tricuspid valve: There was trivial regurgitation. - Pulmonary arteries: Systolic pressure could not be accurately   estimated. - Pericardium, extracardiac: There was no pericardial effusion.  Impressions:  - Normal LV wall thickness with LVEF 60-65% and grossly normal   diastolic function. Mild mitral regurgitation. Trivial tricuspid   regurgitation.  Assessment:    1. Paroxysmal atrial fibrillation (HCC)   2. Current use of long term anticoagulation   3. Essential hypertension   4. Mixed hyperlipidemia      Plan:   In order of problems listed above:  1. Paroxysmal Atrial Fibrillation/ Use of Long-Term Anticoagulation - She denies any recent palpitations and is maintaining normal sinus rhythm by examination today. Heart rate is well-controlled in the 70's. Continue Toprol-XL 25 mg daily for rate control.  - She does report occasional "streaks of blood" on the toilet paper after having a bowel movement but no significant melena or hematochezia. Recent occult blood samples were obtained by her PCP and negative.  Repeat CBC was obtained today and showed that Hgb was overall stable at 11.8. Patient reports having a known  anemia dating back to childhood and I informed her to follow-up with her PCP in regards to the need for further testing. Overall, her Hgb has been stable as this was 11.5 in 03/2017. Will continue with Xarelto 20 mg daily at this time.  2. HTN - BP is well controlled at 128/62 during today's visit. - Continue Toprol-XL 25 mg daily.  3. HLD - Followed by her PCP. Previously intolerant to statin therapy. Remains on Fenofibrate 54 mg daily.   Medication Adjustments/Labs and Tests Ordered: Current medicines are reviewed at length with the patient today.  Concerns regarding medicines are outlined above.  Medication changes, Labs and Tests ordered today are listed in the Patient Instructions below. Patient Instructions   Medication Instructions:  Your physician recommends that you continue on your current medications as directed. Please refer to the Current Medication list given to you today.  If you need a refill on your cardiac medications before your next appointment, please call your pharmacy.   Lab work: NONE  If you have labs (blood work) drawn today and your tests are completely normal, you will receive your results only by: Marland Kitchen MyChart Message (if you have MyChart) OR . A paper copy in the mail If you have any lab test that is abnormal or we need to change your treatment, we will call you to review the results.  Testing/Procedures: NONE   Follow-Up: At [facility], you and your health needs are our priority.  As part of our continuing mission to provide you with exceptional heart care, we have created designated Provider Care Teams.  These Care Teams include your primary Cardiologist (physician) and Advanced Practice Providers (APPs -  Physician Assistants and Nurse Practitioners) who all work together to provide you with the care you need, when you need it. You will need a follow up appointment in 6 months.  Please call our office 2 months in advance to schedule this appointment.   You may see Rozann Lesches, MD or one of the following Advanced Practice Providers on your designated Care Team:   Bernerd Pho, PA-C [facility]) . Ermalinda Barrios, PA-C ([place])  Any Other Special Instructions Will Be Listed Below (If Applicable). Thank you for choosing [facility]!        Signed, [place]  Wynelle Fanny, PA-C  01/12/2018 5:39 PM    [place]. [address] [place], [place] [address] Phone: [phone]

## 2018-01-12 NOTE — Patient Instructions (Addendum)
Iron-Rich Diet Iron is a mineral that helps your body to produce hemoglobin. Hemoglobin is a protein in your red blood cells that carries oxygen to your body's tissues. Eating too little iron may cause you to feel weak and tired, and it can increase your risk for infection. Eating enough iron is necessary for your body's metabolism, muscle function, and nervous system. Iron is naturally found in many foods. It can also be added to foods or fortified in foods. There are two types of dietary iron:  Heme iron. Heme iron is absorbed by the body more easily than nonheme iron. Heme iron is found in meat, poultry, and fish.  Nonheme iron. Nonheme iron is found in dietary supplements, iron-fortified grains, beans, and vegetables.  You may need to follow an iron-rich diet if:  You have been diagnosed with iron deficiency or iron-deficiency anemia.  You have a condition that prevents you from absorbing dietary iron, such as: ? Infection in your intestines. ? Celiac disease. This involves long-lasting (chronic) inflammation of your intestines.  You do not eat enough iron.  You eat a diet that is high in foods that impair iron absorption.  You have lost a lot of blood.  You have heavy bleeding during your menstrual cycle.  You are pregnant.  What is my plan? Your health care provider may help you to determine how much iron you need per day based on your condition. Generally, when a person consumes sufficient amounts of iron in the diet, the following iron needs are met:  Men. ? 14-18 years old: 11 mg per day. ? 19-50 years old: 8 mg per day.  Women. ? 14-18 years old: 15 mg per day. ? 19-50 years old: 18 mg per day. ? Over 50 years old: 8 mg per day. ? Pregnant women: 27 mg per day. ? Breastfeeding women: 9 mg per day.  What do I need to know about an iron-rich diet?  Eat fresh fruits and vegetables that are high in vitamin C along with foods that are high in iron. This will help  increase the amount of iron that your body absorbs from food, especially with foods containing nonheme iron. Foods that are high in vitamin C include oranges, peppers, tomatoes, and mango.  Take iron supplements only as directed by your health care provider. Overdose of iron can be life-threatening. If you were prescribed iron supplements, take them with orange juice or a vitamin C supplement.  Cook foods in pots and pans that are made from iron.  Eat nonheme iron-containing foods alongside foods that are high in heme iron. This helps to improve your iron absorption.  Certain foods and drinks contain compounds that impair iron absorption. Avoid eating these foods in the same meal as iron-rich foods or with iron supplements. These include: ? Coffee, black tea, and red wine. ? Milk, dairy products, and foods that are high in calcium. ? Beans, soybeans, and peas. ? Whole grains.  When eating foods that contain both nonheme iron and compounds that impair iron absorption, follow these tips to absorb iron better. ? Soak beans overnight before cooking. ? Soak whole grains overnight and drain them before using. ? Ferment flours before baking, such as using yeast in bread dough. What foods can I eat? Grains Iron-fortified breakfast cereal. Iron-fortified whole-wheat bread. Enriched rice. Sprouted grains. Vegetables Spinach. Potatoes with skin. Green peas. Broccoli. Red and green bell peppers. Fermented vegetables. Fruits Prunes. Raisins. Oranges. Strawberries. Mango. Grapefruit. Meats and Other Protein Sources   Beef liver. Oysters. Beef. Shrimp. [place]. Chicken. [place]. Sardines. Chickpeas. Nuts. Tofu. Beverages Tomato juice. Fresh orange juice. Prune juice. Hibiscus tea. Fortified instant breakfast shakes. Condiments Tahini. Fermented soy sauce. Sweets and Desserts Black-strap molasses. Other Wheat germ. The items listed above may not be a complete list of recommended foods or beverages.  Contact your dietitian for more options. What foods are not recommended? Grains Whole grains. Bran cereal. Bran flour. Oats. Vegetables Artichokes. Brussels sprouts. Kale. Fruits Blueberries. Raspberries. Strawberries. Figs. Meats and Other Protein Sources Soybeans. Products made from soy protein. Dairy Milk. Cream. Cheese. Yogurt. Cottage cheese. Beverages Coffee. Black tea. Red wine. Sweets and Desserts Cocoa. Chocolate. Ice cream. Other Basil. Oregano. Parsley. The items listed above may not be a complete list of foods and beverages to avoid. Contact your dietitian for more information. This information is not intended to replace advice given to you by your health care provider. Make sure you discuss any questions you have with your health care provider. Document Released: 10/28/2004 Document Revised: 10/04/2015 Document Reviewed: 10/11/2013 Elsevier Interactive Patient Education  2018 [employer]. Medication Instructions:  Your physician recommends that you continue on your current medications as directed. Please refer to the Current Medication list given to you today.  If you need a refill on your cardiac medications before your next appointment, please call your pharmacy.   Lab work: NONE  If you have labs (blood work) drawn today and your tests are completely normal, you will receive your results only by: Marland Kitchen MyChart Message (if you have MyChart) OR . A paper copy in the mail If you have any lab test that is abnormal or we need to change your treatment, we will call you to review the results.  Testing/Procedures: NONE   Follow-Up: At [facility], you and your health needs are our priority.  As part of our continuing mission to provide you with exceptional heart care, we have created designated Provider Care Teams.  These Care Teams include your primary Cardiologist (physician) and Advanced Practice Providers (APPs -  Physician Assistants and Nurse Practitioners) who  all work together to provide you with the care you need, when you need it. You will need a follow up appointment in 6 months.  Please call our office 2 months in advance to schedule this appointment.  You may see Rozann Lesches, MD or one of the following Advanced Practice Providers on your designated Care Team:   Bernerd Pho, PA-C [facility]) . Ermalinda Barrios, PA-C ([place])  Any Other Special Instructions Will Be Listed Below (If Applicable). Thank you for choosing [facility]!

## 2018-01-24 ENCOUNTER — Telehealth: Payer: Self-pay | Admitting: *Deleted

## 2018-01-24 NOTE — Telephone Encounter (Signed)
Called to check the status of patient assistance for Xarelto. Spoke with Lucky Rathke who states page 4 needed to be re completed and re faxed to [employer].

## 2018-02-01 ENCOUNTER — Telehealth: Payer: Self-pay | Admitting: *Deleted

## 2018-02-01 NOTE — Telephone Encounter (Signed)
Pt has been approved for [employer] patient assistance for up to one year.

## 2018-03-02 ENCOUNTER — Other Ambulatory Visit: Payer: Self-pay | Admitting: Student

## 2018-03-02 MED ORDER — METOPROLOL SUCCINATE ER 25 MG PO TB24: 25.0000 mg | ORAL_TABLET | Freq: Every day | ORAL | 1 refills | Status: DC

## 2018-03-02 NOTE — Telephone Encounter (Signed)
Needing refill for metoprolol succinate (TOPROL XL) 25 MG 24 hr tablet [[id number]]  Sent to [facility] on [address]   Pt states she's been w/o for 4 days

## 2018-04-11 ENCOUNTER — Encounter: Payer: Self-pay | Admitting: *Deleted

## 2018-05-07 ENCOUNTER — Other Ambulatory Visit: Payer: Self-pay | Admitting: Family Medicine

## 2018-05-09 ENCOUNTER — Other Ambulatory Visit: Payer: Self-pay

## 2018-05-09 ENCOUNTER — Telehealth: Payer: Self-pay | Admitting: *Deleted

## 2018-05-09 MED ORDER — FENOFIBRATE 54 MG PO TABS: ORAL_TABLET | ORAL | 3 refills | Status: DC

## 2018-05-09 NOTE — Telephone Encounter (Signed)
Pt called needing a refill on her fenofibrate 54 mg. She is completely out.

## 2018-07-04 ENCOUNTER — Telehealth: Payer: Self-pay | Admitting: *Deleted

## 2018-07-04 NOTE — Telephone Encounter (Signed)
eerror

## 2018-07-05 ENCOUNTER — Ambulatory Visit: Payer: Self-pay | Admitting: Family Medicine

## 2018-07-11 ENCOUNTER — Ambulatory Visit: Payer: Self-pay | Admitting: Family Medicine

## 2018-07-15 ENCOUNTER — Telehealth: Payer: Self-pay

## 2018-07-15 DIAGNOSIS — E7849 Other hyperlipidemia: Secondary | ICD-10-CM

## 2018-07-15 DIAGNOSIS — I10 Essential (primary) hypertension: Secondary | ICD-10-CM

## 2018-07-15 NOTE — Telephone Encounter (Signed)
Labs re-ordered

## 2018-07-23 LAB — CBC
HCT: 36.4 % (ref 35.0–45.0)
Hemoglobin: 11.8 g/dL (ref 11.7–15.5)
MCH: 22.3 pg — ABNORMAL LOW (ref 27.0–33.0)
MCHC: 32.4 g/dL (ref 32.0–36.0)
MCV: 68.9 fL — ABNORMAL LOW (ref 80.0–100.0)
MPV: 10.4 fL (ref 7.5–12.5)
Platelets: 307 10*3/uL (ref 140–400)
RBC: 5.28 10*6/uL — ABNORMAL HIGH (ref 3.80–5.10)
RDW: 15.2 % — ABNORMAL HIGH (ref 11.0–15.0)
WBC: 5.5 10*3/uL (ref 3.8–10.8)

## 2018-07-23 LAB — LIPID PANEL
Cholesterol: 218 mg/dL — ABNORMAL HIGH (ref <200)
HDL: 83 mg/dL (ref >=50)
LDL Cholesterol (Calc): 122 mg/dL (calc) — ABNORMAL HIGH
Non-HDL Cholesterol (Calc): 135 mg/dL (calc) — ABNORMAL HIGH (ref <130)
Total CHOL/HDL Ratio: 2.6 (calc) (ref <5.0)
Triglycerides: 43 mg/dL (ref <150)

## 2018-07-23 LAB — COMPLETE METABOLIC PANEL WITH GFR
AG Ratio: 1.5 (calc) (ref 1.0–2.5)
ALT: 17 U/L (ref 6–29)
AST: 18 U/L (ref 10–35)
Albumin: 4.4 g/dL (ref 3.6–5.1)
Alkaline phosphatase (APISO): 59 U/L (ref 37–153)
BUN: 16 mg/dL (ref 7–25)
CO2: 29 mmol/L (ref 20–32)
Calcium: 9.9 mg/dL (ref 8.6–10.4)
Chloride: 103 mmol/L (ref 98–110)
Creat: 0.82 mg/dL (ref 0.50–1.05)
GFR, Est African American: 91 mL/min/{1.73_m2} (ref >=60)
GFR, Est Non African American: 79 mL/min/{1.73_m2} (ref >=60)
Globulin: 3 g/dL (calc) (ref 1.9–3.7)
Glucose, Bld: 96 mg/dL (ref 65–99)
Potassium: 4.5 mmol/L (ref 3.5–5.3)
Sodium: 139 mmol/L (ref 135–146)
Total Bilirubin: 0.5 mg/dL (ref 0.2–1.2)
Total Protein: 7.4 g/dL (ref 6.1–8.1)

## 2018-07-26 ENCOUNTER — Encounter: Payer: Self-pay | Admitting: Family Medicine

## 2018-07-26 ENCOUNTER — Other Ambulatory Visit: Payer: Self-pay

## 2018-07-26 ENCOUNTER — Ambulatory Visit (INDEPENDENT_AMBULATORY_CARE_PROVIDER_SITE_OTHER): Payer: Self-pay | Admitting: Family Medicine

## 2018-07-26 VITALS — BP 128/62 | Ht 64.5 in | Wt 165.0 lb

## 2018-07-26 DIAGNOSIS — I10 Essential (primary) hypertension: Secondary | ICD-10-CM

## 2018-07-26 DIAGNOSIS — E663 Overweight: Secondary | ICD-10-CM

## 2018-07-26 DIAGNOSIS — F4321 Adjustment disorder with depressed mood: Secondary | ICD-10-CM

## 2018-07-26 DIAGNOSIS — I4891 Unspecified atrial fibrillation: Secondary | ICD-10-CM

## 2018-07-26 DIAGNOSIS — Z1231 Encounter for screening mammogram for malignant neoplasm of breast: Secondary | ICD-10-CM

## 2018-07-26 DIAGNOSIS — E7849 Other hyperlipidemia: Secondary | ICD-10-CM

## 2018-07-26 MED ORDER — FENOFIBRATE 54 MG PO TABS: ORAL_TABLET | ORAL | 5 refills | Status: DC

## 2018-07-26 NOTE — Progress Notes (Signed)
Virtual Visit via Telephone Note  I connected with Cindy Weiss on 07/26/18 at  9:00 AM EDT by telephone and verified that I am speaking with the correct person using two identifiers.   I discussed the limitations, risks, security and privacy concerns of performing an evaluation and management service by telephone and the availability of in person appointments. I also discussed with the patient that there may be a patient responsible charge related to this service. The patient expressed understanding and agreed to proceed. Patient is at her job and I am in te office   History of Present Illness:  Denies recent fever or chills. Denies sinus pressure, nasal congestion, ear pain or sore throat. Denies chest congestion, productive cough or wheezing. Denies chest pains, palpitations and leg swelling Denies abdominal pain, nausea, vomiting,diarrhea or constipation.   Denies dysuria, frequency, hesitancy or incontinence. Denies joint pain, swelling and limitation in mobility. Denies headaches, seizures, numbness, or tingling. Still grieving over the unexpected loss of her spouse, but overall doing well      Observations/Objective: BP 128/62   Ht 5' 4.5" (1.638 m)   Wt 165 lb (74.8 kg)   SpO2 98%   BMI 27.88 kg/m  Good communication with no confusion and intact memory. Alert and oriented x 3 No signs of respiratory distress during sppech    Assessment and Plan: HTN (hypertension) Controlled, no change in medication DASH diet and commitment to daily physical activity for a minimum of 30 minutes discussed and encouraged, as a part of hypertension management. The importance of attaining a healthy weight is also discussed.  BP/Weight 07/26/2018 01/12/2018 12/30/2017 10/07/2017 08/26/2017 08/10/2017 4/76/5465  Systolic BP 035 465 681 275 170 017 494  Diastolic BP 62 62 76 90 90 95 84  Wt. (Lbs) 165 165 168 166.4 170 170 172.04  BMI 27.88 27.88 28.39 28.12 29.18 28.29 29.53        Hyperlipidemia Less well controlled, needs to reduce fried and fatty foods and processed foods Hyperlipidemia:Low fat diet discussed and encouraged.   Lipid Panel  Lab Results  Component Value Date   CHOL 218 (H) 07/22/2018   HDL 83 07/22/2018   LDLCALC 122 (H) 07/22/2018   TRIG 43 07/22/2018   CHOLHDL 2.6 07/22/2018       Overweight .  Patient re-educated about  the importance of commitment to a  minimum of 150 minutes of exercise per week as able.  The importance of healthy food choices with portion control discussed, as well as eating regularly and within a 12 hour window most days. The need to choose "clean , green" food 50 to 75% of the time is discussed, as well as to make water the primary drink and set a goal of 64 ounces water daily.  Encouraged to start a food diary,  and to consider  joining a support group. Sample diet sheets offered. Goals set by the patient for the next several months.   Weight /BMI 07/26/2018 01/12/2018 12/30/2017  WEIGHT 165 lb 165 lb 168 lb  HEIGHT 5' 4.5" 5' 4.5" 5' 4.5"  BMI 27.88 kg/m2 27.88 kg/m2 28.39 kg/m2      Grief Improve after 1 year    Follow Up Instructions:    I discussed the assessment and treatment plan with the patient. The patient was provided an opportunity to ask questions and all were answered. The patient agreed with the plan and demonstrated an understanding of the instructions.   The patient was advised to call back  or seek an in-person evaluation if the symptoms worsen or if the condition fails to improve as anticipated.  I provided 15 minutes of non-face-to-face time during this encounter.   Tula Nakayama, MD

## 2018-07-26 NOTE — Assessment & Plan Note (Signed)
Controlled, no change in medication DASH diet and commitment to daily physical activity for a minimum of 30 minutes discussed and encouraged, as a part of hypertension management. The importance of attaining a healthy weight is also discussed.  BP/Weight 07/26/2018 01/12/2018 12/30/2017 10/07/2017 08/26/2017 08/10/2017 0/40/4591  Systolic BP 368 599 234 144 360 165 800  Diastolic BP 62 62 76 90 90 95 84  Wt. (Lbs) 165 165 168 166.4 170 170 172.04  BMI 27.88 27.88 28.39 28.12 29.18 28.29 29.53

## 2018-07-26 NOTE — Patient Instructions (Addendum)
F/U in November, call if you need me sooner   Mammogram is being scheduled for September  Please reduce fried and fatty foods, eat  More vegetable, fruit, chicken and beans, your cholesterol is higher than it should be and your fasting blood sugar , though normal, is higher than in the past and a bove goal  It is important that you exercise regularly at least 30 minutes 5 times a week. If you develop chest pain, have severe difficulty breathing, or feel very tired, stop exercising immediately and seek medical attention   Think about what you will eat, plan ahead. Choose " clean, green, fresh or frozen" over canned, processed or packaged foods which are more sugary, salty and fatty. 70 to 75% of food eaten should be vegetables and fruit. Three meals at set times with snacks allowed between meals, but they must be fruit or vegetables. Aim to eat over a 12 hour period , example 7 am to 7 pm, and STOP after  your last meal of the day. Drink water,generally about 64 ounces per day, no other drink is as healthy. Fruit juice is best enjoyed in a healthy way, by EATING the fruit.  Social distancing. Frequent hand washing with soap and water Keeping your hands off of your face. These 3 practices will help to keep both you and your community healthy during this time. Please practice them faithfully!   Thanks for choosing [facility], we consider it a privelige to serve you.

## 2018-07-26 NOTE — Assessment & Plan Note (Signed)
Less well controlled, needs to reduce fried and fatty foods and processed foods Hyperlipidemia:Low fat diet discussed and encouraged.   Lipid Panel  Lab Results  Component Value Date   CHOL 218 (H) 07/22/2018   HDL 83 07/22/2018   LDLCALC 122 (H) 07/22/2018   TRIG 43 07/22/2018   CHOLHDL 2.6 07/22/2018

## 2018-07-26 NOTE — Assessment & Plan Note (Signed)
Improve after 1 year

## 2018-07-26 NOTE — Assessment & Plan Note (Signed)
.    Patient re-educated about  the importance of commitment to a  minimum of 150 minutes of exercise per week as able.  The importance of healthy food choices with portion control discussed, as well as eating regularly and within a 12 hour window most days. The need to choose "clean , green" food 50 to 75% of the time is discussed, as well as to make water the primary drink and set a goal of 64 ounces water daily.  Encouraged to start a food diary,  and to consider  joining a support group. Sample diet sheets offered. Goals set by the patient for the next several months.   Weight /BMI 07/26/2018 01/12/2018 12/30/2017  WEIGHT 165 lb 165 lb 168 lb  HEIGHT 5' 4.5" 5' 4.5" 5' 4.5"  BMI 27.88 kg/m2 27.88 kg/m2 28.39 kg/m2

## 2018-07-31 ENCOUNTER — Encounter: Payer: Self-pay | Admitting: Family Medicine

## 2018-07-31 NOTE — Assessment & Plan Note (Signed)
Stable with rate control and asymptomatic, continue oral anticoagulant , and followed by cardiology

## 2018-08-12 ENCOUNTER — Other Ambulatory Visit: Payer: Self-pay | Admitting: Cardiology

## 2018-08-19 ENCOUNTER — Ambulatory Visit (HOSPITAL_COMMUNITY): Payer: Self-pay

## 2018-08-23 ENCOUNTER — Other Ambulatory Visit: Payer: Self-pay | Admitting: Cardiology

## 2018-11-08 ENCOUNTER — Telehealth: Payer: Self-pay | Admitting: *Deleted

## 2018-11-08 DIAGNOSIS — E7849 Other hyperlipidemia: Secondary | ICD-10-CM

## 2018-11-08 NOTE — Telephone Encounter (Signed)
Needs fasting lipid and hepatic panel please dx hyperlipidemia

## 2018-11-08 NOTE — Telephone Encounter (Signed)
What labs do you want to order

## 2018-11-08 NOTE — Telephone Encounter (Signed)
Needs lab work ordered going for blood work for physical that is 12-21-18

## 2018-11-09 NOTE — Telephone Encounter (Signed)
Labs ordered.

## 2018-11-09 NOTE — Addendum Note (Signed)
Addended by: Eual Fines on: 11/09/2018 09:53 AM   Modules accepted: Orders

## 2018-11-21 ENCOUNTER — Other Ambulatory Visit: Payer: Self-pay

## 2018-11-21 ENCOUNTER — Ambulatory Visit (HOSPITAL_COMMUNITY)
Admission: RE | Admit: 2018-11-21 | Discharge: 2018-11-21 | Disposition: A | Discharge status: Home / Self Care | Payer: Self-pay | Source: Ambulatory Visit | Attending: Family Medicine | Admitting: Family Medicine

## 2018-11-21 DIAGNOSIS — Z1231 Encounter for screening mammogram for malignant neoplasm of breast: Secondary | ICD-10-CM | POA: Insufficient documentation

## 2018-12-14 ENCOUNTER — Other Ambulatory Visit: Payer: Self-pay

## 2018-12-14 DIAGNOSIS — U071 COVID-19: Secondary | ICD-10-CM

## 2018-12-15 LAB — NOVEL CORONAVIRUS, NAA: SARS-CoV-2, NAA: NOT DETECTED

## 2018-12-21 ENCOUNTER — Encounter: Payer: Self-pay | Admitting: Family Medicine

## 2019-01-05 ENCOUNTER — Encounter: Payer: Self-pay | Admitting: Family Medicine

## 2019-01-05 ENCOUNTER — Other Ambulatory Visit: Payer: Self-pay

## 2019-01-05 ENCOUNTER — Ambulatory Visit (INDEPENDENT_AMBULATORY_CARE_PROVIDER_SITE_OTHER): Payer: Self-pay | Admitting: Family Medicine

## 2019-01-05 VITALS — BP 130/86 | HR 76 | Temp 97.8°F | Resp 15 | Ht 64.5 in | Wt 170.0 lb

## 2019-01-05 DIAGNOSIS — Z23 Encounter for immunization: Secondary | ICD-10-CM

## 2019-01-05 DIAGNOSIS — E7849 Other hyperlipidemia: Secondary | ICD-10-CM

## 2019-01-05 DIAGNOSIS — I10 Essential (primary) hypertension: Secondary | ICD-10-CM

## 2019-01-05 DIAGNOSIS — Z0000 Encounter for general adult medical examination without abnormal findings: Secondary | ICD-10-CM

## 2019-01-05 LAB — HEPATIC FUNCTION PANEL
AG Ratio: 1.5 (calc) (ref 1.0–2.5)
ALT: 14 U/L (ref 6–29)
AST: 16 U/L (ref 10–35)
Albumin: 4.5 g/dL (ref 3.6–5.1)
Alkaline phosphatase (APISO): 59 U/L (ref 37–153)
Bilirubin, Direct: 0.1 mg/dL (ref 0.0–0.2)
Globulin: 3 g/dL (calc) (ref 1.9–3.7)
Indirect Bilirubin: 0.4 mg/dL (calc) (ref 0.2–1.2)
Total Bilirubin: 0.5 mg/dL (ref 0.2–1.2)
Total Protein: 7.5 g/dL (ref 6.1–8.1)

## 2019-01-05 LAB — LIPID PANEL
Cholesterol: 233 mg/dL — ABNORMAL HIGH (ref <200)
HDL: 85 mg/dL (ref >=50)
LDL Cholesterol (Calc): 137 mg/dL (calc) — ABNORMAL HIGH
Non-HDL Cholesterol (Calc): 148 mg/dL (calc) — ABNORMAL HIGH (ref <130)
Total CHOL/HDL Ratio: 2.7 (calc) (ref <5.0)
Triglycerides: 35 mg/dL (ref <150)

## 2019-01-05 NOTE — Assessment & Plan Note (Signed)

## 2019-01-05 NOTE — Patient Instructions (Addendum)
F/u with MD in 6 months in office, call if you need me before   Cholesterol is too high, please reduce [place] sausage, fried and fatty foods  Flu vaccine today  Fasting CBC, lipid, cmp and EGFR, TSH early April  It is important that you exercise regularly at least 30 minutes 5 times a week. If you develop chest pain, have severe difficulty breathing, or feel very tired, stop exercising immediately and seek medical attention   Think about what you will eat, plan ahead. Choose " clean, green, fresh or frozen" over canned, processed or packaged foods which are more sugary, salty and fatty. 70 to 75% of food eaten should be vegetables and fruit. Three meals at set times with snacks allowed between meals, but they must be fruit or vegetables. Aim to eat over a 12 hour period , example 7 am to 7 pm, and STOP after  your last meal of the day. Drink water,generally about 64 ounces per day, no other drink is as healthy. Fruit juice is best enjoyed in a healthy way, by EATING the fruit.   Thanks for choosing [facility], we consider it a privelige to serve you.

## 2019-01-06 NOTE — Progress Notes (Signed)
    Cindy Weiss     MRN: [id number]      DOB: [birth year]  HPI: Patient is in for annual physical exam. No other health concerns are expressed or addressed at the visit. Recent labs,  are reviewed. Immunization is reviewed , and  updated    PE: BP 130/86   Pulse 76   Temp 97.8 F (36.6 C) (Temporal)   Resp 15   Ht 5' 4.5" (1.638 m)   Wt 170 lb (77.1 kg)   SpO2 100%   BMI 28.73 kg/m   Pleasant  female, alert and oriented x 3, in no cardio-pulmonary distress. Afebrile. HEENT No facial trauma or asymetry. Sinuses non tender.  Extra occullar muscles intact.. External ears normal, . Neck: supple, no adenopathy,JVD or thyromegaly.No bruits.  Chest: Clear to ascultation bilaterally.No crackles or wheezes. Non tender to palpation  Breast: No asymetry,no masses or lumps. No tenderness. No nipple discharge or inversion. No axillary or supraclavicular adenopathy  Cardiovascular system; Heart sounds normal,  S1 and  S2 ,no S3.  No murmur, or thrill. Apical beat not displaced Peripheral pulses normal.  Abdomen: Soft, non tender, no organomegaly or masses. No bruits. Bowel sounds normal. No guarding, tenderness or rebound.   GU: No physical exam, pt is asymptomatic, and her pap is not due   Musculoskeletal exam: Full ROM of spine, hips , shoulders and knees. No deformity ,swelling or crepitus noted. No muscle wasting or atrophy.   Neurologic: Cranial nerves 2 to 12 intact. Power, tone ,sensation and reflexes normal throughout. No disturbance in gait. No tremor.  Skin: Intact, no ulceration, erythema , scaling or rash noted. Pigmentation normal throughout  Psych; Normal mood and affect. Judgement and concentration normal   Assessment & Plan:  Annual physical exam Annual exam as documented. Counseling done  re healthy lifestyle involving commitment to 150 minutes exercise per week, heart healthy diet, and attaining healthy weight.The importance of adequate  sleep also discussed. Regular seat belt use and home safety, is also discussed. Changes in health habits are decided on by the patient with goals and time frames  set for achieving them. Immunization and cancer screening needs are specifically addressed at this visit.

## 2019-01-30 ENCOUNTER — Telehealth: Payer: Self-pay | Admitting: Cardiology

## 2019-01-30 NOTE — Telephone Encounter (Signed)

## 2019-01-31 ENCOUNTER — Other Ambulatory Visit: Payer: Self-pay

## 2019-01-31 MED ORDER — METOPROLOL SUCCINATE ER 25 MG PO TB24: ORAL_TABLET | ORAL | 3 refills | Status: DC

## 2019-01-31 NOTE — Telephone Encounter (Signed)
Refilled metoprolol 

## 2019-02-06 NOTE — Progress Notes (Signed)
Virtual Visit via Telephone Note   This visit type was conducted due to national recommendations for restrictions regarding the COVID-19 Pandemic (e.g. social distancing) in an effort to limit this patient's exposure and mitigate transmission in our community.  Due to her co-morbid illnesses, this patient is at least at moderate risk for complications without adequate follow up.  This format is felt to be most appropriate for this patient at this time.  The patient did not have access to video technology/had technical difficulties with video requiring transitioning to audio format only (telephone).  All issues noted in this document were discussed and addressed.  No physical exam could be performed with this format.  Please refer to the patient's chart for her  consent to telehealth for [facility].   Date:  02/07/2019   ID:  Cindy Weiss, DOB 1960-05-04, MRN [id number]  Patient Location: Home Provider Location: Office  PCP:  Fayrene Helper, MD  Cardiologist:  Rozann Lesches, MD Electrophysiologist:  None   Evaluation Performed:  Follow-Up Visit  Chief Complaint:   Cardiac follow-up  History of Present Illness:    Cindy Weiss is a 58 y.o. female last seen in October 2019 by Ms. Strader PA-C.  We spoke by phone today.  She tells me that over the last year she has done relatively well, only rare sense of palpitations, no exertional chest pain.  She has not been exercising as regularly.  I talked with her about a walking plan.  I reviewed her recent lab work as outlined below.  She does not report any spontaneous bleeding problems on Xarelto.  Continues to tolerate Toprol at current dose.  I did review her last ECG from February 2019 and we discussed coming by to get a follow-up ECG in the [place] office.  She also has paperwork to complete for Xarelto assistance.  She continues to see Dr. Moshe Cipro for primary care.  The patient does not have symptoms concerning for  COVID-19 infection (fever, chills, cough, or new shortness of breath).    Past Medical History:  Diagnosis Date  . Anemia   . Fibroids   . Hyperlipidemia   . Hypertension   . PAF (paroxysmal atrial fibrillation) ([place])    a. diagnosed in 03/2017. Spontaneous conversion to NSR    Past Surgical History:  Procedure Laterality Date  . BREAST EXCISIONAL BIOPSY Left    benign  . MYOMECTOMY       Current Meds  Medication Sig  . fenofibrate 54 MG tablet TAKE 1 TABLET(54 MG) BY MOUTH DAILY  . metoprolol succinate (TOPROL-XL) 25 MG 24 hr tablet TAKE 1 TABLET(25 MG) BY MOUTH DAILY  . Multiple Vitamins-Minerals (ALIVE ENERGY 50+ PO) Take 2 tablets by mouth daily.  Alveda Reasons 20 MG TABS tablet TAKE 1 TABLET(20 MG) BY MOUTH DAILY WITH SUPPER     Allergies:   Pravastatin and Eliquis [apixaban]   Social History   Tobacco Use  . Smoking status: Never Smoker  . Smokeless tobacco: Never Used  Substance Use Topics  . Alcohol use: No  . Drug use: No     Family Hx: The patient's family history includes Hyperlipidemia in her mother; Hypertension in her sister; Thyroid disease in her father.   ROS:   Please see the history of present illness. All other systems reviewed and are negative.   Prior CV studies:   The following studies were reviewed today:  Echocardiogram 04/08/2017: Study Conclusions  - Left ventricle: The cavity size  was normal. Wall thickness was   normal. Systolic function was normal. The estimated ejection   fraction was in the range of 60% to 65%. Wall motion was normal;   there were no regional wall motion abnormalities. Left   ventricular diastolic function parameters were normal. - Mitral valve: There was mild regurgitation. - Right atrium: Central venous pressure (est): 3 mm Hg. - Atrial septum: No defect or patent foramen ovale was identified. - Tricuspid valve: There was trivial regurgitation. - Pulmonary arteries: Systolic pressure could not be accurately    estimated. - Pericardium, extracardiac: There was no pericardial effusion.  Impressions:  - Normal LV wall thickness with LVEF 60-65% and grossly normal   diastolic function. Mild mitral regurgitation. Trivial tricuspid   regurgitation.  Labs/Other Tests and Data Reviewed:    EKG:  An ECG dated 05/06/2017 was personally reviewed today and demonstrated:  Sinus rhythm with RSR' V2 and V3.  Recent Labs: 07/22/2018: BUN 16; Creat 0.82; Hemoglobin 11.8; Platelets 307; Potassium 4.5; Sodium 139 01/04/2019: ALT 14   Recent Lipid Panel Lab Results  Component Value Date/Time   CHOL 233 (H) 01/04/2019 04:33 PM   TRIG 35 01/04/2019 04:33 PM   HDL 85 01/04/2019 04:33 PM   CHOLHDL 2.7 01/04/2019 04:33 PM   LDLCALC 137 (H) 01/04/2019 04:33 PM    Wt Readings from Last 3 Encounters:  02/07/19 170 lb 6.4 oz (77.3 kg)  01/05/19 170 lb (77.1 kg)  07/26/18 165 lb (74.8 kg)     Objective:    Vital Signs:  BP 132/88   Pulse 69   Ht 5' 4.5" (1.638 m)   Wt 170 lb 6.4 oz (77.3 kg)   BMI 28.80 kg/m    Patient spoke in full sentences, not short of breath. No audible wheezing or coughing. Speech pattern normal.  ASSESSMENT & PLAN:    1.  Paroxysmal atrial fibrillation by history.  She does not report any progressive sense of palpitations or shortness of breath and continues on Toprol-XL along with Xarelto.  I reviewed her last lab work per Dr. Moshe Cipro.  Plan to have her come by the [place] office for an ECG and also bring in her paperwork for Xarelto assistance.  2.  Essential hypertension, systolic is in the Q000111Q today.  She continues to follow with Dr. Moshe Cipro and is on Toprol-XL.  I recommended a regular walking plan for exercise.  3.  Mixed hyperlipidemia with history of statin intolerance.  Her last LDL was 137.  Discussed diet and exercise.  COVID-19 Education: The signs and symptoms of COVID-19 were discussed with the patient and how to seek care for testing (follow up with PCP or  arrange E-visit).  The importance of social distancing was discussed today.  Time:   Today, I have spent 7 minutes with the patient with telehealth technology discussing the above problems.     Medication Adjustments/Labs and Tests Ordered: Current medicines are reviewed at length with the patient today.  Concerns regarding medicines are outlined above.   Tests Ordered: No orders of the defined types were placed in this encounter.   Medication Changes: No orders of the defined types were placed in this encounter.   Follow Up:  In Person 1 year in the [place] office.  Signed, Rozann Lesches, MD  02/07/2019 11:59 AM    [place]

## 2019-02-07 ENCOUNTER — Ambulatory Visit: Payer: Self-pay | Admitting: Family Medicine

## 2019-02-07 ENCOUNTER — Telehealth (INDEPENDENT_AMBULATORY_CARE_PROVIDER_SITE_OTHER): Payer: Self-pay | Admitting: Cardiology

## 2019-02-07 ENCOUNTER — Encounter: Payer: Self-pay | Admitting: Cardiology

## 2019-02-07 VITALS — BP 132/88 | HR 69 | Ht 64.5 in | Wt 170.4 lb

## 2019-02-07 DIAGNOSIS — I480 Paroxysmal atrial fibrillation: Secondary | ICD-10-CM

## 2019-02-07 DIAGNOSIS — I10 Essential (primary) hypertension: Secondary | ICD-10-CM

## 2019-02-07 DIAGNOSIS — E782 Mixed hyperlipidemia: Secondary | ICD-10-CM

## 2019-02-07 NOTE — Patient Instructions (Addendum)
Medication Instructions:   Your physician recommends that you continue on your current medications as directed. Please refer to the Current Medication list given to you today.  Labwork:  NONE  Testing/Procedures:  NONE  Follow-Up:  Your physician recommends that you schedule a follow-up appointment in: 1 year at the [place] office. You will receive a reminder letter in the mail in about 10 months reminding you to call and schedule your appointment. If you don't receive this letter, please contact our office.  Please schedule a nurse visit at the [place] office to get an EKG done. Please take your xarelto patient assistance paperwork to this appointment.   Any Other Special Instructions Will Be Listed Below (If Applicable).  If you need a refill on your cardiac medications before your next appointment, please call your pharmacy.

## 2019-02-08 ENCOUNTER — Telehealth: Payer: Self-pay | Admitting: Cardiology

## 2019-02-08 NOTE — Telephone Encounter (Signed)
Spoke with pt. Who needs samples until pt assistance paper work is completed on Friday. 2 sample bottles placed at front desk for pick up. Lot # [id number] Exp date 6-21.

## 2019-02-08 NOTE — Telephone Encounter (Signed)
Asking for Xarelto samples will run out of medication before her next visit

## 2019-02-09 ENCOUNTER — Other Ambulatory Visit: Payer: Self-pay

## 2019-02-09 ENCOUNTER — Ambulatory Visit: Payer: Self-pay | Admitting: Family Medicine

## 2019-02-09 MED ORDER — RIVAROXABAN 20 MG PO TABS: ORAL_TABLET | ORAL | 11 refills | Status: DC

## 2019-02-09 NOTE — Telephone Encounter (Signed)
-

## 2019-02-15 ENCOUNTER — Telehealth: Payer: Self-pay | Admitting: *Deleted

## 2019-02-15 ENCOUNTER — Other Ambulatory Visit: Payer: Self-pay

## 2019-02-15 MED ORDER — FENOFIBRATE 54 MG PO TABS: ORAL_TABLET | ORAL | 5 refills | Status: DC

## 2019-02-15 NOTE — Telephone Encounter (Signed)
Medication refilled

## 2019-02-15 NOTE — Telephone Encounter (Signed)
Pt called said she is going to be out of her cholesterol pill sent to [facility]

## 2019-02-16 ENCOUNTER — Ambulatory Visit (INDEPENDENT_AMBULATORY_CARE_PROVIDER_SITE_OTHER): Payer: Self-pay

## 2019-02-16 ENCOUNTER — Other Ambulatory Visit: Payer: Self-pay

## 2019-02-16 VITALS — BP 158/90 | HR 71 | Temp 95.6°F | Ht 64.0 in | Wt 169.0 lb

## 2019-02-16 DIAGNOSIS — I480 Paroxysmal atrial fibrillation: Secondary | ICD-10-CM

## 2019-02-16 NOTE — Patient Instructions (Signed)
We will call you if there are any medication changes. Keep a daily record of your BP for the next two weeks and call back      Thank you for choosing [facility] !

## 2019-02-16 NOTE — Progress Notes (Signed)
Seen in office today, EKG, NSR. Dropped off patient assistance form for Xarelto

## 2019-02-21 ENCOUNTER — Telehealth: Payer: Self-pay | Admitting: *Deleted

## 2019-02-21 NOTE — Telephone Encounter (Signed)
Error

## 2019-02-21 NOTE — Telephone Encounter (Addendum)
Xarelto pt assist forms faxed.

## 2019-02-21 NOTE — Telephone Encounter (Signed)
Approved for Xarelto patient assistance for 1 year. Called to notify pt. NA, left msg to call back.

## 2019-04-14 ENCOUNTER — Ambulatory Visit: Payer: Self-pay | Attending: Internal Medicine

## 2019-04-14 ENCOUNTER — Other Ambulatory Visit: Payer: Self-pay

## 2019-04-14 DIAGNOSIS — Z20822 Contact with and (suspected) exposure to covid-19: Secondary | ICD-10-CM | POA: Insufficient documentation

## 2019-04-15 LAB — NOVEL CORONAVIRUS, NAA: SARS-CoV-2, NAA: NOT DETECTED

## 2019-04-18 ENCOUNTER — Telehealth: Payer: Self-pay

## 2019-04-18 ENCOUNTER — Telehealth: Payer: Self-pay | Admitting: *Deleted

## 2019-04-18 NOTE — Telephone Encounter (Signed)
Pt calling for covid results, negative, verbalizes understanding.  Requesting mailed copy, will alert office. Also states she may CB with FAX number and information.

## 2019-04-18 NOTE — Telephone Encounter (Signed)
Pt called to request a copy of her covid test results to be mailed to her home. Sent request to nurse.   [place]

## 2019-04-18 NOTE — Telephone Encounter (Signed)
Attempted to sign up pt to MyChart but pt does not have a e-mail address and could not complete form. Advised pt to call back with fax number and name of person who will receive fax. Pt verbalized understanding.

## 2019-05-08 ENCOUNTER — Ambulatory Visit: Payer: Self-pay | Attending: Internal Medicine

## 2019-05-08 ENCOUNTER — Other Ambulatory Visit: Payer: Self-pay

## 2019-05-08 DIAGNOSIS — Z20822 Contact with and (suspected) exposure to covid-19: Secondary | ICD-10-CM | POA: Insufficient documentation

## 2019-05-09 LAB — NOVEL CORONAVIRUS, NAA: SARS-CoV-2, NAA: NOT DETECTED

## 2019-05-11 ENCOUNTER — Telehealth: Payer: Self-pay | Admitting: Family Medicine

## 2019-05-11 NOTE — Telephone Encounter (Signed)
Negative COVID results given. Patient results "NOT Detected." Caller expressed understanding. ° °

## 2019-05-25 ENCOUNTER — Telehealth: Payer: Self-pay

## 2019-05-25 NOTE — Telephone Encounter (Signed)
I spoke with patient, she will obtain vaccine  when her age group is allowed.

## 2019-05-25 NOTE — Telephone Encounter (Signed)
We encourage all patients to get the covid vaccine.LM for patient to call back.

## 2019-05-25 NOTE — Telephone Encounter (Signed)
Pt is calling concerned regarding the covid 19 shot, please call her  I advised the pt that Dr Moshe Cipro encourages her pt to get the injections- She wanted a nurse

## 2019-05-25 NOTE — Telephone Encounter (Signed)
Pt would like to know if she can get COVID vaccine.   Please call [phone]   Thanks renee

## 2019-05-29 NOTE — Telephone Encounter (Signed)
Called and left message on machine that Dr Moshe Cipro did recommend the covid vaccine and if she had any specific questions she can call back to the office

## 2019-07-11 ENCOUNTER — Ambulatory Visit: Payer: Self-pay | Admitting: Family Medicine

## 2019-07-24 ENCOUNTER — Other Ambulatory Visit: Payer: Self-pay

## 2019-07-24 DIAGNOSIS — R7301 Impaired fasting glucose: Secondary | ICD-10-CM

## 2019-07-24 DIAGNOSIS — E7849 Other hyperlipidemia: Secondary | ICD-10-CM

## 2019-07-24 DIAGNOSIS — I10 Essential (primary) hypertension: Secondary | ICD-10-CM

## 2019-07-25 ENCOUNTER — Other Ambulatory Visit: Payer: Self-pay | Admitting: *Deleted

## 2019-07-25 ENCOUNTER — Ambulatory Visit (INDEPENDENT_AMBULATORY_CARE_PROVIDER_SITE_OTHER): Payer: Self-pay | Admitting: Family Medicine

## 2019-07-25 ENCOUNTER — Other Ambulatory Visit (HOSPITAL_COMMUNITY)
Admission: RE | Admit: 2019-07-25 | Discharge: 2019-07-25 | Disposition: A | Discharge status: Home / Self Care | Payer: Self-pay | Source: Ambulatory Visit | Attending: Family Medicine | Admitting: Family Medicine

## 2019-07-25 ENCOUNTER — Encounter: Payer: Self-pay | Admitting: Family Medicine

## 2019-07-25 ENCOUNTER — Other Ambulatory Visit: Payer: Self-pay

## 2019-07-25 VITALS — BP 146/96 | HR 88 | Temp 97.7°F | Resp 15 | Ht 64.0 in | Wt 166.8 lb

## 2019-07-25 DIAGNOSIS — I480 Paroxysmal atrial fibrillation: Secondary | ICD-10-CM

## 2019-07-25 DIAGNOSIS — E7849 Other hyperlipidemia: Secondary | ICD-10-CM

## 2019-07-25 DIAGNOSIS — I10 Essential (primary) hypertension: Secondary | ICD-10-CM | POA: Insufficient documentation

## 2019-07-25 DIAGNOSIS — R7303 Prediabetes: Secondary | ICD-10-CM

## 2019-07-25 DIAGNOSIS — R7302 Impaired glucose tolerance (oral): Secondary | ICD-10-CM

## 2019-07-25 DIAGNOSIS — E663 Overweight: Secondary | ICD-10-CM

## 2019-07-25 LAB — CBC
HCT: 35.7 % — ABNORMAL LOW (ref 36.0–46.0)
Hemoglobin: 11.5 g/dL — ABNORMAL LOW (ref 12.0–15.0)
MCH: 22 pg — ABNORMAL LOW (ref 26.0–34.0)
MCHC: 32.2 g/dL (ref 30.0–36.0)
MCV: 68.3 fL — ABNORMAL LOW (ref 80.0–100.0)
Platelets: 286 10*3/uL (ref 150–400)
RBC: 5.23 MIL/uL — ABNORMAL HIGH (ref 3.87–5.11)
RDW: 14.5 % (ref 11.5–15.5)
WBC: 8.3 10*3/uL (ref 4.0–10.5)
nRBC: 0 % (ref 0.0–0.2)

## 2019-07-25 LAB — COMPREHENSIVE METABOLIC PANEL
ALT: 25 U/L (ref 0–44)
AST: 19 U/L (ref 15–41)
Albumin: 4.6 g/dL (ref 3.5–5.0)
Alkaline Phosphatase: 67 U/L (ref 38–126)
Anion gap: 9 (ref 5–15)
BUN: 13 mg/dL (ref 6–20)
CO2: 26 mmol/L (ref 22–32)
Calcium: 10 mg/dL (ref 8.9–10.3)
Chloride: 104 mmol/L (ref 98–111)
Creatinine, Ser: 0.76 mg/dL (ref 0.44–1.00)
GFR calc Af Amer: >60 mL/min (ref >60)
GFR calc non Af Amer: >60 mL/min (ref >60)
Glucose, Bld: 85 mg/dL (ref 70–99)
Potassium: 3.8 mmol/L (ref 3.5–5.1)
Sodium: 139 mmol/L (ref 135–145)
Total Bilirubin: 0.7 mg/dL (ref 0.3–1.2)
Total Protein: 7.9 g/dL (ref 6.5–8.1)

## 2019-07-25 LAB — LIPID PANEL
Cholesterol: 234 mg/dL — ABNORMAL HIGH (ref 0–200)
HDL: 94 mg/dL (ref >40)
LDL Cholesterol: 130 mg/dL — ABNORMAL HIGH (ref 0–99)
Total CHOL/HDL Ratio: 2.5 RATIO
Triglycerides: 51 mg/dL (ref <150)
VLDL: 10 mg/dL (ref 0–40)

## 2019-07-25 LAB — TSH: TSH: 0.93 u[IU]/mL (ref 0.350–4.500)

## 2019-07-25 MED ORDER — FENOFIBRATE 54 MG PO TABS: ORAL_TABLET | ORAL | 5 refills | Status: DC

## 2019-07-25 NOTE — Assessment & Plan Note (Signed)
Cindy Weiss is re-educated about the importance of exercise daily to help with weight management. A minumum of 30 minutes daily is recommended. Additionally, importance of healthy food choices  with portion control discussed.   Wt Readings from Last 3 Encounters:  07/25/19 166 lb 12.8 oz (75.7 kg)  02/16/19 169 lb (76.7 kg)  02/07/19 170 lb 6.4 oz (77.3 kg)

## 2019-07-25 NOTE — Assessment & Plan Note (Signed)
Stable, rate controlled today in the office.  Asymptomatic.  Continue oral coagulation with Xarelto.  And follow-up with cardiology regularly.

## 2019-07-25 NOTE — Assessment & Plan Note (Signed)
Needs to get updated labs. °

## 2019-07-25 NOTE — Progress Notes (Signed)
Subjective:  Patient ID: Cindy Weiss, female    DOB: [birth year]  Age: 59 y.o. MRN: [id number]  CC:  Chief Complaint  Patient presents with  . Hypothyroidism    follow up visit- is fasting       HPI  HPI  [name] is a 59 year old female patient of Dr. Griffin Cindy Weiss who presents today for follow-up regarding the need for having updated lab work, has a history of hypertension, hyperlipidemia, anemia, prediabetic, proximal atrial fibrillation.  Is also on Xarelto for anticoagulation.  Unfortunately her husband passed away in 2017/06/30 unexpectedly and she has not had insurance since.  She has a difficult time when it comes to getting her medications, labs and office visit payments.  She has not been able to get on any insurance or find any insurance that was affordable for her.  She was unaware of Cindy Weiss's financial program and was provided with this information today.  She denies having any issues or concerns just really wants to make sure that her heart is doing okay and that her labs are okay.  She comes in today fasting.  Today patient denies signs and symptoms of COVID 19 infection including fever, chills, cough, shortness of breath, and headache. Past Medical, Surgical, Social History, Allergies, and Medications have been Reviewed.   Past Medical History:  Diagnosis Date  . Anemia   . Annual physical exam 08/07/2014  . Fibroids   . FIBROIDS, UTERUS 03/04/2009   Qualifier: Diagnosis of  By: Moshe Cipro MD, Joycelyn Schmid    . Hyperlipidemia   . Hypertension   . Impaired glucose tolerance 04/07/2017  . PAF (paroxysmal atrial fibrillation) ([place])    a. diagnosed in 03/2017. Spontaneous conversion to NSR     Current Meds  Medication Sig  . fenofibrate 54 MG tablet TAKE 1 TABLET(54 MG) BY MOUTH DAILY  . metoprolol succinate (TOPROL-XL) 25 MG 24 hr tablet TAKE 1 TABLET(25 MG) BY MOUTH DAILY  . rivaroxaban (XARELTO) 20 MG TABS tablet TAKE 1 TABLET(20 MG) BY MOUTH DAILY WITH SUPPER  .  [DISCONTINUED] fenofibrate 54 MG tablet TAKE 1 TABLET(54 MG) BY MOUTH DAILY  . [DISCONTINUED] Multiple Vitamins-Minerals (ALIVE ENERGY 50+ PO) Take 2 tablets by mouth daily.    ROS:  Review of Systems  Constitutional: Negative.   HENT: Negative.   Eyes: Negative.   Respiratory: Negative.   Cardiovascular: Negative.   Gastrointestinal: Negative.   Genitourinary: Negative.   Musculoskeletal: Negative.   Skin: Negative.   Neurological: Negative.   Endo/Heme/Allergies: Negative.   Psychiatric/Behavioral: Negative.   All other systems reviewed and are negative.    Objective:   Today's Vitals: BP (!) 146/96   Pulse 88   Temp 97.7 F (36.5 C) (Temporal)   Resp 15   Ht 5\' 4"  (1.626 m)   Wt 166 lb 12.8 oz (75.7 kg)   SpO2 96%   BMI 28.63 kg/m  Vitals with BMI 07/25/2019 02/16/2019 02/07/2019  Height 5\' 4"  5\' 4"  5' 4.5"  Weight 166 lbs 13 oz 169 lbs 170 lbs 6 oz  BMI 28.62 [id number]  Systolic 123456 0000000 Q000111Q  Diastolic 96 90 88  Pulse 88 71 69     Physical Exam Vitals and nursing note reviewed.  Constitutional:      Appearance: Normal appearance. She is well-developed, well-groomed and overweight.  HENT:     Head: Normocephalic and atraumatic.     Right Ear: External ear normal.     Left Ear: External ear normal.  Nose: Nose normal.     Mouth/Throat:     Mouth: Mucous membranes are moist.     Pharynx: Oropharynx is clear.     Comments: Mask in place  Eyes:     General:        Right eye: No discharge.        Left eye: No discharge.     Conjunctiva/sclera: Conjunctivae normal.  Cardiovascular:     Rate and Rhythm: Normal rate and regular rhythm.     Pulses: Normal pulses.     Heart sounds: Normal heart sounds.  Pulmonary:     Effort: Pulmonary effort is normal.     Breath sounds: Normal breath sounds.  Musculoskeletal:        General: Normal range of motion.     Cervical back: Normal range of motion and neck supple.  Skin:    General: Skin is warm.    Neurological:     General: No focal deficit present.     Mental Status: She is alert and oriented to person, place, and time.  Psychiatric:        Attention and Perception: Attention normal.        Mood and Affect: Mood normal.        Speech: Speech normal.        Behavior: Behavior normal. Behavior is cooperative.        Thought Content: Thought content normal.        Cognition and Memory: Cognition normal.        Judgment: Judgment normal.     Assessment   1. Paroxysmal atrial fibrillation (HCC)   2. Essential hypertension   3. Impaired glucose tolerance   4. Prediabetes   5. Overweight   6. Other hyperlipidemia     Tests ordered No orders of the defined types were placed in this encounter.    Plan: Please see assessment and plan per problem list above.   Meds ordered this encounter  Medications  . fenofibrate 54 MG tablet    Sig: TAKE 1 TABLET(54 MG) BY MOUTH DAILY    Dispense:  30 tablet    Refill:  5    Patient to follow-up in 6 months  Perlie Mayo, NP

## 2019-07-25 NOTE — Assessment & Plan Note (Signed)
Slightly elevated today however she is very frustrated with her lab work she does not have insurance right now and owes the lab bill before she can get her labs drawn at [facility].  She was unaware current financial assistance program.  However we provided her with that information today.  She felt much better after having this conversation. Continue current medications.  Encouraged DASH diet and exercise.

## 2019-07-25 NOTE — Patient Instructions (Signed)
I appreciate the opportunity to provide you with care for your health and wellness. Today we discussed: overall health   Follow up: 6 months  Labs-fasting today at [facility]  No referrals today  Please continue to practice social distancing to keep you, your family, and our community safe.  If you must go out, please wear a mask and practice good handwashing.  It was a pleasure to see you and I look forward to continuing to work together on your health and well-being. Please do not hesitate to call the office if you need care or have questions about your care.  Have a wonderful day and week. With Gratitude, Cherly Beach, DNP, AGNP-BC

## 2019-07-25 NOTE — Assessment & Plan Note (Signed)
Needs updated labs.  Encouraged low-fat heart healthy diet.

## 2019-10-23 ENCOUNTER — Other Ambulatory Visit (HOSPITAL_COMMUNITY): Payer: Self-pay | Admitting: Family Medicine

## 2019-10-23 ENCOUNTER — Ambulatory Visit (INDEPENDENT_AMBULATORY_CARE_PROVIDER_SITE_OTHER): Payer: Self-pay | Admitting: Family Medicine

## 2019-10-23 ENCOUNTER — Encounter: Payer: Self-pay | Admitting: Family Medicine

## 2019-10-23 ENCOUNTER — Other Ambulatory Visit: Payer: Self-pay

## 2019-10-23 VITALS — BP 147/101 | HR 81 | Resp 16 | Ht 64.0 in | Wt 164.0 lb

## 2019-10-23 DIAGNOSIS — M25561 Pain in right knee: Secondary | ICD-10-CM | POA: Insufficient documentation

## 2019-10-23 DIAGNOSIS — E663 Overweight: Secondary | ICD-10-CM

## 2019-10-23 DIAGNOSIS — I480 Paroxysmal atrial fibrillation: Secondary | ICD-10-CM

## 2019-10-23 DIAGNOSIS — G8929 Other chronic pain: Secondary | ICD-10-CM

## 2019-10-23 DIAGNOSIS — I10 Essential (primary) hypertension: Secondary | ICD-10-CM

## 2019-10-23 DIAGNOSIS — Z1231 Encounter for screening mammogram for malignant neoplasm of breast: Secondary | ICD-10-CM

## 2019-10-23 MED ORDER — HYDROCHLOROTHIAZIDE 12.5 MG PO CAPS: 12.5000 mg | ORAL_CAPSULE | Freq: Every day | ORAL | 3 refills | Status: DC

## 2019-10-23 MED ORDER — POTASSIUM CHLORIDE CRYS ER 20 MEQ PO TBCR
20.0000 meq | EXTENDED_RELEASE_TABLET | Freq: Every day | ORAL | 3 refills | Status: DC
Start: 2019-10-23 — End: 2019-12-26

## 2019-10-23 NOTE — Patient Instructions (Addendum)
U in office with mD re eval blood pressure in 6 weeks, call if you need me sooner  New additional medication for blood pressure is hCTZ 12.5 mg dai;ly and potassium 20 meq one daily, cONTINUE your other medications as before  For arthritis in right knee you are referred to Orthopedics. Use tylenol ES one tablet twice daily, apply ice to knee once or twice daily and use icy hot topically for pain relief also  Mammogram to be scheduled at checkout.  Letter in system saying that your are approved 1000 % for care in the system, nurse will explain  Work excuse for today return in 1 day  Careful not to fall   Thanks for choosing [facility], we consider it a privelige to serve you.

## 2019-10-23 NOTE — Assessment & Plan Note (Signed)
Uncontrolled, add hCTZ 12.5 ta 1 daily DASH diet and commitment to daily physical activity for a minimum of 30 minutes discussed and encouraged, as a part of hypertension management. The importance of attaining a healthy weight is also discussed.  BP/Weight 10/23/2019 07/25/2019 02/16/2019 02/07/2019 01/05/2019 07/26/2018 22/33/6122  Systolic BP 449 753 005 110 211 173 567  Diastolic BP 014 96 90 88 86 62 62  Wt. (Lbs) 164.04 166.8 169 170.4 170 165 165  BMI 28.16 28.63 29.01 28.8 28.73 27.88 27.88

## 2019-10-23 NOTE — Progress Notes (Signed)
   Cindy Weiss     MRN: [id number]      DOB: 08/31/1960   HPI Cindy Weiss is here   3 monht h/o right kneee pain and popping preventing her from working today. Denioes falls/ instability, markedly limits mobility  ROS Denies recent fever or chills. Denies sinus pressure, nasal congestion, ear pain or sore throat. Denies chest congestion, productive cough or wheezing. Denies chest pains, palpitations and leg swelling Denies abdominal pain, nausea, vomiting,diarrhea or constipation.   Denies dysuria, frequency, hesitancy or incontinence. Denies joint pain, swelling and limitation in mobility. Denies headaches, seizures, numbness, or tingling. Stressed , depressed , tearful anxious due to lack of health insurance despite years of consistent hard work Denies skin break down or rash.   PE  BP (!) 147/101   Pulse 81   Resp 16   Ht 5\' 4"  (1.626 m)   Wt 164 lb 0.6 oz (74.4 kg)   SpO2 96%   BMI 28.16 kg/m   Patient alert and oriented and in no cardiopulmonary distress.  HEENT: No facial asymmetry, EOMI,     Neck supple .  Chest: Clear to auscultation bilaterally.  CVS: S1, S2 no murmurs, no S3.Regular rate.  ABD: Soft non tender.   Ext: No edema  MS: Adequate ROM spine, shoulders, hips . Reduced ROM left knee with deformity and crepitus.  Skin: Intact, no ulcerations or rash noted.  Psych: Good eye contact, normal affect. Memory intact, tearful,  anxious and  depressed appearing.  CNS: CN 2-12 intact, power,  normal throughout.no focal deficits noted.   Assessment & Plan  HTN (hypertension) Uncontrolled, add hCTZ 12.5 ta 1 daily DASH diet and commitment to daily physical activity for a minimum of 30 minutes discussed and encouraged, as a part of hypertension management. The importance of attaining a healthy weight is also discussed.  BP/Weight 10/23/2019 07/25/2019 02/16/2019 02/07/2019 01/05/2019 07/26/2018 40/10/6759  Systolic BP 950 932 671 245 130 809 983    Diastolic BP 382 96 90 88 86 62 62  Wt. (Lbs) 164.04 166.8 169 170.4 170 165 165  BMI 28.16 28.63 29.01 28.8 28.73 27.88 27.88        Overweight Improved,   Patient re-educated about  the importance of commitment to a  minimum of 150 minutes of exercise per week as able.  The importance of healthy food choices with portion control discussed, as well as eating regularly and within a 12 hour window most days. The need to choose "clean , green" food 50 to 75% of the time is discussed, as well as to make water the primary drink and set a goal of 64 ounces water daily.    Weight /BMI 10/23/2019 07/25/2019 02/16/2019  WEIGHT 164 lb 0.6 oz 166 lb 12.8 oz 169 lb  HEIGHT 5\' 4"  5\' 4"  5\' 4"   BMI 28.16 kg/m2 28.63 kg/m2 29.01 kg/m2      Knee pain, right 3 month h/o increased pain woith popping. No falls/ near fally C/o swelling, and unable to work as a  Dentist, wants help.Med options limited as she is on blood thinner Tylenol ES, topical treatment and Ortho  Paroxysmal atrial fibrillation (HCC) Rate controlled

## 2019-10-24 ENCOUNTER — Encounter: Payer: Self-pay | Admitting: Family Medicine

## 2019-10-24 NOTE — Assessment & Plan Note (Addendum)
3 month h/o increased pain woith popping. No falls/ near fally C/o swelling, and unable to work as a  Dentist, wants help.Med options limited as she is on blood thinner Tylenol ES, topical treatment and Ortho

## 2019-10-24 NOTE — Assessment & Plan Note (Signed)
Improved,   Patient re-educated about  the importance of commitment to a  minimum of 150 minutes of exercise per week as able.  The importance of healthy food choices with portion control discussed, as well as eating regularly and within a 12 hour window most days. The need to choose "clean , green" food 50 to 75% of the time is discussed, as well as to make water the primary drink and set a goal of 64 ounces water daily.    Weight /BMI 10/23/2019 07/25/2019 02/16/2019  WEIGHT 164 lb 0.6 oz 166 lb 12.8 oz 169 lb  HEIGHT 5\' 4"  5\' 4"  5\' 4"   BMI 28.16 kg/m2 28.63 kg/m2 29.01 kg/m2

## 2019-10-24 NOTE — Assessment & Plan Note (Signed)
Rate controlled 

## 2019-12-01 ENCOUNTER — Ambulatory Visit: Payer: Self-pay | Admitting: Orthopedic Surgery

## 2019-12-26 ENCOUNTER — Ambulatory Visit (INDEPENDENT_AMBULATORY_CARE_PROVIDER_SITE_OTHER): Payer: Self-pay | Admitting: Family Medicine

## 2019-12-26 ENCOUNTER — Other Ambulatory Visit: Payer: Self-pay

## 2019-12-26 ENCOUNTER — Ambulatory Visit: Payer: Self-pay | Admitting: Orthopedic Surgery

## 2019-12-26 ENCOUNTER — Encounter: Payer: Self-pay | Admitting: Family Medicine

## 2019-12-26 VITALS — BP 160/96 | HR 72 | Ht 64.0 in | Wt 157.0 lb

## 2019-12-26 DIAGNOSIS — I10 Essential (primary) hypertension: Secondary | ICD-10-CM

## 2019-12-26 DIAGNOSIS — E663 Overweight: Secondary | ICD-10-CM

## 2019-12-26 DIAGNOSIS — I480 Paroxysmal atrial fibrillation: Secondary | ICD-10-CM

## 2019-12-26 DIAGNOSIS — E7849 Other hyperlipidemia: Secondary | ICD-10-CM

## 2019-12-26 NOTE — Patient Instructions (Addendum)
Keep November appointment  Please start HCTZ 12.5 mg daily prescribed as blood pressure is high  Eat banana and tomatoes for potassium  Fasting lipid, cmp and eGFr 1 week before follow up We will check on mammogram coverage with the letter that you have ( copy with nurse) . I believe that you will get your mammogram covered ,  We will let you know  I hope that your stress on the job  will lessen as you are able to get more help  It is important that you exercise regularly at least 30 minutes 5 times a week. If you develop chest pain, have severe difficulty breathing, or feel very tired, stop exercising immediately and seek medical attention  Think about what you will eat, plan ahead. Choose " clean, green, fresh or frozen" over canned, processed or packaged foods which are more sugary, salty and fatty. 70 to 75% of food eaten should be vegetables and fruit. Three meals at set times with snacks allowed between meals, but they must be fruit or vegetables. Aim to eat over a 12 hour period , example 7 am to 7 pm, and STOP after  your last meal of the day. Drink water,generally about 64 ounces per day, no other drink is as healthy. Fruit juice is best enjoyed in a healthy way, by EATING the fruit. Thanks for choosing [facility], we consider it a privelige to serve you.

## 2019-12-28 ENCOUNTER — Ambulatory Visit: Payer: Self-pay

## 2019-12-28 ENCOUNTER — Ambulatory Visit (INDEPENDENT_AMBULATORY_CARE_PROVIDER_SITE_OTHER): Payer: Self-pay | Admitting: Orthopedic Surgery

## 2019-12-28 ENCOUNTER — Other Ambulatory Visit: Payer: Self-pay

## 2019-12-28 ENCOUNTER — Encounter: Payer: Self-pay | Admitting: Orthopedic Surgery

## 2019-12-28 VITALS — BP 166/107 | HR 80 | Ht 64.0 in | Wt 154.0 lb

## 2019-12-28 DIAGNOSIS — M25561 Pain in right knee: Secondary | ICD-10-CM

## 2019-12-28 DIAGNOSIS — G8929 Other chronic pain: Secondary | ICD-10-CM

## 2019-12-28 NOTE — Progress Notes (Signed)
Patient ID: Cindy Weiss, female   DOB: 19-Aug-1960, 59 y.o.   MRN: [id number]  Chief Complaint  Patient presents with  . Knee Pain    she is unsure of the date of when she fell back, works at daycare,     59 year old female on Xarelto and some metoprolol for rapid heart rate presents with improving right knee pain.  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   Past Medical History:  Diagnosis Date  . Anemia   . Annual physical exam 08/07/2014  . Fibroids   . FIBROIDS, UTERUS 03/04/2009   Qualifier: Diagnosis of  By: Moshe Cipro MD, Joycelyn Schmid    . Hyperlipidemia   . Hypertension   . Impaired glucose tolerance 04/07/2017  . PAF (paroxysmal atrial fibrillation) ([place])    a. diagnosed in 03/2017. Spontaneous conversion to NSR     Past Surgical History:  Procedure Laterality Date  . BREAST EXCISIONAL BIOPSY Left    benign  . MYOMECTOMY      PHYSICAL EXAM  BP (!) 166/107   Pulse 80   Ht 5\' 4"  (1.626 m)   Wt 154 lb (69.9 kg)   BMI 26.43 kg/m  GENERAL appearance reveals no gross abnormalities, normal development grooming and hygiene   MENTAL STATUS we note that the patient is awake alert and oriented to person place and time MOOD/AFFECT ARE NORMAL   GAIT reveals NO limp in the effected limb  Right Knee Exam   Muscle Strength  The patient has normal right knee strength.  Tenderness  The patient is experiencing tenderness in the medial joint line and pes anserinus.  Range of Motion  Extension: normal   Tests  Drawer:  Anterior - negative    Posterior - negative  Other  Erythema: absent Scars: absent Sensation: none Pulse:  absent Swelling: none Effusion: no effusion present     VASC 2+ dorsalis pedis pulse normal capillary refill excellent warmth to the extremity  NEURO normal sensation and no pathologic reflexes  LYMPH deferred noncontributory  MEDICAL DECISION MAKING  A.  Encounter Diagnosis  Name Primary?  . Chronic pain of right knee Yes    B. DATA ANALYSED:    IMAGING: Independent interpretation of images: AP BOTH AND 2 ADDITIONAL VIEWS RIGHT   mild arthritis medial compartment and patellofemoral lateral facet Orders:   Outside records reviewed: NO  C. MANAGEMENT  WT LOSS TYLENOL  KNEE EXERCISES  FU NONE SCHEDULED   No orders of the defined types were placed in this encounter.

## 2019-12-28 NOTE — Patient Instructions (Signed)
Exercises for the knee will help with the pain   You have to use tylenol for pain due to the blood thinner you are on.  You can also use heat and ice and TOPICAL MEDICATIONS   These are the muscle and arthrits creams I recommend:  PLEASE READ THE PACKAGE INSTRUCTIONS BEFORE USING   Ben Gay arthritis cream  Icy hot vanishing gel  Aspercreme odor free  Myoflex Oderless pain reliever  Capzasin  Sportscreme  Max freeze

## 2019-12-30 ENCOUNTER — Encounter: Payer: Self-pay | Admitting: Family Medicine

## 2019-12-30 NOTE — Assessment & Plan Note (Signed)
  Patient re-educated about  the importance of commitment to a  minimum of 150 minutes of exercise per week as able.  The importance of healthy food choices with portion control discussed, as well as eating regularly and within a 12 hour window most days. The need to choose "clean , green" food 50 to 75% of the time is discussed, as well as to make water the primary drink and set a goal of 64 ounces water daily.    Weight /BMI 12/28/2019 12/26/2019 10/23/2019  WEIGHT 154 lb 157 lb 164 lb 0.6 oz  HEIGHT 5\' 4"  5\' 4"  5\' 4"   BMI 26.43 kg/m2 26.95 kg/m2 28.16 kg/m2

## 2019-12-30 NOTE — Assessment & Plan Note (Signed)
Uncontrolled, has not been taking hCTZ states she  is afraid of adverse s/e. I encourage her to start the medication and if she notices a problem to stop it and to let me know DASH diet and commitment to daily physical activity for a minimum of 30 minutes discussed and encouraged, as a part of hypertension management. The importance of attaining a healthy weight is also discussed.  BP/Weight 12/28/2019 12/26/2019 10/23/2019 07/25/2019 02/16/2019 02/07/2019 45/06/979  Systolic BP 191 478 295 621 308 657 846  Diastolic BP 962 96 952 96 90 88 86  Wt. (Lbs) 154 157 164.04 166.8 169 170.4 170  BMI 26.43 26.95 28.16 28.63 29.01 28.8 28.73

## 2019-12-30 NOTE — Assessment & Plan Note (Signed)
Currently rate controlled

## 2019-12-30 NOTE — Assessment & Plan Note (Signed)
Hyperlipidemia:Low fat diet discussed and encouraged.   Lipid Panel  Lab Results  Component Value Date   CHOL 234 (H) 07/25/2019   HDL 94 07/25/2019   LDLCALC 130 (H) 07/25/2019   TRIG 51 07/25/2019   CHOLHDL 2.5 07/25/2019  not at goal Updated lab needed at/ before next visit.

## 2019-12-30 NOTE — Progress Notes (Signed)
   Cindy Weiss     MRN: [id number]      DOB: 15-Dec-1960   HPI Cindy Weiss is here for follow up and re-evaluation of chronic medical conditions, medication management and review of any available recent lab and radiology data.  Reports frustration, unable to get mammogram, states constantly being turned down for help seems as though no one cares. Currently has a letter showing 100 % coverage wuith the [facility] system, spoke with support staff while pt is in the office and we will continue to attempt to assist  Cindy Weiss in getting the health care that has been approved for her   ROS Denies recent fever or chills. Denies sinus pressure, nasal congestion, ear pain or sore throat. Denies chest congestion, productive cough or wheezing. Denies chest pains, palpitations and leg swelling C/o stress on the job, long hours and insufficient help   PE  BP (!) 160/96   Pulse 72   Ht 5\' 4"  (1.626 m)   Wt 157 lb (71.2 kg)   SpO2 98%   BMI 26.95 kg/m   Patient alert and oriented  HEENT: No facial asymmetry, EOMI,     Neck supple .  Chest: Clear to auscultation bilaterally.  CVS: S1, S2 no murmurs, no S3.Regular rate.    Ext: No edema  Cindy: Adequate ROM spine, shoulders, hips and knees.   Psych: Good eye contact, normal affect. Memory intact not anxious or depressed appearing.  CNS: CN 2-12 intact, .no focal deficits noted.   Assessment & Plan  HTN (hypertension) Uncontrolled, has not been taking hCTZ states she  is afraid of adverse s/e. I encourage her to start the medication and if she notices a problem to stop it and to let me know DASH diet and commitment to daily physical activity for a minimum of 30 minutes discussed and encouraged, as a part of hypertension management. The importance of attaining a healthy weight is also discussed.  BP/Weight 12/28/2019 12/26/2019 10/23/2019 07/25/2019 02/16/2019 02/07/2019 76/03/6071  Systolic BP 710 626 948 546 270 350 093  Diastolic BP 818  96 299 96 90 88 86  Wt. (Lbs) 154 157 164.04 166.8 169 170.4 170  BMI 26.43 26.95 28.16 28.63 29.01 28.8 28.73       Hyperlipidemia Hyperlipidemia:Low fat diet discussed and encouraged.   Lipid Panel  Lab Results  Component Value Date   CHOL 234 (H) 07/25/2019   HDL 94 07/25/2019   LDLCALC 130 (H) 07/25/2019   TRIG 51 07/25/2019   CHOLHDL 2.5 07/25/2019  not at goal Updated lab needed at/ before next visit.      Paroxysmal atrial fibrillation (HCC) Currently rate controlled  Overweight  Patient re-educated about  the importance of commitment to a  minimum of 150 minutes of exercise per week as able.  The importance of healthy food choices with portion control discussed, as well as eating regularly and within a 12 hour window most days. The need to choose "clean , green" food 50 to 75% of the time is discussed, as well as to make water the primary drink and set a goal of 64 ounces water daily.    Weight /BMI 12/28/2019 12/26/2019 10/23/2019  WEIGHT 154 lb 157 lb 164 lb 0.6 oz  HEIGHT 5\' 4"  5\' 4"  5\' 4"   BMI 26.43 kg/m2 26.95 kg/m2 28.16 kg/m2

## 2020-01-09 ENCOUNTER — Telehealth: Payer: Self-pay | Admitting: *Deleted

## 2020-01-09 NOTE — Telephone Encounter (Signed)
Pt called returning betsys  Call

## 2020-01-17 NOTE — Telephone Encounter (Signed)
LVM with details on the Mobile Mammogram Nov 17 @ 8:30 to 4:30 [place] parking lot

## 2020-01-22 ENCOUNTER — Other Ambulatory Visit: Payer: Self-pay

## 2020-01-22 ENCOUNTER — Telehealth: Payer: Self-pay | Admitting: Family Medicine

## 2020-01-22 DIAGNOSIS — E7849 Other hyperlipidemia: Secondary | ICD-10-CM

## 2020-01-22 DIAGNOSIS — I10 Essential (primary) hypertension: Secondary | ICD-10-CM

## 2020-01-22 NOTE — Telephone Encounter (Signed)
Patient requesting lab order sent to [facility] as she doesn't have insurance p# [phone]

## 2020-01-22 NOTE — Telephone Encounter (Signed)
Lab order sent to [facility]

## 2020-01-23 ENCOUNTER — Other Ambulatory Visit (HOSPITAL_COMMUNITY)
Admission: RE | Admit: 2020-01-23 | Discharge: 2020-01-23 | Disposition: A | Discharge status: Home / Self Care | Payer: Self-pay | Source: Ambulatory Visit | Attending: Family Medicine | Admitting: Family Medicine

## 2020-01-23 ENCOUNTER — Other Ambulatory Visit: Payer: Self-pay

## 2020-01-23 DIAGNOSIS — E7849 Other hyperlipidemia: Secondary | ICD-10-CM | POA: Insufficient documentation

## 2020-01-23 DIAGNOSIS — I10 Essential (primary) hypertension: Secondary | ICD-10-CM | POA: Insufficient documentation

## 2020-01-23 LAB — COMPREHENSIVE METABOLIC PANEL
ALT: 16 U/L (ref 0–44)
AST: 18 U/L (ref 15–41)
Albumin: 4.5 g/dL (ref 3.5–5.0)
Alkaline Phosphatase: 52 U/L (ref 38–126)
Anion gap: 12 (ref 5–15)
BUN: 18 mg/dL (ref 6–20)
CO2: 27 mmol/L (ref 22–32)
Calcium: 10.5 mg/dL — ABNORMAL HIGH (ref 8.9–10.3)
Chloride: 100 mmol/L (ref 98–111)
Creatinine, Ser: 0.78 mg/dL (ref 0.44–1.00)
GFR, Estimated: >60 mL/min (ref >60)
Glucose, Bld: 86 mg/dL (ref 70–99)
Potassium: 4.1 mmol/L (ref 3.5–5.1)
Sodium: 139 mmol/L (ref 135–145)
Total Bilirubin: 0.8 mg/dL (ref 0.3–1.2)
Total Protein: 7.9 g/dL (ref 6.5–8.1)

## 2020-01-23 LAB — LIPID PANEL
Cholesterol: 215 mg/dL — ABNORMAL HIGH (ref 0–200)
HDL: 77 mg/dL (ref >40)
LDL Cholesterol: 130 mg/dL — ABNORMAL HIGH (ref 0–99)
Total CHOL/HDL Ratio: 2.8 RATIO
Triglycerides: 39 mg/dL (ref <150)
VLDL: 8 mg/dL (ref 0–40)

## 2020-01-24 ENCOUNTER — Other Ambulatory Visit: Payer: Self-pay

## 2020-01-24 ENCOUNTER — Ambulatory Visit (INDEPENDENT_AMBULATORY_CARE_PROVIDER_SITE_OTHER): Payer: Self-pay | Admitting: Family Medicine

## 2020-01-24 ENCOUNTER — Encounter: Payer: Self-pay | Admitting: Family Medicine

## 2020-01-24 VITALS — BP 134/94 | HR 82 | Resp 16 | Ht 64.0 in | Wt 153.0 lb

## 2020-01-24 DIAGNOSIS — Z0001 Encounter for general adult medical examination with abnormal findings: Secondary | ICD-10-CM

## 2020-01-24 DIAGNOSIS — I10 Essential (primary) hypertension: Secondary | ICD-10-CM

## 2020-01-24 NOTE — Patient Instructions (Addendum)
F/U in office with MD in 6 months, call if u you need me sooner  Keep up good health habits  Please schedule MAMMOGRAM  CONGRATS ON IMPROVED BLOOD PRESSURE AND CHOLESTEROL AND WEIGHT LOSS  CALL FOR LABS 2 WEEKS BEFORE NEXT APPOINTMENT  It is important that you exercise regularly at least 30 minutes 5 times a week. If you develop chest pain, have severe difficulty breathing, or feel very tired, stop exercising immediately and seek medical attention  Think about what you will eat, plan ahead. Choose " clean, green, fresh or frozen" over canned, processed or packaged foods which are more sugary, salty and fatty. 70 to 75% of food eaten should be vegetables and fruit. Three meals at set times with snacks allowed between meals, but they must be fruit or vegetables. Aim to eat over a 12 hour period , example 7 am to 7 pm, and STOP after  your last meal of the day. Drink water,generally about 64 ounces per day, no other drink is as healthy. Fruit juice is best enjoyed in a healthy way, by EATING the fruit. Thanks for choosing [facility], we consider it a privelige to serve you.

## 2020-01-25 ENCOUNTER — Telehealth: Payer: Self-pay

## 2020-01-25 NOTE — Telephone Encounter (Signed)
Telephoned patient at home number. Left a voice message with [facility] contact information. 

## 2020-01-28 ENCOUNTER — Encounter: Payer: Self-pay | Admitting: Family Medicine

## 2020-01-28 DIAGNOSIS — Z0001 Encounter for general adult medical examination with abnormal findings: Secondary | ICD-10-CM | POA: Insufficient documentation

## 2020-01-28 NOTE — Progress Notes (Signed)
    Cindy Weiss     MRN: [id number]      DOB: March 15, 1961  HPI: Patient is in for annual physical exam. No other health concerns are expressed or addressed at the visit. Recent labs,  are reviewed. Immunization is reviewed , and  updated   PE: BP (!) 134/94   Pulse 82   Resp 16   Ht 5\' 4"  (1.626 m)   Wt 153 lb 0.6 oz (69.4 kg)   SpO2 99%   BMI 26.27 kg/m   Pleasant  female, alert and oriented x 3, in no cardio-pulmonary distress. Afebrile. HEENT No facial trauma or asymetry. Sinuses non tender.  Extra occullar muscles intact.. External ears normal, . Neck: supple, no adenopathy,JVD or thyromegaly.No bruits.  Chest: Clear to ascultation bilaterally.No crackles or wheezes. Non tender to palpation  Breast: No asymetry,no masses or lumps. No tenderness. No nipple discharge or inversion. No axillary or supraclavicular adenopathy  Cardiovascular system; Heart sounds normal,  S1 and  S2 ,no S3.  No murmur, or thrill. Apical beat not displaced Peripheral pulses normal.  Abdomen: Soft, non tender, no organomegaly or masses. No bruits. Bowel sounds normal. No guarding, tenderness or rebound.      Musculoskeletal exam: Full ROM of spine, hips , shoulders and knees. No deformity ,swelling or crepitus noted. No muscle wasting or atrophy.   Neurologic: Cranial nerves 2 to 12 intact. Power, tone ,sensation and reflexes normal throughout. No disturbance in gait. No tremor.  Skin: Intact, no ulceration, erythema , scaling or rash noted. Pigmentation normal throughout  Psych; Normal mood and affect. Judgement and concentration normal   Assessment & Plan:  Encounter for annual general medical examination with abnormal findings in adult Annual exam as documented. Counseling done  re healthy lifestyle involving commitment to 150 minutes exercise per week, heart healthy diet, and attaining healthy weight.The importance of adequate sleep also discussed. Regular seat  belt use and home safety, is also discussed. Changes in health habits are decided on by the patient with goals and time frames  set for achieving them. Immunization and cancer screening needs are specifically addressed at this visit.   HTN (hypertension) Uncontrolled no med change DASH diet and commitment to daily physical activity for a minimum of 30 minutes discussed and encouraged, as a part of hypertension management. The importance of attaining a healthy weight is also discussed.  BP/Weight 01/24/2020 12/28/2019 12/26/2019 10/23/2019 07/25/2019 02/16/2019 64/33/2951  Systolic BP 884 166 063 016 010 932 355  Diastolic BP 94 732 96 202 96 90 88  Wt. (Lbs) 153.04 154 157 164.04 166.8 169 170.4  BMI 26.27 26.43 26.95 28.16 28.63 29.01 28.8

## 2020-01-28 NOTE — Assessment & Plan Note (Signed)

## 2020-01-28 NOTE — Assessment & Plan Note (Signed)
Uncontrolled no med change DASH diet and commitment to daily physical activity for a minimum of 30 minutes discussed and encouraged, as a part of hypertension management. The importance of attaining a healthy weight is also discussed.  BP/Weight 01/24/2020 12/28/2019 12/26/2019 10/23/2019 07/25/2019 02/16/2019 94/50/3888  Systolic BP 280 034 917 915 056 979 480  Diastolic BP 94 165 96 537 96 90 88  Wt. (Lbs) 153.04 154 157 164.04 166.8 169 170.4  BMI 26.27 26.43 26.95 28.16 28.63 29.01 28.8

## 2020-01-31 ENCOUNTER — Other Ambulatory Visit: Payer: Self-pay

## 2020-01-31 DIAGNOSIS — Z1231 Encounter for screening mammogram for malignant neoplasm of breast: Secondary | ICD-10-CM

## 2020-02-15 ENCOUNTER — Other Ambulatory Visit: Payer: Self-pay | Admitting: Student

## 2020-02-15 ENCOUNTER — Encounter: Payer: Self-pay | Admitting: Student

## 2020-02-15 ENCOUNTER — Ambulatory Visit (INDEPENDENT_AMBULATORY_CARE_PROVIDER_SITE_OTHER): Payer: Self-pay | Admitting: Student

## 2020-02-15 ENCOUNTER — Other Ambulatory Visit: Payer: Self-pay

## 2020-02-15 VITALS — BP 142/88 | HR 84 | Ht 64.0 in | Wt 150.0 lb

## 2020-02-15 DIAGNOSIS — E785 Hyperlipidemia, unspecified: Secondary | ICD-10-CM

## 2020-02-15 DIAGNOSIS — I10 Essential (primary) hypertension: Secondary | ICD-10-CM

## 2020-02-15 DIAGNOSIS — I480 Paroxysmal atrial fibrillation: Secondary | ICD-10-CM

## 2020-02-15 MED ORDER — METOPROLOL SUCCINATE ER 25 MG PO TB24: ORAL_TABLET | ORAL | 11 refills | Status: DC

## 2020-02-15 NOTE — Progress Notes (Signed)
Cardiology Office Note    Date:  02/15/2020   ID:  Cindy Weiss, DOB 11-08-1960, MRN [id number]  PCP:  Fayrene Helper, MD  Cardiologist: Rozann Lesches, MD    Chief Complaint  Patient presents with  . Follow-up    Annual Visit    History of Present Illness:    Cindy Weiss is a 59 y.o. female with past medical history of paroxysmal atrial fibrillation (on Xarelto for anticoagulation), HTN, and HLD who presents to the office today for annual follow-up.  She most recently had a telehealth visit with Dr. Domenic Polite in 01/2019 and denied any recent chest pain or palpitations at that time. She was continued on her current medication regimen including Toprol-XL 25 mg daily and Xarelto 20 mg daily.  In talking with the patient today, she reports overall doing well since her last visit. She denies any recent palpitations, chest pain, dyspnea on exertion, orthopnea, PND or edema. No recent dizziness or presyncope. Says her BP has been elevated when checked by her PCP and she has been trying to lose weight by dieting to assist with her BP instead of going on additional medications.   She reports good compliance with Xarelto and denies any evidence of active bleeding.   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    Past Medical History:  Diagnosis Date  . Anemia   . Annual physical exam 08/07/2014  . Fibroids   . FIBROIDS, UTERUS 03/04/2009   Qualifier: Diagnosis of  By: Moshe Cipro MD, Joycelyn Schmid    . Hyperlipidemia   . Hypertension   . Impaired glucose tolerance 04/07/2017  . PAF (paroxysmal atrial fibrillation) ([place])    a. diagnosed in 03/2017. Spontaneous conversion to NSR     Past Surgical History:  Procedure Laterality Date  . BREAST EXCISIONAL BIOPSY Left    benign  . MYOMECTOMY      Current Medications: Outpatient Medications Prior to Visit  Medication Sig Dispense Refill  . fenofibrate 54 MG tablet TAKE 1 TABLET(54 MG) BY MOUTH  DAILY 30 tablet 5  . rivaroxaban (XARELTO) 20 MG TABS tablet TAKE 1 TABLET(20 MG) BY MOUTH DAILY WITH SUPPER 30 tablet 11  . metoprolol succinate (TOPROL-XL) 25 MG 24 hr tablet TAKE 1 TABLET(25 MG) BY MOUTH DAILY 90 tablet 3  . hydrochlorothiazide (MICROZIDE) 12.5 MG capsule Take 1 capsule (12.5 mg total) by mouth daily. (Patient not taking: Reported on 12/28/2019) 30 capsule 3   No facility-administered medications prior to visit.     Allergies:   Eliquis [apixaban] and Pravastatin   Social History   Socioeconomic History  . Marital status: Married    Spouse name: Not on file  . Number of children: Not on file  . Years of education: Not on file  . Highest education level: Not on file  Occupational History  . Not on file  Tobacco Use  . Smoking status: Never Smoker  . Smokeless tobacco: Never Used  Vaping Use  . Vaping Use: Never used  Substance and Sexual Activity  . Alcohol use: No  . Drug use: No  . Sexual activity: Not Currently  Other Topics Concern  . Not on file  Social History Narrative  . Not on file   Social Determinants of Health   Financial Resource Strain:   . Difficulty of Paying Living Expenses: Not on file  Food Insecurity:   . Worried About Charity fundraiser in the Last Year:  Not on file  . Ran Out of Food in the Last Year: Not on file  Transportation Needs:   . Lack of Transportation (Medical): Not on file  . Lack of Transportation (Non-Medical): Not on file  Physical Activity:   . Days of Exercise per Week: Not on file  . Minutes of Exercise per Session: Not on file  Stress:   . Feeling of Stress : Not on file  Social Connections:   . Frequency of Communication with Friends and Family: Not on file  . Frequency of Social Gatherings with Friends and Family: Not on file  . Attends Religious Services: Not on file  . Active Member of Clubs or Organizations: Not on file  . Attends Archivist Meetings: Not on file  . Marital Status: Not on  file     Family History:  The patient's family history includes Hyperlipidemia in her mother; Hypertension in her sister; Thyroid disease in her father.   Review of Systems:   Please see the history of present illness.     General:  No chills, fever, night sweats or weight changes.  Cardiovascular:  No chest pain, dyspnea on exertion, edema, orthopnea, palpitations, paroxysmal nocturnal dyspnea. Dermatological: No rash, lesions/masses Respiratory: No cough, dyspnea Urologic: No hematuria, dysuria Abdominal:   No nausea, vomiting, diarrhea, bright red blood per rectum, melena, or hematemesis MSK: Positive for knee pain (known arthritis).  Neurologic:  No visual changes, wkns, changes in mental status. All other systems reviewed and are otherwise negative except as noted above.   Physical Exam:    VS:  BP (!) 142/88   Pulse 84   Ht 5\' 4"  (1.626 m)   Wt 150 lb (68 kg)   SpO2 99%   BMI 25.75 kg/m    General: Well developed, well nourished,female appearing in no acute distress. Head: Normocephalic, atraumatic. Neck: No carotid bruits. JVD not elevated.  Lungs: Respirations regular and unlabored, without wheezes or rales.  Heart: Regular rate and rhythm. No S3 or S4.  No murmur, no rubs, or gallops appreciated. Abdomen: Appears non-distended. No obvious abdominal masses. Msk:  Strength and tone appear normal for age. No obvious joint deformities or effusions. Extremities: No clubbing or cyanosis. No lower extremity edema.  Distal pedal pulses are 2+ bilaterally. Neuro: Alert and oriented X 3. Moves all extremities spontaneously. No focal deficits noted. Psych:  Responds to questions appropriately with a normal affect. Skin: No rashes or lesions noted  Wt Readings from Last 3 Encounters:  02/15/20 150 lb (68 kg)  01/24/20 153 lb 0.6 oz (69.4 kg)  12/28/19 154 lb (69.9 kg)     Studies/Labs Reviewed:   EKG:  EKG is ordered today. The EKG ordered today demonstrates NSR, HR 76  with no acute ST changes when compared to prior tracings.   Recent Labs: 07/25/2019: Hemoglobin 11.5; Platelets 286; TSH 0.930 01/23/2020: ALT 16; BUN 18; Creatinine, Ser 0.78; Potassium 4.1; Sodium 139   Lipid Panel    Component Value Date/Time   CHOL 215 (H) 01/23/2020 1540   TRIG 39 01/23/2020 1540   HDL 77 01/23/2020 1540   CHOLHDL 2.8 01/23/2020 1540   VLDL 8 01/23/2020 1540   LDLCALC 130 (H) 01/23/2020 1540   LDLCALC 137 (H) 01/04/2019 1633    Additional studies/ records that were reviewed today include:   Echocardiogram: 03/2017 Study Conclusions   - Left ventricle: The cavity size was normal. Wall thickness was  normal. Systolic function was normal. The estimated ejection  fraction was in the range of 60% to 65%. Wall motion was normal;  there were no regional wall motion abnormalities. Left  ventricular diastolic function parameters were normal.  - Mitral valve: There was mild regurgitation.  - Right atrium: Central venous pressure (est): 3 mm Hg.  - Atrial septum: No defect or patent foramen ovale was identified.  - Tricuspid valve: There was trivial regurgitation.  - Pulmonary arteries: Systolic pressure could not be accurately  estimated.  - Pericardium, extracardiac: There was no pericardial effusion.   Impressions:   - Normal LV wall thickness with LVEF 60-65% and grossly normal  diastolic function. Mild mitral regurgitation. Trivial tricuspid  regurgitation.   Assessment:    1. Paroxysmal atrial fibrillation (HCC)   2. Essential hypertension   3. Hyperlipidemia, unspecified hyperlipidemia type      Plan:   In order of problems listed above:  1. Paroxysmal Atrial Fibrillation - She denies any recent palpitations and is in NSR by examination and EKG today. Continue Toprol-XL 25mg  daily for rate-control. - She denies any evidence of active bleeding. Hgb was at 11.5 by labs in 06/2019 with platelets at 286. Continue Xarelto 20mg  daily  for anticoagulation. She did bring with her patient assistance paperwork for Xarelto today and nursing staff are working to help get this approved.   2. HTN - BP was initially recorded as 158/98, rechecked and improved to 142/88. She has been making dietary changes and she was encouraged to continue to follow her BP in the ambulatory setting. Remains on Toprol-XL 25mg  daily which could be further titrated in the future if needed and HR allows.   3. HLD - Followed by her PCP. FLP in 12/2019 showed total cholesterol 215, HDL 77 and LDL 130. She has been on Fenofibrate given prior intolerances to statin therapy.     Medication Adjustments/Labs and Tests Ordered: Current medicines are reviewed at length with the patient today.  Concerns regarding medicines are outlined above.  Medication changes, Labs and Tests ordered today are listed in the Patient Instructions below. Patient Instructions  Medication Instructions:  Your physician recommends that you continue on your current medications as directed. Please refer to the Current Medication list given to you today.  *If you need a refill on your cardiac medications before your next appointment, please call your pharmacy*   Lab Work: NONE   If you have labs (blood work) drawn today and your tests are completely normal, you will receive your results only by: Marland Kitchen MyChart Message (if you have MyChart) OR . A paper copy in the mail If you have any lab test that is abnormal or we need to change your treatment, we will call you to review the results.   Testing/Procedures: NONE    Follow-Up: At [facility], you and your health needs are our priority.  As part of our continuing mission to provide you with exceptional heart care, we have created designated Provider Care Teams.  These Care Teams include your primary Cardiologist (physician) and Advanced Practice Providers (APPs -  Physician Assistants and Nurse Practitioners) who all work together  to provide you with the care you need, when you need it.  We recommend signing up for the patient portal called "MyChart".  Sign up information is provided on this After Visit Summary.  MyChart is used to connect with patients for Virtual Visits (Telemedicine).  Patients are able to view lab/test results, encounter notes, upcoming appointments, etc.  Non-urgent messages can be sent to your  provider as well.   To learn more about what you can do with MyChart, go to [email or web address].    Your next appointment:   1 year(s)  The format for your next appointment:   In Person  Provider:   Rozann Lesches, MD or Bernerd Pho, PA-C   Other Instructions Thank you for choosing [facility]!       Signed, Erma Heritage, PA-C  02/15/2020 7:45 PM    [facility] [address] [place], [place] [address] Phone: [phone] Fax: [phone]

## 2020-02-15 NOTE — Patient Instructions (Signed)
Medication Instructions:  Your physician recommends that you continue on your current medications as directed. Please refer to the Current Medication list given to you today.  *If you need a refill on your cardiac medications before your next appointment, please call your pharmacy*   Lab Work: NONE   If you have labs (blood work) drawn today and your tests are completely normal, you will receive your results only by: MyChart Message (if you have MyChart) OR A paper copy in the mail If you have any lab test that is abnormal or we need to change your treatment, we will call you to review the results.   Testing/Procedures: NONE    Follow-Up: At [facility], you and your health needs are our priority.  As part of our continuing mission to provide you with exceptional heart care, we have created designated Provider Care Teams.  These Care Teams include your primary Cardiologist (physician) and Advanced Practice Providers (APPs -  Physician Assistants and Nurse Practitioners) who all work together to provide you with the care you need, when you need it.  We recommend signing up for the patient portal called "MyChart".  Sign up information is provided on this After Visit Summary.  MyChart is used to connect with patients for Virtual Visits (Telemedicine).  Patients are able to view lab/test results, encounter notes, upcoming appointments, etc.  Non-urgent messages can be sent to your provider as well.   To learn more about what you can do with MyChart, go to [email or web address].    Your next appointment:   1 year(s)  The format for your next appointment:   In Person  Provider:   Samuel McDowell, MD or Brittany Strader, PA-C   Other Instructions Thank you for choosing [facility]!    

## 2020-02-18 ENCOUNTER — Other Ambulatory Visit: Payer: Self-pay | Admitting: Cardiology

## 2020-02-20 ENCOUNTER — Telehealth: Payer: Self-pay | Admitting: Family Medicine

## 2020-02-20 ENCOUNTER — Telehealth: Payer: Self-pay | Admitting: Cardiology

## 2020-02-20 ENCOUNTER — Other Ambulatory Visit: Payer: Self-pay

## 2020-02-20 MED ORDER — RIVAROXABAN 20 MG PO TABS: ORAL_TABLET | ORAL | 11 refills | Status: DC

## 2020-02-20 MED ORDER — FENOFIBRATE 54 MG PO TABS: ORAL_TABLET | ORAL | 5 refills | Status: DC

## 2020-02-20 NOTE — Telephone Encounter (Signed)
Patient need refill for Fenofibrate 54 mg. Pharmacy [facility].

## 2020-02-20 NOTE — Telephone Encounter (Signed)
Rx refilled.

## 2020-02-20 NOTE — Telephone Encounter (Signed)
New message    [facility] is saying they did not get the Xarelto refill - please resend she is out of her medication

## 2020-02-20 NOTE — Telephone Encounter (Signed)
Refill complete 

## 2020-03-01 ENCOUNTER — Telehealth: Payer: Self-pay | Admitting: *Deleted

## 2020-03-01 NOTE — Telephone Encounter (Signed)
Called to notify pt that [facility] patient assistance for Xarelto has been approved. No answer left msg to call back.

## 2020-03-19 ENCOUNTER — Encounter (INDEPENDENT_AMBULATORY_CARE_PROVIDER_SITE_OTHER): Payer: Self-pay

## 2020-03-19 ENCOUNTER — Ambulatory Visit: Payer: Self-pay | Admitting: *Deleted

## 2020-03-19 ENCOUNTER — Other Ambulatory Visit: Payer: Self-pay

## 2020-03-19 ENCOUNTER — Ambulatory Visit
Admission: RE | Admit: 2020-03-19 | Discharge: 2020-03-19 | Disposition: A | Discharge status: Home / Self Care | Payer: No Typology Code available for payment source | Source: Ambulatory Visit | Attending: Obstetrics and Gynecology | Admitting: Obstetrics and Gynecology

## 2020-03-19 VITALS — BP 146/90 | Wt 147.7 lb

## 2020-03-19 DIAGNOSIS — Z1239 Encounter for other screening for malignant neoplasm of breast: Secondary | ICD-10-CM

## 2020-03-19 DIAGNOSIS — Z1231 Encounter for screening mammogram for malignant neoplasm of breast: Secondary | ICD-10-CM

## 2020-03-19 NOTE — Progress Notes (Signed)
[name] is a 59 y.o. female who presents to [facility] clinic today with no complaints.    Pap Smear: Pap smear not completed today. Last Pap smear was 08/26/2017 at [facility] and was normal with negative HPV. Per patient has no history of an abnormal Pap smear. Last Pap smear result is available in Epic.   Physical exam: Breasts Left breast larger than right breast that per patient is normal for her. No skin abnormalities bilateral breasts. No nipple retraction bilateral breasts. No nipple discharge bilateral breasts. No lymphadenopathy. No lumps palpated bilateral breasts. No complaints of pain or tenderness on exam.       Pelvic/Bimanual Pap is not indicated today per BCCCP guidelines.   Smoking History: Patient has never smoked.   Patient Navigation: Patient education provided. Access to services provided for patient through [facility] program.   Colorectal Cancer Screening: Per patient has had a colonoscopy completed but unsure when. Patient completed a FIT Test 10/06/2017 that was negative. No complaints today.    Breast and Cervical Cancer Risk Assessment: Patient does not have family history of breast cancer, known genetic mutations, or radiation treatment to the chest before age 79. Patient does not have history of cervical dysplasia, immunocompromised, or DES exposure in-utero.  Risk Assessment    Risk Scores      03/19/2020   Last edited by: Demetrius Revel, LPN   5-year risk: 1.4 %   Lifetime risk: 6.9 %         A: BCCCP exam without pap smear No complaints.  P: Referred patient to the [facility] for a screening mammogram on the mobile unit. Appointment scheduled Tuesday, March 19, 2020 at 1400.  Loletta Parish, RN 03/19/2020 12:31 PM

## 2020-03-19 NOTE — Patient Instructions (Signed)
Explained breast self awareness with [name]. Patient did not need a Pap smear today due to last Pap smear and HPV typing was 08/26/2017. Let her know BCCCP will cover Pap smears and HPV typing every 5 years unless has a history of abnormal Pap smears. Referred patient to the [facility] for a screening mammogram on the mobile unit. Appointment scheduled Tuesday, March 19, 2020 at 1400. Patient escorted to the mobile unit following BCCCP appointment for her screening mammogram. Let patient know the [facility] will follow up with her within the next couple weeks with results of her mammogram by letter or phone. [name] verbalized understanding.  Khy Pitre, Arvil Chaco, RN 12:31 PM

## 2020-03-26 ENCOUNTER — Encounter (INDEPENDENT_AMBULATORY_CARE_PROVIDER_SITE_OTHER): Payer: Self-pay | Admitting: *Deleted

## 2020-07-24 ENCOUNTER — Ambulatory Visit: Payer: Self-pay | Admitting: Family Medicine

## 2020-08-22 ENCOUNTER — Ambulatory Visit (INDEPENDENT_AMBULATORY_CARE_PROVIDER_SITE_OTHER): Payer: Self-pay | Admitting: Family Medicine

## 2020-08-22 ENCOUNTER — Encounter: Payer: Self-pay | Admitting: Family Medicine

## 2020-08-22 ENCOUNTER — Other Ambulatory Visit: Payer: Self-pay

## 2020-08-22 VITALS — BP 160/90 | HR 64 | Temp 97.0°F | Ht 64.0 in | Wt 148.0 lb

## 2020-08-22 DIAGNOSIS — Z1211 Encounter for screening for malignant neoplasm of colon: Secondary | ICD-10-CM

## 2020-08-22 DIAGNOSIS — Z1212 Encounter for screening for malignant neoplasm of rectum: Secondary | ICD-10-CM

## 2020-08-22 DIAGNOSIS — E785 Hyperlipidemia, unspecified: Secondary | ICD-10-CM

## 2020-08-22 DIAGNOSIS — I10 Essential (primary) hypertension: Secondary | ICD-10-CM

## 2020-08-22 DIAGNOSIS — R7303 Prediabetes: Secondary | ICD-10-CM

## 2020-08-22 DIAGNOSIS — E663 Overweight: Secondary | ICD-10-CM

## 2020-08-22 DIAGNOSIS — I480 Paroxysmal atrial fibrillation: Secondary | ICD-10-CM

## 2020-08-22 MED ORDER — SPIRONOLACTONE 25 MG PO TABS: 12.5000 mg | ORAL_TABLET | Freq: Every day | ORAL | 3 refills | Status: DC

## 2020-08-22 NOTE — Assessment & Plan Note (Signed)
  Patient re-educated about  the importance of commitment to a  minimum of 150 minutes of exercise per week as able.  The importance of healthy food choices with portion control discussed, as well as eating regularly and within a 12 hour window most days. The need to choose "clean , green" food 50 to 75% of the time is discussed, as well as to make water the primary drink and set a goal of 64 ounces water daily.    Weight /BMI 08/22/2020 03/19/2020 02/15/2020  WEIGHT 148 lb 147 lb 11.2 oz 150 lb  HEIGHT 5\' 4"  - 5\' 4"   BMI 25.4 kg/m2 25.35 kg/m2 25.75 kg/m2

## 2020-08-22 NOTE — Progress Notes (Signed)
   Cindy Weiss     MRN: [id number]      DOB: [birth year]   HPI Cindy Weiss is here for follow up and re-evaluation of chronic medical conditions, medication management and review of any available recent lab and radiology data.  The PT denies any adverse reactions to current medications since the last visit.  There are no new concerns.  There are no specific complaints   ROS Denies recent fever or chills. Denies sinus pressure, nasal congestion, ear pain or sore throat. Denies chest congestion, productive cough or wheezing. Denies chest pains, palpitations and leg swelling Denies abdominal pain, nausea, vomiting,diarrhea or constipation.   Denies dysuria, frequency, hesitancy or incontinence. Denies joint pain, swelling and limitation in mobility. Denies headaches, seizures, numbness, or tingling. Denies depression, anxiety or insomnia. Denies skin break down or rash.   PE  BP (!) 160/90 (BP Location: Right Arm, Patient Position: Sitting, Cuff Size: Normal)   Pulse 64   Temp (!) 97 F (36.1 C) (Temporal)   Ht 5\' 4"  (1.626 m)   Wt 148 lb (67.1 kg)   SpO2 98%   BMI 25.40 kg/m   Patient alert and oriented and in no cardiopulmonary distress.  HEENT: No facial asymmetry, EOMI,     Neck supple .  Chest: Clear to auscultation bilaterally.  CVS: S1, S2 no murmurs, no S3.Regular rate.  ABD: Soft non tender.   Ext: No edema  MS: Adequate ROM spine, shoulders, hips and knees.  Skin: Intact, no ulcerations or rash noted.  Psych: Good eye contact, normal affect. Memory intact not anxious or depressed appearing.  CNS: CN 2-12 intact, power,  normal throughout.no focal deficits noted.   Assessment & Plan  HTN (hypertension) Uncontrolled, add spironolactone 25 mg daily DASH diet and commitment to daily physical activity for a minimum of 30 minutes discussed and encouraged, as a part of hypertension management. The importance of attaining a healthy weight is also  discussed.  BP/Weight 08/22/2020 03/19/2020 02/15/2020 01/24/2020 12/28/2019 12/26/2019 2/87/6811  Systolic BP 572 620 355 974 163 845 364  Diastolic BP 90 90 88 94 680 96 101  Wt. (Lbs) 148 147.7 150 153.04 154 157 164.04  BMI 25.4 25.35 25.75 26.27 26.43 26.95 28.16       Hyperlipidemia Hyperlipidemia:Low fat diet discussed and encouraged.   Lipid Panel  Lab Results  Component Value Date   CHOL 215 (H) 01/23/2020   HDL 77 01/23/2020   LDLCALC 130 (H) 01/23/2020   TRIG 39 01/23/2020   CHOLHDL 2.8 01/23/2020     Updated lab needed at/ before next visit.   Overweight  Patient re-educated about  the importance of commitment to a  minimum of 150 minutes of exercise per week as able.  The importance of healthy food choices with portion control discussed, as well as eating regularly and within a 12 hour window most days. The need to choose "clean , green" food 50 to 75% of the time is discussed, as well as to make water the primary drink and set a goal of 64 ounces water daily.    Weight /BMI 08/22/2020 03/19/2020 02/15/2020  WEIGHT 148 lb 147 lb 11.2 oz 150 lb  HEIGHT 5\' 4"  - 5\' 4"   BMI 25.4 kg/m2 25.35 kg/m2 25.75 kg/m2      Paroxysmal atrial fibrillation (HCC) Maintained on xarelto, currently rate controlled

## 2020-08-22 NOTE — Assessment & Plan Note (Signed)
Hyperlipidemia:Low fat diet discussed and encouraged.   Lipid Panel  Lab Results  Component Value Date   CHOL 215 (H) 01/23/2020   HDL 77 01/23/2020   LDLCALC 130 (H) 01/23/2020   TRIG 39 01/23/2020   CHOLHDL 2.8 01/23/2020     Updated lab needed at/ before next visit.

## 2020-08-22 NOTE — Assessment & Plan Note (Signed)
Maintained on xarelto, currently rate controlled

## 2020-08-22 NOTE — Assessment & Plan Note (Signed)
Uncontrolled, add spironolactone 25 mg daily DASH diet and commitment to daily physical activity for a minimum of 30 minutes discussed and encouraged, as a part of hypertension management. The importance of attaining a healthy weight is also discussed.  BP/Weight 08/22/2020 03/19/2020 02/15/2020 01/24/2020 12/28/2019 12/26/2019 6/41/5830  Systolic BP 940 768 088 110 315 945 859  Diastolic BP 90 90 88 94 292 96 101  Wt. (Lbs) 148 147.7 150 153.04 154 157 164.04  BMI 25.4 25.35 25.75 26.27 26.43 26.95 28.16

## 2020-08-22 NOTE — Patient Instructions (Addendum)
F/U for pap nad re eval Blood pressure in early September, call if you need me sooner  Please get fasting lipid, cmp and EGFr and cBC 1 week before appointment  New additional BP medication is prescribed , please take every day  Colonscopy/ colon cancer screening` is due please work   It is important that you exercise regularly at least 30 minutes 5 times a week. If you develop chest pain, have severe difficulty breathing, or feel very tired, stop exercising immediately and seek medical attention    Please increase salads even from [place], s o blood improves  It is important that you exercise regularly at least 30 minutes 5 times a week. If you develop chest pain, have severe difficulty breathing, or feel very tired, stop exercising immediately and seek medical attention

## 2020-08-28 ENCOUNTER — Encounter (INDEPENDENT_AMBULATORY_CARE_PROVIDER_SITE_OTHER): Payer: Self-pay | Admitting: *Deleted

## 2020-09-02 ENCOUNTER — Other Ambulatory Visit: Payer: Self-pay

## 2020-09-02 ENCOUNTER — Telehealth: Payer: Self-pay

## 2020-09-02 MED ORDER — FENOFIBRATE 54 MG PO TABS
ORAL_TABLET | ORAL | 5 refills | Status: DC
Start: 2020-09-02 — End: 2021-04-01

## 2020-09-02 NOTE — Telephone Encounter (Signed)
Med refilled.

## 2020-09-02 NOTE — Telephone Encounter (Signed)
Please call in Cholesterol Medication to the pharmacy

## 2020-12-26 ENCOUNTER — Other Ambulatory Visit: Payer: Self-pay

## 2020-12-26 ENCOUNTER — Ambulatory Visit (INDEPENDENT_AMBULATORY_CARE_PROVIDER_SITE_OTHER): Payer: Self-pay | Admitting: Family Medicine

## 2020-12-26 ENCOUNTER — Other Ambulatory Visit (HOSPITAL_COMMUNITY)
Admission: RE | Admit: 2020-12-26 | Discharge: 2020-12-26 | Disposition: A | Discharge status: Home / Self Care | Payer: Self-pay | Source: Ambulatory Visit | Attending: Family Medicine | Admitting: Family Medicine

## 2020-12-26 ENCOUNTER — Encounter: Payer: Self-pay | Admitting: Family Medicine

## 2020-12-26 VITALS — BP 155/95 | HR 75 | Resp 18 | Ht 64.0 in | Wt 153.0 lb

## 2020-12-26 DIAGNOSIS — Z0000 Encounter for general adult medical examination without abnormal findings: Secondary | ICD-10-CM

## 2020-12-26 DIAGNOSIS — R7303 Prediabetes: Secondary | ICD-10-CM | POA: Insufficient documentation

## 2020-12-26 DIAGNOSIS — Z124 Encounter for screening for malignant neoplasm of cervix: Secondary | ICD-10-CM

## 2020-12-26 DIAGNOSIS — I10 Essential (primary) hypertension: Secondary | ICD-10-CM

## 2020-12-26 DIAGNOSIS — Z1231 Encounter for screening mammogram for malignant neoplasm of breast: Secondary | ICD-10-CM

## 2020-12-26 DIAGNOSIS — E785 Hyperlipidemia, unspecified: Secondary | ICD-10-CM | POA: Insufficient documentation

## 2020-12-26 LAB — CBC WITH DIFFERENTIAL/PLATELET
Abs Immature Granulocytes: 0.02 10*3/uL (ref 0.00–0.07)
Basophils Absolute: 0 10*3/uL (ref 0.0–0.1)
Basophils Relative: 0 %
Eosinophils Absolute: 0.1 10*3/uL (ref 0.0–0.5)
Eosinophils Relative: 1 %
HCT: 36.5 % (ref 36.0–46.0)
Hemoglobin: 11.8 g/dL — ABNORMAL LOW (ref 12.0–15.0)
Immature Granulocytes: 0 %
Lymphocytes Relative: 24 %
Lymphs Abs: 1.8 10*3/uL (ref 0.7–4.0)
MCH: 22.1 pg — ABNORMAL LOW (ref 26.0–34.0)
MCHC: 32.3 g/dL (ref 30.0–36.0)
MCV: 68.2 fL — ABNORMAL LOW (ref 80.0–100.0)
Monocytes Absolute: 0.5 10*3/uL (ref 0.1–1.0)
Monocytes Relative: 7 %
Neutro Abs: 5 10*3/uL (ref 1.7–7.7)
Neutrophils Relative %: 68 %
Platelets: 282 10*3/uL (ref 150–400)
RBC: 5.35 MIL/uL — ABNORMAL HIGH (ref 3.87–5.11)
RDW: 14.6 % (ref 11.5–15.5)
WBC: 7.5 10*3/uL (ref 4.0–10.5)
nRBC: 0 % (ref 0.0–0.2)

## 2020-12-26 LAB — COMPREHENSIVE METABOLIC PANEL
ALT: 19 U/L (ref 0–44)
AST: 20 U/L (ref 15–41)
Albumin: 4.3 g/dL (ref 3.5–5.0)
Alkaline Phosphatase: 65 U/L (ref 38–126)
Anion gap: 8 (ref 5–15)
BUN: 15 mg/dL (ref 8–23)
CO2: 27 mmol/L (ref 22–32)
Calcium: 9.7 mg/dL (ref 8.9–10.3)
Chloride: 102 mmol/L (ref 98–111)
Creatinine, Ser: 0.83 mg/dL (ref 0.44–1.00)
GFR, Estimated: >60 mL/min (ref >60)
Glucose, Bld: 100 mg/dL — ABNORMAL HIGH (ref 70–99)
Potassium: 3.7 mmol/L (ref 3.5–5.1)
Sodium: 137 mmol/L (ref 135–145)
Total Bilirubin: 0.4 mg/dL (ref 0.3–1.2)
Total Protein: 7.8 g/dL (ref 6.5–8.1)

## 2020-12-26 LAB — LIPID PANEL
Cholesterol: 213 mg/dL — ABNORMAL HIGH (ref 0–200)
HDL: 86 mg/dL (ref >40)
LDL Cholesterol: 119 mg/dL — ABNORMAL HIGH (ref 0–99)
Total CHOL/HDL Ratio: 2.5 RATIO
Triglycerides: 40 mg/dL (ref <150)
VLDL: 8 mg/dL (ref 0–40)

## 2020-12-26 NOTE — Assessment & Plan Note (Signed)
Uncontrolled due to non compliance with regime DASH diet and commitment to daily physical activity for a minimum of 30 minutes discussed and encouraged, as a part of hypertension management. The importance of attaining a healthy weight is also discussed.  BP/Weight 12/26/2020 08/22/2020 03/19/2020 02/15/2020 01/24/2020 12/28/2019 3/53/9122  Systolic BP 583 462 194 712 527 129 290  Diastolic BP 95 90 90 88 94 107 96  Wt. (Lbs) 153 148 147.7 150 153.04 154 157  BMI 26.26 25.4 25.35 25.75 26.27 26.43 26.95     She is to start the spironolactone, f/u in 2 months

## 2020-12-26 NOTE — Assessment & Plan Note (Signed)

## 2020-12-26 NOTE — Progress Notes (Signed)
    Cindy Weiss     MRN: [id number]      DOB: 10/28/1960  HPI: Patient is in for annual physical exam. Has not beentaing spironolactone Recent labs,  are reviewed. Immunization is reviewed , and  updated if needed.   PE: BP (!) 155/95   Pulse 75   Resp 18   Ht 5\' 4"  (1.626 m)   Wt 153 lb (69.4 kg)   SpO2 98%   BMI 26.26 kg/m   Pleasant  female, alert and oriented x 3, in no cardio-pulmonary distress. Afebrile. HEENT No facial trauma or asymetry. Sinuses non tender.  Extra occullar muscles intact.. External ears normal, . Neck: supple, no adenopathy,JVD or thyromegaly.No bruits.  Chest: Clear to ascultation bilaterally.No crackles or wheezes. Non tender to palpation  Breast: No asymetry,no masses or lumps. No tenderness. No nipple discharge or inversion. No axillary or supraclavicular adenopathy  Cardiovascular system; Heart sounds normal,  S1 and  S2 ,no S3.  No murmur, or thrill. Apical beat not displaced Peripheral pulses normal.  Abdomen: Soft, non tender, no organomegaly or masses. No bruits. Bowel sounds normal. No guarding, tenderness or rebound.   GU: External genitalia normal female genitalia , normal female distribution of hair. No lesions. Urethral meatus normal in size, no  Prolapse, no lesions visibly  Present. Bladder non tender. Vagina pink and moist , with no visible lesions , discharge present . Adequate pelvic support no  cystocele or rectocele noted Cervix pink and appears healthy, no lesions or ulcerations noted, no discharge noted from os Uterus normal size, no adnexal masses, no cervical motion or adnexal tenderness.   Musculoskeletal exam: Full ROM of spine, hips , shoulders and knees. No deformity ,swelling or crepitus noted. No muscle wasting or atrophy.   Neurologic: Cranial nerves 2 to 12 intact. Power, tone ,sensation and reflexes normal throughout. No disturbance in gait. No tremor.  Skin: Intact, no ulceration,  erythema , scaling or rash noted. Pigmentation normal throughout  Psych; Normal mood and affect. Judgement and concentration normal   Assessment & Plan:  Annual physical exam Annual exam as documented. Counseling done  re healthy lifestyle involving commitment to 150 minutes exercise per week, heart healthy diet, and attaining healthy weight.The importance of adequate sleep also discussed. Regular seat belt use and home safety, is also discussed. Changes in health habits are decided on by the patient with goals and time frames  set for achieving them. Immunization and cancer screening needs are specifically addressed at this visit.   HTN (hypertension) Uncontrolled due to non compliance with regime DASH diet and commitment to daily physical activity for a minimum of 30 minutes discussed and encouraged, as a part of hypertension management. The importance of attaining a healthy weight is also discussed.  BP/Weight 12/26/2020 08/22/2020 03/19/2020 02/15/2020 01/24/2020 12/28/2019 0/48/8891  Systolic BP 694 503 888 280 034 917 915  Diastolic BP 95 90 90 88 94 107 96  Wt. (Lbs) 153 148 147.7 150 153.04 154 157  BMI 26.26 25.4 25.35 25.75 26.27 26.43 26.95     She is to start the spironolactone, f/u in 2 months

## 2020-12-26 NOTE — Patient Instructions (Addendum)
F/U in 3 months re evaluate blood pressure,  call if you need me sooner  Pap sent  Blood work is good, continue to reduce fried and fatty foods and sugar and starchy foods  I am concerned about your blood pressure which is too high, therefore increasing your risk of heart disease, stroke and kidney damage  You need to take spironolactone as directed to lower your blood pressure, continue metorpolol as before  Please get covid booster and the shingles vaccines   Mammogram is due 12/22 or after please schedule at checkout (nurse please order)  It is important that you exercise regularly at least 30 minutes 5 times a week. If you develop chest pain, have severe difficulty breathing, or feel very tired, stop exercising immediately and seek medical attention  Think about what you will eat, plan ahead. Choose " clean, green, fresh or frozen" over canned, processed or packaged foods which are more sugary, salty and fatty. 70 to 75% of food eaten should be vegetables and fruit. Three meals at set times with snacks allowed between meals, but they must be fruit or vegetables. Aim to eat over a 12 hour period , example 7 am to 7 pm, and STOP after  your last meal of the day. Drink water,generally about 64 ounces per day, no other drink is as healthy. Fruit juice is best enjoyed in a healthy way, by EATING the fruit.   Thanks for choosing [facility], we consider it a privelige to serve you.

## 2020-12-27 ENCOUNTER — Telehealth: Payer: Self-pay | Admitting: Family Medicine

## 2020-12-27 ENCOUNTER — Other Ambulatory Visit: Payer: Self-pay

## 2020-12-27 MED ORDER — SPIRONOLACTONE 25 MG PO TABS
12.5000 mg | ORAL_TABLET | Freq: Every day | ORAL | 3 refills | Status: DC
Start: 2020-12-27 — End: 2021-04-01

## 2020-12-27 NOTE — Telephone Encounter (Signed)
Med sent in.

## 2020-12-27 NOTE — Telephone Encounter (Signed)
Pt is calling checking on the new BP meds, the pharmacy does not have anything

## 2020-12-31 LAB — CYTOLOGY - PAP
Comment: NEGATIVE
Diagnosis: NEGATIVE
High risk HPV: NEGATIVE

## 2021-02-12 ENCOUNTER — Other Ambulatory Visit: Payer: Self-pay | Admitting: Student

## 2021-02-12 ENCOUNTER — Other Ambulatory Visit: Payer: Self-pay | Admitting: Cardiology

## 2021-02-12 NOTE — Telephone Encounter (Signed)
This is Dr. Myles Gip pt

## 2021-02-12 NOTE — Telephone Encounter (Signed)
Prescription refill request for Xarelto received.  Indication: PAF Last office visit: 02/15/20  B Strader PA-C Weight: 68kg Age: 60 Scr: 0.83 on 12/26/20 CrCl: 76.41  Based on above findings Xarelto 20mg  daily is the appropriate dose.  Refill approved x 2.  Pt is due for f/u appt with Dr Domenic Polite.  Message sent to [employer].

## 2021-03-04 ENCOUNTER — Telehealth: Payer: Self-pay | Admitting: *Deleted

## 2021-03-04 NOTE — Telephone Encounter (Signed)
Pt approved for [employer] ( Xarelto) patient assistance.

## 2021-03-12 ENCOUNTER — Telehealth (INDEPENDENT_AMBULATORY_CARE_PROVIDER_SITE_OTHER): Payer: Self-pay

## 2021-03-12 ENCOUNTER — Other Ambulatory Visit (INDEPENDENT_AMBULATORY_CARE_PROVIDER_SITE_OTHER): Payer: Self-pay

## 2021-03-12 ENCOUNTER — Encounter (INDEPENDENT_AMBULATORY_CARE_PROVIDER_SITE_OTHER): Payer: Self-pay

## 2021-03-12 DIAGNOSIS — Z1211 Encounter for screening for malignant neoplasm of colon: Secondary | ICD-10-CM

## 2021-03-12 MED ORDER — PEG 3350-KCL-NA BICARB-NACL 420 G PO SOLR: 4000.0000 mL | ORAL | 0 refills | Status: DC

## 2021-03-12 NOTE — Telephone Encounter (Signed)
LeighAnn Lateef Juncaj, CMA  

## 2021-03-12 NOTE — Telephone Encounter (Signed)
Referring MD/PCP: Moshe Cipro  Procedure: tcs  Reason/Indication:  Screening  Has patient had this procedure before?  Yes, 10 yrs ago  If so, when, by whom and where?    Is there a family history of colon cancer?  no  Who?  What age when diagnosed?    Is patient diabetic? If yes, Type 1 or Type 2   no      Does patient have prosthetic heart valve or mechanical valve?  no  Do you have a pacemaker/defibrillator?  no  Has patient ever had endocarditis/atrial fibrillation? no  Does patient use oxygen? no  Has patient had joint replacement within last 12 months?  no  Is patient constipated or do they take laxatives? no  Does patient have a history of alcohol/drug use?  no  Have you had a stroke/heart attack last 6 mths? no  Do you take medicine for weight loss?  no  For female patients,: do you still have your menstrual cycle? no  Is patient on blood thinner such as Coumadin, Plavix and/or Aspirin? yes  Medications: Xarelto 20 mg daily, metoprolol 25 mg daily fenofibrate 54 mg daily,  Spironolactone 25 mg (12.5 mg) daily  Allergies: eliquis, pravastatin   Medication Adjustment per Dr Laural Golden  Angela Cox 2 DAYS PRIOR TO PROCEDURE  Procedure date & time: Wednesday 04/02/21 at 1:05

## 2021-03-13 ENCOUNTER — Telehealth: Payer: Self-pay | Admitting: *Deleted

## 2021-03-13 ENCOUNTER — Encounter (INDEPENDENT_AMBULATORY_CARE_PROVIDER_SITE_OTHER): Payer: Self-pay

## 2021-03-13 NOTE — Telephone Encounter (Addendum)
° °  Name: Cindy Weiss  DOB: Jul 02, 1960  MRN: [id number]  Primary Cardiologist: Rozann Lesches, MD  Chart reviewed as part of pre-operative protocol coverage. Because of Cindy Weiss's past medical history and time since last visit, she will require a follow-up visit in order to better assess preoperative cardiovascular risk. Notes indicate this is for screening colonoscopy. Last OV was >1 year ago in 01/2020.  Pre-op covering staff: - Please schedule appointment and call patient to inform them. - Please contact requesting surgeon's office via preferred method (i.e, phone, fax) to inform them of need for appointment prior to surgery.  If applicable, this message will also be routed to pharmacy pool for input on holding anticoagulant/antiplatelet agent as requested below so that this information is available to the clearing provider at time of patient's appointment.   Charlie Pitter, PA-C  03/13/2021, 1:12 PM

## 2021-03-13 NOTE — Telephone Encounter (Signed)
° °  Pre-operative Risk Assessment    Patient Name: Cindy Weiss  DOB: April 18, 1960 MRN: [id number]      Request for Surgical Clearance    Procedure:   COLONOSCOPY  Date of Surgery:  Clearance 04/02/21                                 Surgeon:  DR. Hildred Laser Surgeon's Group or Practice Name:  [facility] Phone number:  [phone] Fax number:  [phone]   Type of Clearance Requested:   - Medical  - Pharmacy:  Hold Rivaroxaban (Xarelto) x 2 DAYS PRIOR   Type of Anesthesia:  MAC   Additional requests/questions:    Jiles Prows   03/13/2021, 10:58 AM

## 2021-03-13 NOTE — Telephone Encounter (Signed)
I am going to reach out to [facility] scheduling team to please help in seeing where we can get the pt in for pre op appt.

## 2021-03-13 NOTE — Telephone Encounter (Signed)
Pt has been scheduled to see Dr. Domenic Polite 03/21/21 for pre op clearance. I will forward notes to MD for upcoming appt. Will send FYI to requesting office pt has appt 03/21/21.

## 2021-03-14 NOTE — Telephone Encounter (Signed)
Patient with diagnosis of atrial fibrillation on Xarelto for anticoagulation.    Procedure: colonoscopy Date of procedure: 04/02/21   CHA2DS2-VASc Score = 2   This indicates a 2.2% annual risk of stroke. The patient's score is based upon: CHF History: 0 HTN History: 1 Diabetes History: 0 Stroke History: 0 Vascular Disease History: 0 Age Score: 0 Gender Score: 1       CrCl 68 Platelet count 282  Per office protocol, patient can hold Xarelto for 2 days prior to procedure.   Patient will not need bridging with Lovenox (enoxaparin) around procedure.

## 2021-03-20 ENCOUNTER — Encounter: Payer: Self-pay | Admitting: Cardiology

## 2021-03-20 NOTE — Progress Notes (Signed)
Cardiology Office Note  Date: 03/21/2021   ID: Cindy Weiss, DOB 06-26-1960, MRN [id number]  PCP:  Fayrene Helper, MD  Cardiologist:  Rozann Lesches, MD Electrophysiologist:  None   Chief Complaint  Patient presents with   Cardiac follow-up    History of Present Illness: Cindy Weiss is a 60 y.o. female last seen in November 2021 by Ms. Strader PA-C.  She presents to the office for a preoperative cardiac evaluation.  Per review of the chart she is being considered for a screening colonoscopy which is low risk from a cardiac perspective.  She is here today with her father.  She reports only a rare sense of palpitations from time to time, no prolonged events.  Also a musculoskeletal sounding right thoracic discomfort.  She works at a daycare and lifts toddlers regularly.  She is on Xarelto for stroke prophylaxis with paroxysmal atrial fibrillation and CHA2DS2-VASc score of 2.  She would need to hold Xarelto 48 hours prior to colonoscopy and does not require Lovenox bridging.  I personally reviewed her ECG today which shows normal sinus rhythm.  Past Medical History:  Diagnosis Date   Anemia    Fibroids    Hyperlipidemia    Hypertension    Impaired glucose tolerance    PAF (paroxysmal atrial fibrillation) ([place])    a. diagnosed in 03/2017. Spontaneous conversion to NSR     Past Surgical History:  Procedure Laterality Date   BREAST EXCISIONAL BIOPSY Left    benign   MYOMECTOMY      Current Outpatient Medications  Medication Sig Dispense Refill   fenofibrate 54 MG tablet TAKE 1 TABLET(54 MG) BY MOUTH DAILY 30 tablet 5   metoprolol succinate (TOPROL-XL) 25 MG 24 hr tablet TAKE 1 TABLET(25 MG) BY MOUTH DAILY 30 tablet 3   spironolactone (ALDACTONE) 25 MG tablet Take 0.5 tablets (12.5 mg total) by mouth daily. 15 tablet 3   XARELTO 20 MG TABS tablet TAKE 1 TABLET(20 MG) BY MOUTH DAILY WITH SUPPER 30 tablet 1   No current facility-administered medications for  this visit.   Allergies:  Eliquis [apixaban] and Pravastatin   ROS: No orthopnea or PND.  No syncope.  Physical Exam: VS:  BP 132/86    Pulse 75    Ht 5\' 4"  (1.626 m)    Wt 150 lb 9.6 oz (68.3 kg)    BMI 25.85 kg/m , BMI Body mass index is 25.85 kg/m.  Wt Readings from Last 3 Encounters:  03/21/21 150 lb 9.6 oz (68.3 kg)  12/26/20 153 lb (69.4 kg)  08/22/20 148 lb (67.1 kg)    General: Patient appears comfortable at rest. HEENT: Conjunctiva and lids normal, wearing a mask. Neck: Supple, no elevated JVP or carotid bruits, no thyromegaly. Lungs: Clear to auscultation, nonlabored breathing at rest. Cardiac: Regular rate and rhythm, no S3 or significant systolic murmur, no pericardial rub. Extremities: No pitting edema.  ECG:  An ECG dated 02/15/2020 was personally reviewed today and demonstrated:  Sinus rhythm.  Recent Labwork: 12/26/2020: ALT 19; AST 20; BUN 15; Creatinine, Ser 0.83; Hemoglobin 11.8; Platelets 282; Potassium 3.7; Sodium 137     Component Value Date/Time   CHOL 213 (H) 12/26/2020 1413   TRIG 40 12/26/2020 1413   HDL 86 12/26/2020 1413   CHOLHDL 2.5 12/26/2020 1413   VLDL 8 12/26/2020 1413   LDLCALC 119 (H) 12/26/2020 1413   LDLCALC 137 (H) 01/04/2019 1633    Other Studies Reviewed Today:  Echocardiogram  04/08/2017: - Left ventricle: The cavity size was normal. Wall thickness was    normal. Systolic function was normal. The estimated ejection    fraction was in the range of 60% to 65%. Wall motion was normal;    there were no regional wall motion abnormalities. Left    ventricular diastolic function parameters were normal.  - Mitral valve: There was mild regurgitation.  - Right atrium: Central venous pressure (est): 3 mm Hg.  - Atrial septum: No defect or patent foramen ovale was identified.  - Tricuspid valve: There was trivial regurgitation.  - Pulmonary arteries: Systolic pressure could not be accurately    estimated.  - Pericardium, extracardiac:  There was no pericardial effusion.   Impressions:   - Normal LV wall thickness with LVEF 60-65% and grossly normal    diastolic function. Mild mitral regurgitation. Trivial tricuspid    regurgitation.  Assessment and Plan:  1.  Paroxysmal atrial fibrillation with CHA2DS2-VASc score of 2.  She is doing well symptomatically and is in sinus rhythm today by ECG.  Continue Toprol-XL and Xarelto.  Keep follow-up with Dr. Moshe Cipro for interval lab work, I reviewed results from September.  2.  Patient pending screening colonoscopy, low risk from a cardiac perspective.  Xarelto should be held 48 hours prior to the procedure, she does not require bridging with Lovenox.  3.  Essential hypertension, on Aldactone in addition to Toprol-XL with follow-up by Dr. Moshe Cipro.  Medication Adjustments/Labs and Tests Ordered: Current medicines are reviewed at length with the patient today.  Concerns regarding medicines are outlined above.   Tests Ordered: Orders Placed This Encounter  Procedures   EKG 12-Lead    Medication Changes: No orders of the defined types were placed in this encounter.   Disposition:  Follow up  1 year.  Signed, Satira Sark, MD, [facility] 03/21/2021 10:08 AM    [facility] [place], [place], [place] [address] Phone: [phone]; Fax: [phone]

## 2021-03-21 ENCOUNTER — Ambulatory Visit (INDEPENDENT_AMBULATORY_CARE_PROVIDER_SITE_OTHER): Payer: Self-pay | Admitting: Cardiology

## 2021-03-21 ENCOUNTER — Ambulatory Visit (HOSPITAL_COMMUNITY)
Admission: RE | Admit: 2021-03-21 | Discharge: 2021-03-21 | Disposition: A | Discharge status: Home / Self Care | Payer: Self-pay | Source: Ambulatory Visit | Attending: Family Medicine | Admitting: Family Medicine

## 2021-03-21 ENCOUNTER — Encounter: Payer: Self-pay | Admitting: Cardiology

## 2021-03-21 ENCOUNTER — Other Ambulatory Visit: Payer: Self-pay

## 2021-03-21 VITALS — BP 132/86 | HR 75 | Ht 64.0 in | Wt 150.6 lb

## 2021-03-21 DIAGNOSIS — I10 Essential (primary) hypertension: Secondary | ICD-10-CM

## 2021-03-21 DIAGNOSIS — Z1231 Encounter for screening mammogram for malignant neoplasm of breast: Secondary | ICD-10-CM | POA: Insufficient documentation

## 2021-03-21 DIAGNOSIS — I480 Paroxysmal atrial fibrillation: Secondary | ICD-10-CM

## 2021-03-21 NOTE — Patient Instructions (Signed)

## 2021-03-26 NOTE — Patient Instructions (Signed)
Cindy Weiss  03/26/2021     @PREFPERIOPPHARMACY @   Your procedure is scheduled on 04/02/2021.   Report to [facility] at 1130 A.M.   Call this number if you have problems the morning of surgery:  [phone]   Remember:  Follow the diet and prep instructions given to you by the office.    YOur last dose of xarelto should be    Take these medicines the morning of surgery with A SIP OF WATER                               Metoprolol.     Do not wear jewelry, make-up or nail polish.  Do not wear lotions, powders, or perfumes, or deodorant.  Do not shave 48 hours prior to surgery.  Men may shave face and neck.  Do not bring valuables to the hospital.  [facility] is not responsible for any belongings or valuables.  Contacts, dentures or bridgework may not be worn into surgery.  Leave your suitcase in the car.  After surgery it may be brought to your room.  For patients admitted to the hospital, discharge time will be determined by your treatment team.  Patients discharged the day of surgery will not be allowed to drive home and must have someone with them for 24 hours.    Special instructions:   DO NOT smoke tobacco or vape for 24 hours before your procedure.  Please read over the following fact sheets that you were given. Anesthesia Post-op Instructions and Care and Recovery After Surgery      Colonoscopy, Adult, Care After This sheet gives you information about how to care for yourself after your procedure. Your health care provider may also give you more specific instructions. If you have problems or questions, contact your health care provider. What can I expect after the procedure? After the procedure, it is common to have: A small amount of blood in your stool for 24 hours after the procedure. Some gas. Mild cramping or bloating of your abdomen. Follow these instructions at home: Eating and drinking  Drink enough fluid to keep your urine pale  yellow. Follow instructions from your health care provider about eating or drinking restrictions. Resume your normal diet as instructed by your health care provider. Avoid heavy or fried foods that are hard to digest. Activity Rest as told by your health care provider. Avoid sitting for a long time without moving. Get up to take short walks every 1-2 hours. This is important to improve blood flow and breathing. Ask for help if you feel weak or unsteady. Return to your normal activities as told by your health care provider. Ask your health care provider what activities are safe for you. Managing cramping and bloating  Try walking around when you have cramps or feel bloated. Apply heat to your abdomen as told by your health care provider. Use the heat source that your health care provider recommends, such as a moist heat pack or a heating pad. Place a towel between your skin and the heat source. Leave the heat on for 20-30 minutes. Remove the heat if your skin turns bright red. This is especially important if you are unable to feel pain, heat, or cold. You may have a greater risk of getting burned. General instructions If you were given a sedative during the procedure, it can affect you for several hours.  Do not drive or operate machinery until your health care provider says that it is safe. For the first 24 hours after the procedure: Do not sign important documents. Do not drink alcohol. Do your regular daily activities at a slower pace than normal. Eat soft foods that are easy to digest. Take over-the-counter and prescription medicines only as told by your health care provider. Keep all follow-up visits as told by your health care provider. This is important. Contact a health care provider if: You have blood in your stool 2-3 days after the procedure. Get help right away if you have: More than a small spotting of blood in your stool. Large blood clots in your stool. Swelling of your  abdomen. Nausea or vomiting. A fever. Increasing pain in your abdomen that is not relieved with medicine. Summary After the procedure, it is common to have a small amount of blood in your stool. You may also have mild cramping and bloating of your abdomen. If you were given a sedative during the procedure, it can affect you for several hours. Do not drive or operate machinery until your health care provider says that it is safe. Get help right away if you have a lot of blood in your stool, nausea or vomiting, a fever, or increased pain in your abdomen. This information is not intended to replace advice given to you by your health care provider. Make sure you discuss any questions you have with your health care provider. Document Revised: 01/20/2019 Document Reviewed: 10/10/2018 Elsevier Patient Education  [place] After This sheet gives you information about how to care for yourself after your procedure. Your health care provider may also give you more specific instructions. If you have problems or questions, contact your health care provider. What can I expect after the procedure? After the procedure, it is common to have: Tiredness. Forgetfulness about what happened after the procedure. Impaired judgment for important decisions. Nausea or vomiting. Some difficulty with balance. Follow these instructions at home: For the time period you were told by your health care provider:   Rest as needed. Do not participate in activities where you could fall or become injured. Do not drive or use machinery. Do not drink alcohol. Do not take sleeping pills or medicines that cause drowsiness. Do not make important decisions or sign legal documents. Do not take care of children on your own. Eating and drinking Follow the diet that is recommended by your health care provider. Drink enough fluid to keep your urine pale yellow. If you vomit: Drink water,  juice, or soup when you can drink without vomiting. Make sure you have little or no nausea before eating solid foods. General instructions Have a responsible adult stay with you for the time you are told. It is important to have someone help care for you until you are awake and alert. Take over-the-counter and prescription medicines only as told by your health care provider. If you have sleep apnea, surgery and certain medicines can increase your risk for breathing problems. Follow instructions from your health care provider about wearing your sleep device: Anytime you are sleeping, including during daytime naps. While taking prescription pain medicines, sleeping medicines, or medicines that make you drowsy. Avoid smoking. Keep all follow-up visits as told by your health care provider. This is important. Contact a health care provider if: You keep feeling nauseous or you keep vomiting. You feel light-headed. You are still sleepy or having trouble with balance  after 24 hours. You develop a rash. You have a fever. You have redness or swelling around the IV site. Get help right away if: You have trouble breathing. You have new-onset confusion at home. Summary For several hours after your procedure, you may feel tired. You may also be forgetful and have poor judgment. Have a responsible adult stay with you for the time you are told. It is important to have someone help care for you until you are awake and alert. Rest as told. Do not drive or operate machinery. Do not drink alcohol or take sleeping pills. Get help right away if you have trouble breathing, or if you suddenly become confused. This information is not intended to replace advice given to you by your health care provider. Make sure you discuss any questions you have with your health care provider. Document Revised: 11/30/2019 Document Reviewed: 02/16/2019 Elsevier Patient Education  2022 [employer].

## 2021-03-28 ENCOUNTER — Other Ambulatory Visit: Payer: Self-pay

## 2021-03-28 ENCOUNTER — Encounter (HOSPITAL_COMMUNITY)
Admission: RE | Admit: 2021-03-28 | Discharge: 2021-03-28 | Disposition: A | Discharge status: Home / Self Care | Payer: Self-pay | Source: Ambulatory Visit | Attending: Internal Medicine | Admitting: Internal Medicine

## 2021-03-28 ENCOUNTER — Encounter (HOSPITAL_COMMUNITY): Payer: Self-pay

## 2021-03-28 DIAGNOSIS — Z1211 Encounter for screening for malignant neoplasm of colon: Secondary | ICD-10-CM

## 2021-03-28 DIAGNOSIS — Z01812 Encounter for preprocedural laboratory examination: Secondary | ICD-10-CM | POA: Insufficient documentation

## 2021-03-28 DIAGNOSIS — D509 Iron deficiency anemia, unspecified: Secondary | ICD-10-CM

## 2021-03-28 HISTORY — DX: Cardiac arrhythmia, unspecified: I49.9

## 2021-03-28 LAB — CBC WITH DIFFERENTIAL/PLATELET
Abs Immature Granulocytes: 0.03 10*3/uL (ref 0.00–0.07)
Basophils Absolute: 0 10*3/uL (ref 0.0–0.1)
Basophils Relative: 0 %
Eosinophils Absolute: 0.1 10*3/uL (ref 0.0–0.5)
Eosinophils Relative: 1 %
HCT: 36.8 % (ref 36.0–46.0)
Hemoglobin: 11.7 g/dL — ABNORMAL LOW (ref 12.0–15.0)
Immature Granulocytes: 0 %
Lymphocytes Relative: 24 %
Lymphs Abs: 2.1 10*3/uL (ref 0.7–4.0)
MCH: 22.1 pg — ABNORMAL LOW (ref 26.0–34.0)
MCHC: 31.8 g/dL (ref 30.0–36.0)
MCV: 69.4 fL — ABNORMAL LOW (ref 80.0–100.0)
Monocytes Absolute: 0.5 10*3/uL (ref 0.1–1.0)
Monocytes Relative: 5 %
Neutro Abs: 6.3 10*3/uL (ref 1.7–7.7)
Neutrophils Relative %: 70 %
Platelets: 267 10*3/uL (ref 150–400)
RBC: 5.3 MIL/uL — ABNORMAL HIGH (ref 3.87–5.11)
RDW: 14.8 % (ref 11.5–15.5)
WBC: 9 10*3/uL (ref 4.0–10.5)
nRBC: 0 % (ref 0.0–0.2)

## 2021-03-28 LAB — BASIC METABOLIC PANEL
Anion gap: 7 (ref 5–15)
BUN: 16 mg/dL (ref 8–23)
CO2: 27 mmol/L (ref 22–32)
Calcium: 9.7 mg/dL (ref 8.9–10.3)
Chloride: 104 mmol/L (ref 98–111)
Creatinine, Ser: 0.75 mg/dL (ref 0.44–1.00)
GFR, Estimated: >60 mL/min (ref >60)
Glucose, Bld: 93 mg/dL (ref 70–99)
Potassium: 4.6 mmol/L (ref 3.5–5.1)
Sodium: 138 mmol/L (ref 135–145)

## 2021-04-01 ENCOUNTER — Ambulatory Visit (INDEPENDENT_AMBULATORY_CARE_PROVIDER_SITE_OTHER): Payer: Self-pay | Admitting: Family Medicine

## 2021-04-01 ENCOUNTER — Other Ambulatory Visit: Payer: Self-pay

## 2021-04-01 ENCOUNTER — Encounter: Payer: Self-pay | Admitting: Family Medicine

## 2021-04-01 VITALS — BP 140/92 | HR 70 | Resp 16 | Ht 64.0 in | Wt 152.1 lb

## 2021-04-01 DIAGNOSIS — E663 Overweight: Secondary | ICD-10-CM

## 2021-04-01 DIAGNOSIS — I480 Paroxysmal atrial fibrillation: Secondary | ICD-10-CM

## 2021-04-01 DIAGNOSIS — I10 Essential (primary) hypertension: Secondary | ICD-10-CM

## 2021-04-01 DIAGNOSIS — E785 Hyperlipidemia, unspecified: Secondary | ICD-10-CM

## 2021-04-01 MED ORDER — FENOFIBRATE 54 MG PO TABS: 54.0000 mg | ORAL_TABLET | Freq: Every day | ORAL | 11 refills | Status: DC

## 2021-04-01 MED ORDER — SPIRONOLACTONE 25 MG PO TABS: ORAL_TABLET | ORAL | 11 refills | Status: DC

## 2021-04-01 MED ORDER — METOPROLOL SUCCINATE ER 25 MG PO TB24: 25.0000 mg | ORAL_TABLET | Freq: Every day | ORAL | 11 refills | Status: DC

## 2021-04-01 NOTE — Patient Instructions (Addendum)
Annual exam first week in October, call if you need me before  1 year of medication is prescribed  Fasting lipid, cmp and EGFR [facility]  Need to take BP meds every day to control your blood pressure  All the best with colonscopy  Hold cholesterol med tonight  Thanks for choosing [facility], we consider it a privelige to serve you.

## 2021-04-01 NOTE — Assessment & Plan Note (Signed)
Uncontrolled, no taking medication every day, importance of same is stressed DASH diet and commitment to daily physical activity for a minimum of 30 minutes discussed and encouraged, as a part of hypertension management. The importance of attaining a healthy weight is also discussed.  BP/Weight 04/01/2021 03/21/2021 12/26/2020 08/22/2020 03/19/2020 02/15/2020 16/09/3708  Systolic BP 626 948 546 270 350 093 818  Diastolic BP 92 86 95 90 90 88 94  Wt. (Lbs) 152.12 150.6 153 148 147.7 150 153.04  BMI 26.11 25.85 26.26 25.4 25.35 25.75 26.27

## 2021-04-01 NOTE — Assessment & Plan Note (Signed)
Rate controlled , remain on xarelto

## 2021-04-01 NOTE — Assessment & Plan Note (Signed)
°  Patient re-educated about  the importance of commitment to a  minimum of 150 minutes of exercise per week as able.  The importance of healthy food choices with portion control discussed, as well as eating regularly and within a 12 hour window most days. The need to choose "clean , green" food 50 to 75% of the time is discussed, as well as to make water the primary drink and set a goal of 64 ounces water daily.    Weight /BMI 04/01/2021 03/21/2021 12/26/2020  WEIGHT 152 lb 1.9 oz 150 lb 9.6 oz 153 lb  HEIGHT 5\' 4"  5\' 4"  5\' 4"   BMI 26.11 kg/m2 25.85 kg/m2 26.26 kg/m2

## 2021-04-01 NOTE — Progress Notes (Signed)
Cindy Weiss     MRN: [id number]      DOB: [birth year]   HPI Cindy Weiss is here for follow up and re-evaluation of chronic medical conditions, medication management and review of any available recent lab and radiology data.  Preventive health is updated, specifically  Cancer screening and Immunization.  Has colonoscopy in am Questions or concerns regarding consultations or procedures which the PT has had in the interim are  addressed. The PT denies any adverse reactions to current medications since the last visit.  There are no new concerns.  There are no specific complaints   ROS Denies recent fever or chills. Denies sinus pressure, nasal congestion, ear pain or sore throat. Denies chest congestion, productive cough or wheezing. Denies chest pains, palpitations and leg swelling Denies abdominal pain, nausea, vomiting,diarrhea or constipation.   Denies dysuria, frequency, hesitancy or incontinence. Denies joint pain, swelling and limitation in mobility. Denies headaches, seizures, numbness, or tingling. Denies depression, anxiety or insomnia. Denies skin break down or rash.   PE  BP (!) 140/92    Pulse 70    Resp 16    Ht 5\' 4"  (1.626 m)    Wt 152 lb 1.9 oz (69 kg)    SpO2 98%    BMI 26.11 kg/m   Patient alert and oriented and in no cardiopulmonary distress.  HEENT: No facial asymmetry, EOMI,     Neck supple .  Chest: Clear to auscultation bilaterally.  CVS: S1, S2 no murmurs, no S3.Regular rate.  ABD: Soft non tender.   Ext: No edema  MS: Adequate ROM spine, shoulders, hips and knees.  Skin: Intact, no ulcerations or rash noted.  Psych: Good eye contact, normal affect. Memory intact not anxious or depressed appearing.  CNS: CN 2-12 intact, power,  normal throughout.no focal deficits noted.   Assessment & Plan  HTN (hypertension) Uncontrolled, no taking medication every day, importance of same is stressed DASH diet and commitment to daily physical activity  for a minimum of 30 minutes discussed and encouraged, as a part of hypertension management. The importance of attaining a healthy weight is also discussed.  BP/Weight 04/01/2021 03/21/2021 12/26/2020 08/22/2020 03/19/2020 02/15/2020 78/24/2353  Systolic BP 614 431 540 086 761 950 932  Diastolic BP 92 86 95 90 90 88 94  Wt. (Lbs) 152.12 150.6 153 148 147.7 150 153.04  BMI 26.11 25.85 26.26 25.4 25.35 25.75 26.27       Hyperlipidemia Hyperlipidemia:Low fat diet discussed and encouraged.   Lipid Panel  Lab Results  Component Value Date   CHOL 213 (H) 12/26/2020   HDL 86 12/26/2020   LDLCALC 119 (H) 12/26/2020   TRIG 40 12/26/2020   CHOLHDL 2.5 12/26/2020     Not at goal Updated lab needed at/ before next visit.   Paroxysmal atrial fibrillation (HCC) Rate controlled , remain on xarelto  Overweight  Patient re-educated about  the importance of commitment to a  minimum of 150 minutes of exercise per week as able.  The importance of healthy food choices with portion control discussed, as well as eating regularly and within a 12 hour window most days. The need to choose "clean , green" food 50 to 75% of the time is discussed, as well as to make water the primary drink and set a goal of 64 ounces water daily.    Weight /BMI 04/01/2021 03/21/2021 12/26/2020  WEIGHT 152 lb 1.9 oz 150 lb 9.6 oz 153 lb  HEIGHT 5\' 4"  5\' 4"   5\' 4"   BMI 26.11 kg/m2 25.85 kg/m2 26.26 kg/m2

## 2021-04-01 NOTE — Assessment & Plan Note (Signed)
Hyperlipidemia:Low fat diet discussed and encouraged.   Lipid Panel  Lab Results  Component Value Date   CHOL 213 (H) 12/26/2020   HDL 86 12/26/2020   LDLCALC 119 (H) 12/26/2020   TRIG 40 12/26/2020   CHOLHDL 2.5 12/26/2020     Not at goal Updated lab needed at/ before next visit.

## 2021-04-02 ENCOUNTER — Ambulatory Visit (HOSPITAL_COMMUNITY): Payer: Self-pay | Admitting: Anesthesiology

## 2021-04-02 ENCOUNTER — Encounter (HOSPITAL_COMMUNITY): Payer: Self-pay | Admitting: Internal Medicine

## 2021-04-02 ENCOUNTER — Ambulatory Visit (HOSPITAL_COMMUNITY)
Admission: RE | Admit: 2021-04-02 | Discharge: 2021-04-02 | Disposition: A | Discharge status: Home / Self Care | Payer: Self-pay | Attending: Internal Medicine | Admitting: Internal Medicine

## 2021-04-02 ENCOUNTER — Encounter (HOSPITAL_COMMUNITY)
Admission: RE | Disposition: A | Discharge status: Home / Self Care | Payer: Self-pay | Source: Home / Self Care | Attending: Internal Medicine

## 2021-04-02 DIAGNOSIS — K644 Residual hemorrhoidal skin tags: Secondary | ICD-10-CM | POA: Insufficient documentation

## 2021-04-02 DIAGNOSIS — I10 Essential (primary) hypertension: Secondary | ICD-10-CM | POA: Insufficient documentation

## 2021-04-02 DIAGNOSIS — I480 Paroxysmal atrial fibrillation: Secondary | ICD-10-CM | POA: Insufficient documentation

## 2021-04-02 DIAGNOSIS — K648 Other hemorrhoids: Secondary | ICD-10-CM

## 2021-04-02 DIAGNOSIS — Z1211 Encounter for screening for malignant neoplasm of colon: Secondary | ICD-10-CM

## 2021-04-02 DIAGNOSIS — K625 Hemorrhage of anus and rectum: Secondary | ICD-10-CM

## 2021-04-02 DIAGNOSIS — D123 Benign neoplasm of transverse colon: Secondary | ICD-10-CM | POA: Insufficient documentation

## 2021-04-02 HISTORY — PX: COLONOSCOPY WITH PROPOFOL: SHX5780

## 2021-04-02 HISTORY — PX: POLYPECTOMY: SHX5525

## 2021-04-02 HISTORY — PX: BIOPSY: SHX5522

## 2021-04-02 LAB — HM COLONOSCOPY

## 2021-04-02 SURGERY — COLONOSCOPY WITH PROPOFOL
Anesthesia: General

## 2021-04-02 MED ORDER — PROPOFOL 10 MG/ML IV BOLUS
INTRAVENOUS | Status: AC
  Filled 2021-04-02: qty 60

## 2021-04-02 MED ORDER — PROPOFOL 10 MG/ML IV BOLUS
INTRAVENOUS | Status: DC | PRN
  Administered 2021-04-02: 50 mg via INTRAVENOUS

## 2021-04-02 MED ORDER — LACTATED RINGERS IV SOLN: INTRAVENOUS | Status: DC | PRN

## 2021-04-02 MED ORDER — LACTATED RINGERS IV SOLN: INTRAVENOUS | Status: DC

## 2021-04-02 MED ORDER — PROPOFOL 500 MG/50ML IV EMUL
INTRAVENOUS | Status: DC | PRN
  Administered 2021-04-02: 150 ug/kg/min via INTRAVENOUS

## 2021-04-02 NOTE — Op Note (Signed)
[facility] Patient Name: Cindy Weiss Procedure Date: 04/02/2021 1:04 PM MRN: [id number] Date of Birth: July 19, 1960 Attending MD: Hildred Laser , MD CSN: [id number] Age: 61 Admit Type: Outpatient Procedure:                Colonoscopy Indications:              Screening for colorectal malignant neoplasm Providers:                Hildred Laser, MD, Charlsie Quest. Theda Sers RN, RN, Rosina Lowenstein, RN, Raphael Gibney, Technician Referring MD:             Tula Nakayama, MD Medicines:                Propofol per Anesthesia Complications:            No immediate complications. Estimated Blood Loss:     Estimated blood loss was minimal. Procedure:                Pre-Anesthesia Assessment:                           - Prior to the procedure, a History and Physical                            was performed, and patient medications and                            allergies were reviewed. The patient's tolerance of                            previous anesthesia was also reviewed. The risks                            and benefits of the procedure and the sedation                            options and risks were discussed with the patient.                            All questions were answered, and informed consent                            was obtained. Prior Anticoagulants: The patient                            last took Xarelto (rivaroxaban) 3 days prior to the                            procedure. ASA Grade Assessment: II - A patient                            with mild systemic disease. After reviewing the  risks and benefits, the patient was deemed in                            satisfactory condition to undergo the procedure.                           After obtaining informed consent, the colonoscope                            was passed under direct vision. Throughout the                            procedure, the patient's blood pressure,  pulse, and                            oxygen saturations were monitored continuously. The                            [id number]-[id number] ([id number]) scope was introduced through                            the anus and advanced to the the cecum, identified                            by appendiceal orifice and ileocecal valve. The                            colonoscopy was somewhat difficult due to a                            redundant colon. Successful completion of the                            procedure was aided by changing the patient to a                            supine position. The patient tolerated the                            procedure well. The quality of the bowel                            preparation was good. The ileocecal valve,                            appendiceal orifice, and rectum were photographed. Scope In: 1:23:47 PM Scope Out: 1:56:36 PM Scope Withdrawal Time: 0 hours 14 minutes 39 seconds  Total Procedure Duration: 0 hours 32 minutes 49 seconds  Findings:      Skin tags were found on perianal exam.      A 4 mm polyp was found in the hepatic flexure. The polyp was removed       with a hot snare. Resection and retrieval were complete. The pathology       specimen was placed  into Bottle Number 1.      A small polyp was found in the hepatic flexure. The polyp was sessile.       Biopsies were taken with a cold forceps for histology. The pathology       specimen was placed into Bottle Number 1.      A small polyp was found in the hepatic flexure. The polyp was flat.       Biopsies were taken with a cold forceps for histology. The pathology       specimen was placed into Bottle Number 2.      External hemorrhoids were found during retroflexion. The hemorrhoids       were medium-sized. Impression:               - Perianal skin tags found on perianal exam.                           - One 4 mm polyp at the hepatic flexure, removed                            with a hot snare.  Resected and retrieved.                           - One small polyp at the hepatic flexure. Biopsied.                           - One small polyp at the hepatic flexure. Biopsied.                           - External hemorrhoids. Moderate Sedation:      Per Anesthesia Care Recommendation:           - Patient has a contact number available for                            emergencies. The signs and symptoms of potential                            delayed complications were discussed with the                            patient. Return to normal activities tomorrow.                            Written discharge instructions were provided to the                            patient.                           - Resume previous diet today.                           - Continue present medications.                           - Await pathology results.                           -  Resume Xarelto (rivaroxaban) at prior dose                            tomorrow.                           - Repeat colonoscopy is recommended. The                            colonoscopy date will be determined after pathology                            results from today's exam become available for                            review. Procedure Code(s):        --- Professional ---                           [phone], Colonoscopy, flexible; with removal of                            tumor(s), polyp(s), or other lesion(s) by snare                            technique                           45380, 40, Colonoscopy, flexible; with biopsy,                            single or multiple Diagnosis Code(s):        --- Professional ---                           K64.4, Residual hemorrhoidal skin tags                           Z12.11, Encounter for screening for malignant                            neoplasm of colon                           K63.5, Polyp of colon CPT copyright 2019 American Medical Association. All rights reserved. The  codes documented in this report are preliminary and upon coder review may  be revised to meet current compliance requirements. Hildred Laser, MD Hildred Laser, MD 04/02/2021 2:04:49 PM This report has been signed electronically. Number of Addenda: 0

## 2021-04-02 NOTE — Transfer of Care (Signed)
Immediate Anesthesia Transfer of Care Note  Patient: Cindy Weiss  Procedure(s) Performed: COLONOSCOPY WITH PROPOFOL POLYPECTOMY BIOPSY  Patient Location: PACU  Anesthesia Type:General  Level of Consciousness: awake, alert , oriented and patient cooperative  Airway & Oxygen Therapy: Patient Spontanous Breathing  Post-op Assessment: Report given to RN, Post -op Vital signs reviewed and stable and Patient moving all extremities X 4  Post vital signs: Reviewed and stable  Last Vitals:  Vitals Value Taken Time  BP    Temp    Pulse    Resp    SpO2      Last Pain:  Vitals:   04/02/21 1321  TempSrc:   PainSc: 0-No pain      Patients Stated Pain Goal: 4 (65/46/50 3546)  Complications: No notable events documented.

## 2021-04-02 NOTE — Discharge Instructions (Signed)
Resume Xarelto tomorrow evening Resume other medications and diet as before. No driving for 24 hours. Physician will call with biopsy results.

## 2021-04-02 NOTE — Anesthesia Preprocedure Evaluation (Signed)
Anesthesia Evaluation  Patient identified by MRN, date of birth, ID band Patient awake    Reviewed: Allergy & Precautions, H&P , NPO status , Patient's Chart, lab work & pertinent test results, reviewed documented beta blocker date and time   Airway Mallampati: II  TM Distance: >3 FB Neck ROM: full    Dental no notable dental hx.    Pulmonary neg pulmonary ROS,    Pulmonary exam normal breath sounds clear to auscultation       Cardiovascular Exercise Tolerance: Good hypertension, + dysrhythmias Atrial Fibrillation  Rhythm:regular Rate:Normal     Neuro/Psych negative neurological ROS  negative psych ROS   GI/Hepatic negative GI ROS, Neg liver ROS,   Endo/Other  negative endocrine ROS  Renal/GU negative Renal ROS  negative genitourinary   Musculoskeletal   Abdominal   Peds  Hematology  (+) Blood dyscrasia, anemia ,   Anesthesia Other Findings Chronic anticoagulation, LD 1/1  Reproductive/Obstetrics negative OB ROS                             Anesthesia Physical Anesthesia Plan  ASA: 3  Anesthesia Plan: General   Post-op Pain Management:    Induction:   PONV Risk Score and Plan: Propofol infusion  Airway Management Planned:   Additional Equipment:   Intra-op Plan:   Post-operative Plan:   Informed Consent: I have reviewed the patients History and Physical, chart, labs and discussed the procedure including the risks, benefits and alternatives for the proposed anesthesia with the patient or authorized representative who has indicated his/her understanding and acceptance.     Dental Advisory Given  Plan Discussed with: CRNA  Anesthesia Plan Comments:         Anesthesia Quick Evaluation

## 2021-04-02 NOTE — H&P (Signed)
Cindy Weiss is an 61 y.o. female.   Chief Complaint: Patient is here for colonoscopy. HPI: Patient is 61 year old African-American female who is here for screening colonoscopy.  Her last exam was normal 10 years ago.  She denies abdominal pain change in bowel habits or rectal bleeding.  Family history is negative for colorectal carcinoma. Patient has history of paroxysmal atrial fibrillation and is on Xarelto which is on hold for the procedure.  Past Medical History:  Diagnosis Date   Anemia    Dysrhythmia    Fibroids    Hyperlipidemia    Hypertension    Impaired glucose tolerance    PAF (paroxysmal atrial fibrillation) ([place])    a. diagnosed in 03/2017. Spontaneous conversion to NSR     Past Surgical History:  Procedure Laterality Date   BREAST EXCISIONAL BIOPSY Left    benign   MYOMECTOMY      Family History  Problem Relation Age of Onset   Hyperlipidemia Mother    Thyroid disease Father    Hypertension Sister    Social History:  reports that she has never smoked. She has never used smokeless tobacco. She reports that she does not drink alcohol and does not use drugs.  Allergies:  Allergies  Allergen Reactions   Eliquis [Apixaban] Rash   Pravastatin     REACTION: Muscle aches    Medications Prior to Admission  Medication Sig Dispense Refill   fenofibrate 54 MG tablet Take 1 tablet (54 mg total) by mouth daily. 30 tablet 11   metoprolol succinate (TOPROL-XL) 25 MG 24 hr tablet Take 1 tablet (25 mg total) by mouth daily. 30 tablet 11   spironolactone (ALDACTONE) 25 MG tablet Take half tablet by mouth every morning for blood pressure 15 tablet 11   XARELTO 20 MG TABS tablet TAKE 1 TABLET(20 MG) BY MOUTH DAILY WITH SUPPER 30 tablet 1    No results found for this or any previous visit (from the past 48 hour(s)). No results found.  Review of Systems  Blood pressure (!) 146/89, resp. rate 11, SpO2 97 %. Physical Exam HENT:     Mouth/Throat:     Mouth: Mucous  membranes are moist.     Pharynx: Oropharynx is clear.  Eyes:     General: No scleral icterus.    Conjunctiva/sclera: Conjunctivae normal.  Cardiovascular:     Rate and Rhythm: Normal rate and regular rhythm.     Heart sounds: Murmur heard.     Comments: Faint faint systolic murmur best heard at aortic area Pulmonary:     Effort: Pulmonary effort is normal.     Breath sounds: Normal breath sounds.  Abdominal:     General: There is no distension.     Palpations: Abdomen is soft. There is no mass.     Tenderness: There is no abdominal tenderness.  Musculoskeletal:        General: No swelling.     Cervical back: Neck supple.  Lymphadenopathy:     Cervical: No cervical adenopathy.  Skin:    General: Skin is warm and dry.  Neurological:     Mental Status: She is alert.     Assessment/Plan  Average risk screening colonoscopy.  Hildred Laser, MD 04/02/2021, 1:15 PM

## 2021-04-03 NOTE — Anesthesia Postprocedure Evaluation (Signed)
Anesthesia Post Note  Patient: Cindy Weiss  Procedure(s) Performed: COLONOSCOPY WITH PROPOFOL POLYPECTOMY BIOPSY  Patient location during evaluation: Phase II Anesthesia Type: General Level of consciousness: awake Pain management: pain level controlled Vital Signs Assessment: post-procedure vital signs reviewed and stable Respiratory status: spontaneous breathing and respiratory function stable Cardiovascular status: blood pressure returned to baseline and stable Postop Assessment: no headache and no apparent nausea or vomiting Anesthetic complications: no Comments: Late entry   No notable events documented.   Last Vitals:  Vitals:   04/02/21 1205 04/02/21 1403  BP: (!) 146/89 115/78  Pulse:  91  Resp: 11 18  Temp:  36.5 C  SpO2: 97% 100%    Last Pain:  Vitals:   04/02/21 1403  TempSrc:   PainSc: 0-No pain                 Louann Sjogren

## 2021-04-04 LAB — SURGICAL PATHOLOGY

## 2021-04-08 ENCOUNTER — Encounter (HOSPITAL_COMMUNITY): Payer: Self-pay | Admitting: Internal Medicine

## 2021-04-11 ENCOUNTER — Encounter (INDEPENDENT_AMBULATORY_CARE_PROVIDER_SITE_OTHER): Payer: Self-pay | Admitting: *Deleted

## 2021-04-17 ENCOUNTER — Other Ambulatory Visit: Payer: Self-pay | Admitting: Cardiology

## 2021-04-18 NOTE — Telephone Encounter (Signed)
Prescription refill request for Xarelto received.  Indication: afib  Last office visit: [place], 03/21/2021 Weight: 69 kg Age: 61 yo  Scr: 0.75, 03/28/2021 CrCl: 85 ml/min   Refill sent.

## 2021-04-28 ENCOUNTER — Telehealth: Payer: Self-pay | Admitting: Orthopedic Surgery

## 2021-04-28 NOTE — Telephone Encounter (Signed)
Call received from patient's employer, [employer], per [place], inquiring about scheduling appointment for a work-related injury and a note of weight limit restrictions from 2019; however, what we have seen patient for is not related to any work injury. Notes indicate that atient was treated elsewhere at that time; states will try calling other provider.

## 2021-05-18 ENCOUNTER — Other Ambulatory Visit: Payer: Self-pay | Admitting: Student

## 2021-06-03 ENCOUNTER — Ambulatory Visit: Payer: Self-pay | Admitting: Cardiology

## 2021-10-02 ENCOUNTER — Telehealth: Payer: Self-pay | Admitting: Cardiology

## 2021-10-02 MED ORDER — RIVAROXABAN 20 MG PO TABS: ORAL_TABLET | ORAL | 5 refills | Status: DC

## 2021-10-02 NOTE — Telephone Encounter (Signed)
Prescription refill request for Xarelto received.  Indication: PAF Last office visit: 03/21/21  Myles Gip MD Weight: 68.3kg Age: 61 Scr: 0.75 on 03/28/21 CrCl: 84.93  Based on above findings Xarelto '20mg'$  daily is the appropriate dose.  Refill approved.

## 2021-10-02 NOTE — Telephone Encounter (Signed)
Need refills on xarelto. She picked up the current bottle on 09/17/2021. Pharmacy told her for future refills she'll need to contact her Doctor.

## 2021-11-05 ENCOUNTER — Telehealth: Payer: Self-pay | Admitting: Family Medicine

## 2021-11-05 NOTE — Telephone Encounter (Signed)
Patient came by office in regard to refills   Patient is requesting 24- day supplies of med  Patient insurance will be ending at the end of the month and is afraid she will not be covered for refills .   fenofibrate 54 MG tablet   spironolactone (ALDACTONE) 25 MG tablet

## 2021-11-06 ENCOUNTER — Other Ambulatory Visit: Payer: Self-pay

## 2021-11-06 MED ORDER — FENOFIBRATE 54 MG PO TABS: 54.0000 mg | ORAL_TABLET | Freq: Every day | ORAL | 2 refills | Status: DC

## 2021-11-06 MED ORDER — SPIRONOLACTONE 25 MG PO TABS: ORAL_TABLET | ORAL | 2 refills | Status: DC

## 2021-12-29 DIAGNOSIS — E785 Hyperlipidemia, unspecified: Secondary | ICD-10-CM | POA: Diagnosis not present

## 2021-12-30 ENCOUNTER — Ambulatory Visit (INDEPENDENT_AMBULATORY_CARE_PROVIDER_SITE_OTHER): Payer: 59 | Admitting: Family Medicine

## 2021-12-30 ENCOUNTER — Encounter: Payer: Self-pay | Admitting: Family Medicine

## 2021-12-30 VITALS — BP 130/84 | HR 75 | Ht 64.0 in | Wt 158.1 lb

## 2021-12-30 DIAGNOSIS — Z1231 Encounter for screening mammogram for malignant neoplasm of breast: Secondary | ICD-10-CM

## 2021-12-30 DIAGNOSIS — I10 Essential (primary) hypertension: Secondary | ICD-10-CM

## 2021-12-30 DIAGNOSIS — E559 Vitamin D deficiency, unspecified: Secondary | ICD-10-CM | POA: Diagnosis not present

## 2021-12-30 DIAGNOSIS — E785 Hyperlipidemia, unspecified: Secondary | ICD-10-CM | POA: Diagnosis not present

## 2021-12-30 DIAGNOSIS — E7849 Other hyperlipidemia: Secondary | ICD-10-CM

## 2021-12-30 DIAGNOSIS — R7303 Prediabetes: Secondary | ICD-10-CM | POA: Diagnosis not present

## 2021-12-30 DIAGNOSIS — Z0000 Encounter for general adult medical examination without abnormal findings: Secondary | ICD-10-CM

## 2021-12-30 LAB — CMP14+EGFR
ALT: 24 IU/L (ref 0–32)
AST: 26 IU/L (ref 0–40)
Albumin/Globulin Ratio: 1.6 (ref 1.2–2.2)
Albumin: 4.9 g/dL (ref 3.9–4.9)
Alkaline Phosphatase: 88 IU/L (ref 44–121)
BUN/Creatinine Ratio: 17 (ref 12–28)
BUN: 15 mg/dL (ref 8–27)
Bilirubin Total: <0.2 mg/dL (ref 0.0–1.2)
CO2: 21 mmol/L (ref 20–29)
Calcium: 10.3 mg/dL (ref 8.7–10.3)
Chloride: 103 mmol/L (ref 96–106)
Creatinine, Ser: 0.89 mg/dL (ref 0.57–1.00)
Globulin, Total: 3 g/dL (ref 1.5–4.5)
Glucose: 113 mg/dL — ABNORMAL HIGH (ref 70–99)
Potassium: 4.4 mmol/L (ref 3.5–5.2)
Sodium: 141 mmol/L (ref 134–144)
Total Protein: 7.9 g/dL (ref 6.0–8.5)
eGFR: 73 mL/min/{1.73_m2} (ref >59)

## 2021-12-30 LAB — LIPID PANEL
Chol/HDL Ratio: 3 ratio (ref 0.0–4.4)
Cholesterol, Total: 229 mg/dL — ABNORMAL HIGH (ref 100–199)
HDL: 77 mg/dL (ref >39)
LDL Chol Calc (NIH): 144 mg/dL — ABNORMAL HIGH (ref 0–99)
Triglycerides: 46 mg/dL (ref 0–149)
VLDL Cholesterol Cal: 8 mg/dL (ref 5–40)

## 2021-12-30 NOTE — Patient Instructions (Addendum)
F/U in 6 months, call if you need me sooner   Please get TdaP at your pharmacy  Nurse pls documet the flu and covid vaccines she got today,   at [facility], paper at your area, I am unable to enter ( return paper to her)  Glycohb today  Please reduce cheese, egg yolk, fried and fatty foods, tG are high  Pls sched mammogram at checkout due 12/24 or after , [place] [address] ( ins purposes)  Fasting lipid, cmp and eGFr, cBC, tSH and vit D 5 days before next visit  It is important that you exercise regularly at least 30 minutes 5 times a week. If you develop chest pain, have severe difficulty breathing, or feel very tired, stop exercising immediately and seek medical attention   Thanks for choosing [facility], we consider it a privelige to serve you.

## 2022-01-02 ENCOUNTER — Encounter: Payer: Self-pay | Admitting: Family Medicine

## 2022-01-02 LAB — POCT GLYCOSYLATED HEMOGLOBIN (HGB A1C): HbA1c, POC (controlled diabetic range): 5.7 % (ref 0.0–7.0)

## 2022-01-02 NOTE — Assessment & Plan Note (Signed)
Patient educated about the importance of limiting  Carbohydrate intake , the need to commit to daily physical activity for a minimum of 30 minutes , and to commit weight loss. The fact that changes in all these areas will reduce or eliminate all together the development of diabetes is stressed.      Latest Ref Rng & Units 01/02/2022    8:04 AM 12/29/2021    9:18 AM 03/28/2021    8:36 AM 12/26/2020    2:13 PM 01/23/2020    3:40 PM  Diabetic Labs  HbA1c 0.0 - 7.0 % 5.7       Chol 100 - 199 mg/dL  229   213  215   HDL >39 mg/dL  77   86  77   Calc LDL 0 - 99 mg/dL  144   119  130   Triglycerides 0 - 149 mg/dL  46   40  39   Creatinine 0.57 - 1.00 mg/dL  0.89  0.75  0.83  0.78       12/30/2021    4:28 PM 12/30/2021    3:19 PM 04/02/2021    2:03 PM 04/02/2021   12:05 PM 04/01/2021    3:50 PM 04/01/2021    3:16 PM 03/28/2021    8:37 AM  BP/Weight  Systolic BP 470 962 836 629 476 546   Diastolic BP 84 92 78 89 92 91   Wt. (Lbs)  158.12    152.12 150.6  BMI  27.14 kg/m2    26.11 kg/m2 25.85 kg/m2       No data to display          Normalized, great!

## 2022-01-02 NOTE — Assessment & Plan Note (Signed)
Annual exam as documented. Counseling done  re healthy lifestyle involving commitment to 150 minutes exercise per week, heart healthy diet, and attaining healthy weight.The importance of adequate sleep also discussed. Changes in health habits are decided on by the patient with goals and time frames  set for achieving them. Immunization and cancer screening needs are specifically addressed at this visit. 

## 2022-01-02 NOTE — Progress Notes (Signed)
Cindy Weiss     MRN: [id number]      DOB: 1960/07/13  HPI: Patient is in for annual physical exam. No other health concerns are expressed or addressed at the visit. Recent labs,  are reviewed. Immunization is reviewed , and  updated if needed.   PE: BP 130/84   Pulse 75   Ht '5\' 4"'$  (1.626 m)   Wt 158 lb 1.9 oz (71.7 kg)   SpO2 97%   BMI 27.14 kg/m   Pleasant  female, alert and oriented x 3, in no cardio-pulmonary distress. Afebrile. HEENT No facial trauma or asymetry. Sinuses non tender.  Extra occullar muscles intact.. External ears normal, . Neck: supple, no adenopathy,JVD or thyromegaly.No bruits.  Chest: Clear to ascultation bilaterally.No crackles or wheezes. Non tender to palpation   Cardiovascular system; Heart sounds normal,  S1 and  S2 ,no S3.  No murmur, or thrill. Apical beat not displaced Peripheral pulses normal.  Abdomen: Soft, non tender, no organomegaly or masses. No bruits. Bowel sounds normal. No guarding, tenderness or rebound.    Musculoskeletal exam: Full ROM of spine, hips , shoulders and knees. No deformity ,swelling or crepitus noted. No muscle wasting or atrophy.   Neurologic: Cranial nerves 2 to 12 intact. Power, tone ,sensation and reflexes normal throughout. No disturbance in gait. No tremor.  Skin: Intact, no ulceration, erythema , scaling or rash noted. Pigmentation normal throughout  Psych; Normal mood and affect. Judgement and concentration normal   Assessment & Plan:  Annual physical exam Annual exam as documented. Counseling done  re healthy lifestyle involving commitment to 150 minutes exercise per week, heart healthy diet, and attaining healthy weight.The importance of adequate sleep also discussed.  Changes in health habits are decided on by the patient with goals and time frames  set for achieving them. Immunization and cancer screening needs are specifically addressed at this  visit.   Prediabetes Patient educated about the importance of limiting  Carbohydrate intake , the need to commit to daily physical activity for a minimum of 30 minutes , and to commit weight loss. The fact that changes in all these areas will reduce or eliminate all together the development of diabetes is stressed.      Latest Ref Rng & Units 01/02/2022    8:04 AM 12/29/2021    9:18 AM 03/28/2021    8:36 AM 12/26/2020    2:13 PM 01/23/2020    3:40 PM  Diabetic Labs  HbA1c 0.0 - 7.0 % 5.7       Chol 100 - 199 mg/dL  229   213  215   HDL >39 mg/dL  77   86  77   Calc LDL 0 - 99 mg/dL  144   119  130   Triglycerides 0 - 149 mg/dL  46   40  39   Creatinine 0.57 - 1.00 mg/dL  0.89  0.75  0.83  0.78       12/30/2021    4:28 PM 12/30/2021    3:19 PM 04/02/2021    2:03 PM 04/02/2021   12:05 PM 04/01/2021    3:50 PM 04/01/2021    3:16 PM 03/28/2021    8:37 AM  BP/Weight  Systolic BP 016 010 932 355 732 202   Diastolic BP 84 92 78 89 92 91   Wt. (Lbs)  158.12    152.12 150.6  BMI  27.14 kg/m2    26.11 kg/m2 25.85 kg/m2  No data to display          Normalized, great!

## 2022-02-23 ENCOUNTER — Other Ambulatory Visit: Payer: Self-pay | Admitting: Family Medicine

## 2022-03-25 ENCOUNTER — Ambulatory Visit (HOSPITAL_COMMUNITY)
Admission: RE | Admit: 2022-03-25 | Discharge: 2022-03-25 | Disposition: A | Discharge status: Home / Self Care | Payer: 59 | Source: Ambulatory Visit | Attending: Family Medicine | Admitting: Family Medicine

## 2022-03-25 DIAGNOSIS — Z1231 Encounter for screening mammogram for malignant neoplasm of breast: Secondary | ICD-10-CM | POA: Insufficient documentation

## 2022-04-13 ENCOUNTER — Ambulatory Visit: Payer: 59 | Attending: Cardiology | Admitting: Cardiology

## 2022-04-13 ENCOUNTER — Telehealth: Payer: Self-pay | Admitting: Cardiology

## 2022-04-13 ENCOUNTER — Other Ambulatory Visit: Payer: Self-pay | Admitting: Family Medicine

## 2022-04-13 ENCOUNTER — Encounter: Payer: Self-pay | Admitting: Cardiology

## 2022-04-13 ENCOUNTER — Telehealth: Payer: Self-pay | Admitting: Family Medicine

## 2022-04-13 VITALS — BP 128/80 | HR 75 | Ht 64.0 in | Wt 164.2 lb

## 2022-04-13 DIAGNOSIS — I480 Paroxysmal atrial fibrillation: Secondary | ICD-10-CM | POA: Diagnosis not present

## 2022-04-13 DIAGNOSIS — I10 Essential (primary) hypertension: Secondary | ICD-10-CM | POA: Diagnosis not present

## 2022-04-13 MED ORDER — METOPROLOL SUCCINATE ER 25 MG PO TB24: 25.0000 mg | ORAL_TABLET | Freq: Every day | ORAL | 3 refills | Status: DC

## 2022-04-13 NOTE — Addendum Note (Signed)
Addended by: Barbarann Ehlers A on: 04/13/2022 03:39 PM   Modules accepted: Orders

## 2022-04-13 NOTE — Telephone Encounter (Signed)
Patient needs refill on   spironolactone (ALDACTONE) 25 MG tablet

## 2022-04-13 NOTE — Telephone Encounter (Signed)
Will address at apt today

## 2022-04-13 NOTE — Telephone Encounter (Signed)
Refill sent to pharmacy.   

## 2022-04-13 NOTE — Telephone Encounter (Signed)
She has appt with Domenic Polite today... She has ran out of her metoprolol. I stated she could speak with the nurse in [place] and, they could get it filed for her during her appt. She is worried it won't be filled in time today. I told her I would send a msg.Marland Kitchen

## 2022-04-13 NOTE — Patient Instructions (Signed)
Medication Instructions:  Your physician recommends that you continue on your current medications as directed. Please refer to the Current Medication list given to you today.   Labwork: None today  Testing/Procedures: None today  Follow-Up: 1 year  Any Other Special Instructions Will Be Listed Below (If Applicable).  If you need a refill on your cardiac medications before your next appointment, please call your pharmacy.  

## 2022-04-13 NOTE — Progress Notes (Signed)
Cardiology Office Note  Date: 04/13/2022   ID: Cindy Weiss, DOB 1960/07/29, MRN [id number]  PCP:  Fayrene Helper, MD  Cardiologist:  Rozann Lesches, MD Electrophysiologist:  None   Chief Complaint  Patient presents with   Cardiac follow-up    History of Present Illness: Cindy Weiss is a 62 y.o. female last seen in December 2022.  She is here for a follow-up visit.  Reports no unusual shortness of breath or palpitations with activity.  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  I reviewed her medications which are stable and outlined below.  She does not report any spontaneous bleeding problems on Xarelto.  She had lab work in October 2023 which I reviewed.  Today's ECG shows sinus rhythm with increased voltage.  Past Medical History:  Diagnosis Date   Anemia    Dysrhythmia    Fibroids    Hyperlipidemia    Hypertension    Impaired glucose tolerance    PAF (paroxysmal atrial fibrillation) ([place])    a. diagnosed in 03/2017. Spontaneous conversion to NSR     Current Outpatient Medications  Medication Sig Dispense Refill   fenofibrate 54 MG tablet TAKE 1 TABLET(54 MG) BY MOUTH DAILY 30 tablet 2   metoprolol succinate (TOPROL-XL) 25 MG 24 hr tablet TAKE 1 TABLET BY MOUTH EVERY DAY 30 tablet 11   rivaroxaban (XARELTO) 20 MG TABS tablet TAKE 1 TABLET(20 MG) BY MOUTH DAILY WITH SUPPER 30 tablet 5   spironolactone (ALDACTONE) 25 MG tablet TAKE HALF A TABLET EVERY MORNING FOR BLOOD PRESSURE 15 tablet 4   No current facility-administered medications for this visit.   Allergies:  Eliquis [apixaban] and Pravastatin   ROS: No orthopnea or PND.  Physical Exam: VS:  BP 128/80   Pulse 75   Ht '5\' 4"'$  (1.626 m)   Wt 164 lb 3.2 oz (74.5 kg)   SpO2 100%   BMI 28.18 kg/m , BMI Body mass index is 28.18 kg/m.  Wt Readings from Last 3 Encounters:  04/13/22 164 lb 3.2 oz (74.5 kg)  12/30/21 158 lb 1.9 oz (71.7 kg)  04/01/21 152 lb 1.9 oz (69  kg)    General: Patient appears comfortable at rest. HEENT: Conjunctiva and lids normal. Neck: Supple, no elevated JVP or carotid bruits. Lungs: Clear to auscultation, nonlabored breathing at rest. Cardiac: Regular rate and rhythm, no S3 or significant systolic murmur. Extremities: No pitting edema.  ECG:  An ECG dated 03/21/2021 was personally reviewed today and demonstrated:  Sinus rhythm.  Recent Labwork: 12/29/2021: ALT 24; AST 26; BUN 15; Creatinine, Ser 0.89; Potassium 4.4; Sodium 141     Component Value Date/Time   CHOL 229 (H) 12/29/2021 0918   TRIG 46 12/29/2021 0918   HDL 77 12/29/2021 0918   CHOLHDL 3.0 12/29/2021 0918   CHOLHDL 2.5 12/26/2020 1413   VLDL 8 12/26/2020 1413   LDLCALC 144 (H) 12/29/2021 0918   LDLCALC 137 (H) 01/04/2019 1633    Other Studies Reviewed Today:  Echocardiogram 04/08/2017: - Left ventricle: The cavity size was normal. Wall thickness was    normal. Systolic function was normal. The estimated ejection    fraction was in the range of 60% to 65%. Wall motion was normal;    there were no regional wall motion abnormalities. Left    ventricular diastolic function parameters were normal.  - Mitral valve: There was mild regurgitation.  - Right atrium: Central venous pressure (est): 3 mm Hg.  -  Atrial septum: No defect or patent foramen ovale was identified.  - Tricuspid valve: There was trivial regurgitation.  - Pulmonary arteries: Systolic pressure could not be accurately    estimated.  - Pericardium, extracardiac: There was no pericardial effusion.   Impressions:   - Normal LV wall thickness with LVEF 60-65% and grossly normal    diastolic function. Mild mitral regurgitation. Trivial tricuspid    regurgitation.   Assessment and Plan:  1.  Paroxysmal atrial fibrillation with CHA2DS2-VASc score of 2.  She is doing well with very infrequent and brief palpitations.  ECG shows sinus rhythm today.  Continue Toprol-XL and Xarelto.  I reviewed her  interval lab work.  2.  Essential hypertension, also on Aldactone with follow-up by Dr. Moshe Cipro.  No changes were made today.  Medication Adjustments/Labs and Tests Ordered: Current medicines are reviewed at length with the patient today.  Concerns regarding medicines are outlined above.   Tests Ordered: No orders of the defined types were placed in this encounter.   Medication Changes: No orders of the defined types were placed in this encounter.   Disposition:  Follow up  1 year.  Signed, Satira Sark, MD, [facility] 04/13/2022 3:36 PM    [facility] [address], [place], [place] [address] Phone: [phone]; Fax: [phone]

## 2022-04-15 ENCOUNTER — Other Ambulatory Visit: Payer: Self-pay | Admitting: Family Medicine

## 2022-06-18 ENCOUNTER — Other Ambulatory Visit: Payer: Self-pay | Admitting: Family Medicine

## 2022-06-22 ENCOUNTER — Telehealth: Payer: Self-pay | Admitting: *Deleted

## 2022-06-22 MED ORDER — RIVAROXABAN 20 MG PO TABS: ORAL_TABLET | ORAL | 0 refills | Status: DC

## 2022-06-22 NOTE — Telephone Encounter (Addendum)
-----   Message from Levonne Hubert, LPN sent at [id number]  4:14 PM EDT ----- Regarding: Xarelto Pt in office requesting samples of Xarelto. I told her to come back on Monday for samples.    Sample request for Xarelto received.  Indication: PAF Last office visit: 04/13/22  Myles Gip MD Weight: 74.5kg Age: 62 Scr: 0.89 on 12/29/21 CrCl: 77.08  Based on above findings Xarelto 20mg  daily is the appropriate dose.  OK to give patient samples if available.

## 2022-06-30 ENCOUNTER — Encounter: Payer: Self-pay | Admitting: Family Medicine

## 2022-06-30 ENCOUNTER — Ambulatory Visit (INDEPENDENT_AMBULATORY_CARE_PROVIDER_SITE_OTHER): Payer: 59 | Admitting: Family Medicine

## 2022-06-30 VITALS — BP 144/84 | HR 73 | Ht 64.0 in | Wt 162.1 lb

## 2022-06-30 DIAGNOSIS — E785 Hyperlipidemia, unspecified: Secondary | ICD-10-CM

## 2022-06-30 DIAGNOSIS — I10 Essential (primary) hypertension: Secondary | ICD-10-CM | POA: Diagnosis not present

## 2022-06-30 DIAGNOSIS — Z1231 Encounter for screening mammogram for malignant neoplasm of breast: Secondary | ICD-10-CM

## 2022-06-30 DIAGNOSIS — E663 Overweight: Secondary | ICD-10-CM

## 2022-06-30 DIAGNOSIS — E559 Vitamin D deficiency, unspecified: Secondary | ICD-10-CM

## 2022-06-30 DIAGNOSIS — E7849 Other hyperlipidemia: Secondary | ICD-10-CM

## 2022-06-30 DIAGNOSIS — R7303 Prediabetes: Secondary | ICD-10-CM

## 2022-06-30 MED ORDER — METOPROLOL SUCCINATE ER 25 MG PO TB24: 25.0000 mg | ORAL_TABLET | Freq: Every day | ORAL | 3 refills | Status: DC

## 2022-06-30 MED ORDER — FENOFIBRATE 54 MG PO TABS: 54.0000 mg | ORAL_TABLET | Freq: Every day | ORAL | 11 refills | Status: DC

## 2022-06-30 MED ORDER — SPIRONOLACTONE 25 MG PO TABS: ORAL_TABLET | ORAL | 3 refills | Status: DC

## 2022-06-30 NOTE — Patient Instructions (Signed)
F/U mid May, call if you need me sooner  Dose increase in spironolactone to ONE daily, your blood pressure is too high  Fasting CBC, lipid, cmp and EGFr, TSH , ViT D and HBA1C   3 to 5 days before next appt  It is important that you exercise regularly at least 30 minutes 5 times a week. If you develop chest pain, have severe difficulty breathing, or feel very tired, stop exercising immediately and seek medical attention   Think about what you will eat, plan ahead. Choose " clean, green, fresh or frozen" over canned, processed or packaged foods which are more sugary, salty and fatty. 70 to 75% of food eaten should be vegetables and fruit. Three meals at set times with snacks allowed between meals, but they must be fruit or vegetables. Aim to eat over a 12 hour period , example 7 am to 7 pm, and STOP after  your last meal of the day. Drink water,generally about 64 ounces per day, no other drink is as healthy. Fruit juice is best enjoyed in a healthy way, by EATING the fruit. Thanks for choosing [facility], we consider it a privelige to serve you.

## 2022-07-05 ENCOUNTER — Encounter: Payer: Self-pay | Admitting: Family Medicine

## 2022-07-05 DIAGNOSIS — E559 Vitamin D deficiency, unspecified: Secondary | ICD-10-CM | POA: Insufficient documentation

## 2022-07-05 NOTE — Assessment & Plan Note (Signed)
Updated lab needed at/ before next visit.   
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
## 2022-07-05 NOTE — Assessment & Plan Note (Signed)
Hyperlipidemia:Low fat diet discussed and encouraged.   Lipid Panel  Lab Results  Component Value Date   CHOL 229 (H) 12/29/2021   HDL 77 12/29/2021   LDLCALC 144 (H) 12/29/2021   TRIG 46 12/29/2021   CHOLHDL 3.0 12/29/2021     Updated lab needed at/ before next visit.

## 2022-07-05 NOTE — Assessment & Plan Note (Signed)
  Patient re-educated about  the importance of commitment to a  minimum of 150 minutes of exercise per week as able.  The importance of healthy food choices with portion control discussed, as well as eating regularly and within a 12 hour window most days. The need to choose "clean , green" food 50 to 75% of the time is discussed, as well as to make water the primary drink and set a goal of 64 ounces water daily.       06/30/2022    3:15 PM 04/13/2022    3:01 PM 12/30/2021    3:19 PM  Weight /BMI  Weight 162 lb 1.3 oz 164 lb 3.2 oz 158 lb 1.9 oz  Height 5\' 4"  (1.626 m) 5\' 4"  (1.626 m) 5\' 4"  (1.626 m)  BMI 27.82 kg/m2 28.18 kg/m2 27.14 kg/m2

## 2022-07-05 NOTE — Assessment & Plan Note (Signed)
Elvated at vist , stressed and review shows this is generally the case, med needs to be increased , holding off at this visit but will increase dose if sgin elevated DASH diet and commitment to daily physical activity for a minimum of 30 minutes discussed and encouraged, as a part of hypertension management. The importance of attaining a healthy weight is also discussed.     06/30/2022    3:59 PM 06/30/2022    3:25 PM 06/30/2022    3:15 PM 04/13/2022    3:01 PM 12/30/2021    4:28 PM 12/30/2021    3:19 PM 04/02/2021    2:03 PM  BP/Weight  Systolic BP 144 148 159 128 130 145 115  Diastolic BP 84 105 98 80 84 92 78  Wt. (Lbs)   162.08 164.2  158.12   BMI   27.82 kg/m2 28.18 kg/m2  27.14 kg/m2

## 2022-07-05 NOTE — Assessment & Plan Note (Signed)
Patient educated about the importance of limiting  Carbohydrate intake , the need to commit to daily physical activity for a minimum of 30 minutes , and to commit weight loss. The fact that changes in all these areas will reduce or eliminate all together the development of diabetes is stressed.      Latest Ref Rng & Units 01/02/2022    8:04 AM 12/29/2021    9:18 AM 03/28/2021    8:36 AM 12/26/2020    2:13 PM 01/23/2020    3:40 PM  Diabetic Labs  HbA1c 0.0 - 7.0 % 5.7       Chol 100 - 199 mg/dL  812   751  700   HDL >17 mg/dL  77   86  77   Calc LDL 0 - 99 mg/dL  494   496  759   Triglycerides 0 - 149 mg/dL  46   40  39   Creatinine 0.57 - 1.00 mg/dL  1.63  8.46  6.59  9.35       06/30/2022    3:59 PM 06/30/2022    3:25 PM 06/30/2022    3:15 PM 04/13/2022    3:01 PM 12/30/2021    4:28 PM 12/30/2021    3:19 PM 04/02/2021    2:03 PM  BP/Weight  Systolic BP 144 148 159 128 130 145 115  Diastolic BP 84 105 98 80 84 92 78  Wt. (Lbs)   162.08 164.2  158.12   BMI   27.82 kg/m2 28.18 kg/m2  27.14 kg/m2        No data to display          Updated lab needed at/ before next visit.

## 2022-08-15 LAB — CMP14+EGFR
ALT: 18 IU/L (ref 0–32)
AST: 17 IU/L (ref 0–40)
Albumin/Globulin Ratio: 1.7 (ref 1.2–2.2)
Albumin: 4.5 g/dL (ref 3.9–4.9)
Alkaline Phosphatase: 87 IU/L (ref 44–121)
BUN/Creatinine Ratio: 15 (ref 12–28)
BUN: 14 mg/dL (ref 8–27)
Bilirubin Total: 0.2 mg/dL (ref 0.0–1.2)
CO2: 24 mmol/L (ref 20–29)
Calcium: 10.2 mg/dL (ref 8.7–10.3)
Chloride: 102 mmol/L (ref 96–106)
Creatinine, Ser: 0.96 mg/dL (ref 0.57–1.00)
Globulin, Total: 2.7 g/dL (ref 1.5–4.5)
Glucose: 75 mg/dL (ref 70–99)
Potassium: 4.3 mmol/L (ref 3.5–5.2)
Sodium: 140 mmol/L (ref 134–144)
Total Protein: 7.2 g/dL (ref 6.0–8.5)
eGFR: 67 mL/min/{1.73_m2} (ref >59)

## 2022-08-15 LAB — CBC
Hematocrit: 33.9 % — ABNORMAL LOW (ref 34.0–46.6)
Hemoglobin: 10.7 g/dL — ABNORMAL LOW (ref 11.1–15.9)
MCH: 21.7 pg — ABNORMAL LOW (ref 26.6–33.0)
MCHC: 31.6 g/dL (ref 31.5–35.7)
MCV: 69 fL — ABNORMAL LOW (ref 79–97)
Platelets: 328 10*3/uL (ref 150–450)
RBC: 4.94 x10E6/uL (ref 3.77–5.28)
RDW: 15.6 % — ABNORMAL HIGH (ref 11.7–15.4)
WBC: 8.8 10*3/uL (ref 3.4–10.8)

## 2022-08-15 LAB — HEMOGLOBIN A1C
Est. average glucose Bld gHb Est-mCnc: 128 mg/dL
Hgb A1c MFr Bld: 6.1 % — ABNORMAL HIGH (ref 4.8–5.6)

## 2022-08-15 LAB — LIPID PANEL
Chol/HDL Ratio: 2.8 ratio (ref 0.0–4.4)
Cholesterol, Total: 217 mg/dL — ABNORMAL HIGH (ref 100–199)
HDL: 77 mg/dL (ref >39)
LDL Chol Calc (NIH): 132 mg/dL — ABNORMAL HIGH (ref 0–99)
Triglycerides: 43 mg/dL (ref 0–149)
VLDL Cholesterol Cal: 8 mg/dL (ref 5–40)

## 2022-08-15 LAB — VITAMIN D 25 HYDROXY (VIT D DEFICIENCY, FRACTURES): Vit D, 25-Hydroxy: 31.9 ng/mL (ref 30.0–100.0)

## 2022-08-15 LAB — TSH: TSH: 0.703 u[IU]/mL (ref 0.450–4.500)

## 2022-08-17 ENCOUNTER — Other Ambulatory Visit: Payer: Self-pay

## 2022-08-17 DIAGNOSIS — D649 Anemia, unspecified: Secondary | ICD-10-CM

## 2022-08-18 ENCOUNTER — Ambulatory Visit (INDEPENDENT_AMBULATORY_CARE_PROVIDER_SITE_OTHER): Payer: 59 | Admitting: Family Medicine

## 2022-08-18 ENCOUNTER — Encounter: Payer: Self-pay | Admitting: Family Medicine

## 2022-08-18 VITALS — BP 148/84 | HR 86 | Ht 64.5 in | Wt 157.0 lb

## 2022-08-18 DIAGNOSIS — E782 Mixed hyperlipidemia: Secondary | ICD-10-CM | POA: Diagnosis not present

## 2022-08-18 DIAGNOSIS — E663 Overweight: Secondary | ICD-10-CM

## 2022-08-18 DIAGNOSIS — R7303 Prediabetes: Secondary | ICD-10-CM | POA: Diagnosis not present

## 2022-08-18 DIAGNOSIS — F418 Other specified anxiety disorders: Secondary | ICD-10-CM

## 2022-08-18 DIAGNOSIS — I10 Essential (primary) hypertension: Secondary | ICD-10-CM

## 2022-08-18 NOTE — Patient Instructions (Addendum)
F/U in 8 weeks , re evaluate your blood pressure, call if you need me sooner  PLEASE take both blood pressure pills and your cholesterol tablet together every day at  the same time  Blood sugar slightly high, cut back on sweets and bread, rice and pasta please  I  will refer you to therapy as requested  I sincerely hope that your situation improves  Thanks for choosing [facility], we consider it a privelige to serve you.

## 2022-08-19 LAB — IRON,TIBC AND FERRITIN PANEL
Ferritin: 22 ng/mL (ref 15–150)
Iron Saturation: 19 % (ref 15–55)
Iron: 70 ug/dL (ref 27–139)
Total Iron Binding Capacity: 363 ug/dL (ref 250–450)
UIBC: 293 ug/dL (ref 118–369)

## 2022-08-19 LAB — SPECIMEN STATUS REPORT

## 2022-08-19 LAB — B12 AND FOLATE PANEL
Folate: >20 ng/mL (ref >3.0)
Vitamin B-12: 641 pg/mL (ref 232–1245)

## 2022-08-21 ENCOUNTER — Encounter: Payer: Self-pay | Admitting: Family Medicine

## 2022-08-21 DIAGNOSIS — F418 Other specified anxiety disorders: Secondary | ICD-10-CM | POA: Insufficient documentation

## 2022-08-21 NOTE — Assessment & Plan Note (Signed)
Uncontrolled , meds med increase, pt resisting at visit, stressed and crying also reports inconsistency in taking her meds DASH diet and commitment to daily physical activity for a minimum of 30 minutes discussed and encouraged, as a part of hypertension management. The importance of attaining a healthy weight is also discussed.     08/18/2022    3:41 PM 08/18/2022    3:02 PM 06/30/2022    3:59 PM 06/30/2022    3:25 PM 06/30/2022    3:15 PM 04/13/2022    3:01 PM 12/30/2021    4:28 PM  BP/Weight  Systolic BP 148 144 144 148 159 128 130  Diastolic BP 84 88 84 105 98 80 84  Wt. (Lbs)  157   162.08 164.2   BMI  26.53 kg/m2   27.82 kg/m2 28.18 kg/m2      F/U in 8 weeks

## 2022-08-21 NOTE — Assessment & Plan Note (Signed)
Extreme anxiety because of family stress and pressure along wit great financial challenge, states potentially facing court , and is overwhelmed does not understand chrges against her , I recommend seeking legal counsel, states unable to afford this, a tough situation.Therapy may be of some benefit will need o seek out therapythat is affordabel

## 2022-08-21 NOTE — Assessment & Plan Note (Signed)
  Patient re-educated about  the importance of commitment to a  minimum of 150 minutes of exercise per week as able.  The importance of healthy food choices with portion control discussed, as well as eating regularly and within a 12 hour window most days. The need to choose "clean , green" food 50 to 75% of the time is discussed, as well as to make water the primary drink and set a goal of 64 ounces water daily.       08/18/2022    3:02 PM 06/30/2022    3:15 PM 04/13/2022    3:01 PM  Weight /BMI  Weight 157 lb 162 lb 1.3 oz 164 lb 3.2 oz  Height 5' 4.5" (1.638 m) 5\' 4"  (1.626 m) 5\' 4"  (1.626 m)  BMI 26.53 kg/m2 27.82 kg/m2 28.18 kg/m2    improved

## 2022-08-21 NOTE — Progress Notes (Signed)
Cindy Weiss     MRN: [id number]      DOB: 1960/05/20  Chief Complaint  Patient presents with   Hypertension    Follow up. Patient states she been under stress due to the anniversary of her husbands death.    HPI Cindy Weiss is here for follow up and re-evaluation of chronic medical conditions, medication management and review of any available recent lab and radiology data.  Preventive health is updated, specifically  Cancer screening and Immunization.   C/o excessive stress following her husband's death  5 years ago as in laws have been demanding that sh eeinancially contribute to debt he had left on family property which she says she has no part of and she does not have the $$ to pay  Has upcoming court date , overwhelmed , does not know which way to turn, cannot afford a lawyer Not consistently taking medications, boo pressure elevated, resisting dose increased , attributing this to stress which undoubtedly plays a part , but her BP has been consistently elevated at multiple visits  ROS Denies recent fever or chills. Denies sinus pressure, nasal congestion, ear pain or sore throat. Denies chest congestion, productive cough or wheezing. Denies chest pains, palpitations and leg swelling Denies abdominal pain, nausea, vomiting,diarrhea or constipation.   Denies dysuria, frequency, hesitancy or incontinence. Denies joint pain, swelling and limitation in mobility. Denies headaches, seizures, numbness, or tingling. Denies skin break down or rash.   PE  BP (!) 148/84   Pulse 86   Ht 5' 4.5" (1.638 m)   Wt 157 lb (71.2 kg)   SpO2 98%   BMI 26.53 kg/m   Patient alert and oriented  HEENT: No facial asymmetry, EOMI,     Neck supple .  Chest: Clear to auscultation bilaterally.  CVS: S1, S2 no murmurs, no S3.Regular rate.  ABD: Soft non tender.   Ext: No edema  MS: Adequate ROM spine, shoulders, hips and knees.  Skin: Intact, no ulcerations or rash noted.  Psych: Good  eye contact, Anxious, crying , distraught over her current predicament  CNS: CN 2-12 intact, power,  normal throughout.no focal deficits noted.   Assessment & Plan  HTN (hypertension) Uncontrolled , meds med increase, pt resisting at visit, stressed and crying also reports inconsistency in taking her meds DASH diet and commitment to daily physical activity for a minimum of 30 minutes discussed and encouraged, as a part of hypertension management. The importance of attaining a healthy weight is also discussed.     08/18/2022    3:41 PM 08/18/2022    3:02 PM 06/30/2022    3:59 PM 06/30/2022    3:25 PM 06/30/2022    3:15 PM 04/13/2022    3:01 PM 12/30/2021    4:28 PM  BP/Weight  Systolic BP 148 144 144 148 159 128 130  Diastolic BP 84 88 84 105 98 80 84  Wt. (Lbs)  157   162.08 164.2   BMI  26.53 kg/m2   27.82 kg/m2 28.18 kg/m2      F/U in 8 weeks  Hyperlipidemia Hyperlipidemia:Low fat diet discussed and encouraged.   Lipid Panel  Lab Results  Component Value Date   CHOL 217 (H) 08/14/2022   HDL 77 08/14/2022   LDLCALC 132 (H) 08/14/2022   TRIG 43 08/14/2022   CHOLHDL 2.8 08/14/2022     Neds to reduce fat I diet and take meds consistently  Overweight  Patient re-educated about  the importance of commitment  to a  minimum of 150 minutes of exercise per week as able.  The importance of healthy food choices with portion control discussed, as well as eating regularly and within a 12 hour window most days. The need to choose "clean , green" food 50 to 75% of the time is discussed, as well as to make water the primary drink and set a goal of 64 ounces water daily.       08/18/2022    3:02 PM 06/30/2022    3:15 PM 04/13/2022    3:01 PM  Weight /BMI  Weight 157 lb 162 lb 1.3 oz 164 lb 3.2 oz  Height 5' 4.5" (1.638 m) 5\' 4"  (1.626 m) 5\' 4"  (1.626 m)  BMI 26.53 kg/m2 27.82 kg/m2 28.18 kg/m2    improved  Prediabetes Patient educated about the importance of limiting   Carbohydrate intake , the need to commit to daily physical activity for a minimum of 30 minutes , and to commit weight loss. The fact that changes in all these areas will reduce or eliminate all together the development of diabetes is stressed.      Latest Ref Rng & Units 08/14/2022    4:24 PM 01/02/2022    8:04 AM 12/29/2021    9:18 AM 03/28/2021    8:36 AM 12/26/2020    2:13 PM  Diabetic Labs  HbA1c 4.8 - 5.6 % 6.1  5.7      Chol 100 - 199 mg/dL 161   096   045   HDL >40 mg/dL 77   77   86   Calc LDL 0 - 99 mg/dL 981   191   478   Triglycerides 0 - 149 mg/dL 43   46   40   Creatinine 0.57 - 1.00 mg/dL 2.95   6.21  3.08  6.57       08/18/2022    3:41 PM 08/18/2022    3:02 PM 06/30/2022    3:59 PM 06/30/2022    3:25 PM 06/30/2022    3:15 PM 04/13/2022    3:01 PM 12/30/2021    4:28 PM  BP/Weight  Systolic BP 148 144 144 148 159 128 130  Diastolic BP 84 88 84 105 98 80 84  Wt. (Lbs)  157   162.08 164.2   BMI  26.53 kg/m2   27.82 kg/m2 28.18 kg/m2        No data to display          Slightly deteriorated, need to reduce sweets and cabs  Situational anxiety Extreme anxiety because of family stress and pressure along wit great financial challenge, states potentially facing court , and is overwhelmed does not understand chrges against her , I recommend seeking legal counsel, states unable to afford this, a tough situation.Therapy may be of some benefit will need o seek out therapythat is affordabel

## 2022-08-21 NOTE — Assessment & Plan Note (Signed)
Patient educated about the importance of limiting  Carbohydrate intake , the need to commit to daily physical activity for a minimum of 30 minutes , and to commit weight loss. The fact that changes in all these areas will reduce or eliminate all together the development of diabetes is stressed.      Latest Ref Rng & Units 08/14/2022    4:24 PM 01/02/2022    8:04 AM 12/29/2021    9:18 AM 03/28/2021    8:36 AM 12/26/2020    2:13 PM  Diabetic Labs  HbA1c 4.8 - 5.6 % 6.1  5.7      Chol 100 - 199 mg/dL 161   096   045   HDL >40 mg/dL 77   77   86   Calc LDL 0 - 99 mg/dL 981   191   478   Triglycerides 0 - 149 mg/dL 43   46   40   Creatinine 0.57 - 1.00 mg/dL 2.95   6.21  3.08  6.57       08/18/2022    3:41 PM 08/18/2022    3:02 PM 06/30/2022    3:59 PM 06/30/2022    3:25 PM 06/30/2022    3:15 PM 04/13/2022    3:01 PM 12/30/2021    4:28 PM  BP/Weight  Systolic BP 148 144 144 148 159 128 130  Diastolic BP 84 88 84 105 98 80 84  Wt. (Lbs)  157   162.08 164.2   BMI  26.53 kg/m2   27.82 kg/m2 28.18 kg/m2        No data to display          Slightly deteriorated, need to reduce sweets and cabs

## 2022-08-21 NOTE — Assessment & Plan Note (Signed)
Hyperlipidemia:Low fat diet discussed and encouraged.   Lipid Panel  Lab Results  Component Value Date   CHOL 217 (H) 08/14/2022   HDL 77 08/14/2022   LDLCALC 132 (H) 08/14/2022   TRIG 43 08/14/2022   CHOLHDL 2.8 08/14/2022     Neds to reduce fat I diet and take meds consistently

## 2022-09-19 ENCOUNTER — Other Ambulatory Visit: Payer: Self-pay | Admitting: Cardiology

## 2022-09-21 NOTE — Telephone Encounter (Signed)
Prescription refill request for Xarelto received.  Indication: PAF Last office visit: 04/13/22  Ival Bible MD Weight: 74.5kg Age: 62 Scr: 0.96 on 08/14/22  Epic CrCl: 71.46  Based on above findings Xarelto 20mg  daily is the appropriate dose.  Refill approved.

## 2022-10-13 ENCOUNTER — Encounter: Payer: Self-pay | Admitting: Family Medicine

## 2022-10-13 ENCOUNTER — Telehealth: Payer: Self-pay | Admitting: Family Medicine

## 2022-10-13 ENCOUNTER — Ambulatory Visit (INDEPENDENT_AMBULATORY_CARE_PROVIDER_SITE_OTHER): Payer: 59 | Admitting: Family Medicine

## 2022-10-13 VITALS — BP 124/85 | HR 80 | Ht 64.0 in | Wt 161.1 lb

## 2022-10-13 DIAGNOSIS — E782 Mixed hyperlipidemia: Secondary | ICD-10-CM | POA: Diagnosis not present

## 2022-10-13 DIAGNOSIS — Z1231 Encounter for screening mammogram for malignant neoplasm of breast: Secondary | ICD-10-CM

## 2022-10-13 DIAGNOSIS — R7303 Prediabetes: Secondary | ICD-10-CM

## 2022-10-13 DIAGNOSIS — I10 Essential (primary) hypertension: Secondary | ICD-10-CM | POA: Diagnosis not present

## 2022-10-13 DIAGNOSIS — E559 Vitamin D deficiency, unspecified: Secondary | ICD-10-CM

## 2022-10-13 DIAGNOSIS — E663 Overweight: Secondary | ICD-10-CM

## 2022-10-13 NOTE — Assessment & Plan Note (Addendum)
Rate controlled , remain on xarelto Patient re-educated about  the importance of commitment to a  minimum of 150 minutes of exercise per week as able.  The importance of healthy food choices with portion control discussed, as well as eating regularly and within a 12 hour window most days. The need to choose "clean , green" food 50 to 75% of the time is discussed, as well as to make water the primary drink and set a goal of 64 ounces water daily.       10/13/2022    3:47 PM 08/18/2022    3:02 PM 06/30/2022    3:15 PM  Weight /BMI  Weight 161 lb 1.9 oz 157 lb 162 lb 1.3 oz  Height 5\' 4"  (1.626 m) 5' 4.5" (1.638 m) 5\' 4"  (1.626 m)  BMI 27.66 kg/m2 26.53 kg/m2 27.82 kg/m2

## 2022-10-13 NOTE — Assessment & Plan Note (Signed)
Controlled, no change in medication DASH diet and commitment to daily physical activity for a minimum of 30 minutes discussed and encouraged, as a part of hypertension management. The importance of attaining a healthy weight is also discussed.     10/13/2022    3:47 PM 08/18/2022    3:41 PM 08/18/2022    3:02 PM 06/30/2022    3:59 PM 06/30/2022    3:25 PM 06/30/2022    3:15 PM 04/13/2022    3:01 PM  BP/Weight  Systolic BP 124 148 144 144 148 159 128  Diastolic BP 85 84 88 84 105 98 80  Wt. (Lbs) 161.12  157   162.08 164.2  BMI 27.66 kg/m2  26.53 kg/m2   27.82 kg/m2 28.18 kg/m2

## 2022-10-13 NOTE — Patient Instructions (Addendum)
Annual exam in early November, call if you need me sooner  Fasting lipid, cmp and EGFr , hBA1C, CBC, TSH and vit D 3 to 6 days before next appointment   Please schedule [place] mammogram at checkout  It is important that you exercise regularly at least 30 minutes 5 times a week. If you develop chest pain, have severe difficulty breathing, or feel very tired, stop exercising immediately and seek medical attention  Thanks for choosing [facility], we consider it a privelige to serve you.

## 2022-10-13 NOTE — Assessment & Plan Note (Signed)
Patient educated about the importance of limiting  Carbohydrate intake , the need to commit to daily physical activity for a minimum of 30 minutes , and to commit weight loss. The fact that changes in all these areas will reduce or eliminate all together the development of diabetes is stressed.      Latest Ref Rng & Units 08/14/2022    4:24 PM 01/02/2022    8:04 AM 12/29/2021    9:18 AM 03/28/2021    8:36 AM 12/26/2020    2:13 PM  Diabetic Labs  HbA1c 4.8 - 5.6 % 6.1  5.7      Chol 100 - 199 mg/dL 562   130   865   HDL >78 mg/dL 77   77   86   Calc LDL 0 - 99 mg/dL 469   629   528   Triglycerides 0 - 149 mg/dL 43   46   40   Creatinine 0.57 - 1.00 mg/dL 4.13   2.44  0.10  2.72       10/13/2022    3:47 PM 08/18/2022    3:41 PM 08/18/2022    3:02 PM 06/30/2022    3:59 PM 06/30/2022    3:25 PM 06/30/2022    3:15 PM 04/13/2022    3:01 PM  BP/Weight  Systolic BP 124 148 144 144 148 159 128  Diastolic BP 85 84 88 84 105 98 80  Wt. (Lbs) 161.12  157   162.08 164.2  BMI 27.66 kg/m2  26.53 kg/m2   27.82 kg/m2 28.18 kg/m2       No data to display          Updated lab needed at/ before next visit.

## 2022-10-13 NOTE — Progress Notes (Signed)
Cindy Weiss     MRN: [id number]      DOB: Jul 05, 1960  Chief Complaint  Patient presents with   Follow-up    Follow up    HPI Cindy Weiss is here for follow up and re-evaluation of chronic medical conditions, in particular uncontrolled HTN, medication management and review of any available recent lab and radiology data.  Preventive health is updated, specifically  Cancer screening and Immunization.   Questions or concerns regarding consultations or procedures which the PT has had in the interim are  addressed. The PT denies any adverse reactions to current medications since the last visit. Taking rx as prescribed Reports improved stress level, has given her problems and challenges to God  ROS Denies recent fever or chills. Denies sinus pressure, nasal congestion, ear pain or sore throat. Denies chest congestion, productive cough or wheezing. Denies chest pains, palpitations and leg swelling Denies abdominal pain, nausea, vomiting,diarrhea or constipation.   Denies dysuria, frequency, hesitancy or incontinence. Denies joint pain, swelling and limitation in mobility. Denies headaches, seizures, numbness, or tingling. Denies uncontrolled depression, anxiety or insomnia. Denies skin break down or rash.   PE  BP 124/85 (BP Location: Left Arm, Patient Position: Sitting, Cuff Size: Normal)   Pulse 80   Ht 5\' 4"  (1.626 m)   Wt 161 lb 1.9 oz (73.1 kg)   SpO2 95%   BMI 27.66 kg/m   Patient alert and oriented and in no cardiopulmonary distress.  HEENT: No facial asymmetry, EOMI,     Neck supple .  Chest: Clear to auscultation bilaterally.  CVS: S1, S2 no murmurs, no S3.Regular rate.  ABD: Soft non tender.   Ext: No edema  MS: Adequate ROM spine, shoulders, hips and knees.  Skin: Intact, no ulcerations or rash noted.  Psych: Good eye contact, normal affect. Memory intact not anxious or depressed appearing.  CNS: CN 2-12 intact, power,  normal throughout.no focal  deficits noted.   Assessment & Plan  HTN (hypertension) Controlled, no change in medication DASH diet and commitment to daily physical activity for a minimum of 30 minutes discussed and encouraged, as a part of hypertension management. The importance of attaining a healthy weight is also discussed.     10/13/2022    3:47 PM 08/18/2022    3:41 PM 08/18/2022    3:02 PM 06/30/2022    3:59 PM 06/30/2022    3:25 PM 06/30/2022    3:15 PM 04/13/2022    3:01 PM  BP/Weight  Systolic BP 124 148 144 144 148 159 128  Diastolic BP 85 84 88 84 105 98 80  Wt. (Lbs) 161.12  157   162.08 164.2  BMI 27.66 kg/m2  26.53 kg/m2   27.82 kg/m2 28.18 kg/m2       Hyperlipidemia Hyperlipidemia:Low fat diet discussed and encouraged. Not at goal,  Updated lab needed at/ before next visit.    Lipid Panel  Lab Results  Component Value Date   CHOL 217 (H) 08/14/2022   HDL 77 08/14/2022   LDLCALC 132 (H) 08/14/2022   TRIG 43 08/14/2022   CHOLHDL 2.8 08/14/2022       Overweight Rate controlled , remain on xarelto Patient re-educated about  the importance of commitment to a  minimum of 150 minutes of exercise per week as able.  The importance of healthy food choices with portion control discussed, as well as eating regularly and within a 12 hour window most days. The need to choose "clean , green"  food 50 to 75% of the time is discussed, as well as to make water the primary drink and set a goal of 64 ounces water daily.       10/13/2022    3:47 PM 08/18/2022    3:02 PM 06/30/2022    3:15 PM  Weight /BMI  Weight 161 lb 1.9 oz 157 lb 162 lb 1.3 oz  Height 5\' 4"  (1.626 m) 5' 4.5" (1.638 m) 5\' 4"  (1.626 m)  BMI 27.66 kg/m2 26.53 kg/m2 27.82 kg/m2      Prediabetes Patient educated about the importance of limiting  Carbohydrate intake , the need to commit to daily physical activity for a minimum of 30 minutes , and to commit weight loss. The fact that changes in all these areas will reduce or  eliminate all together the development of diabetes is stressed.      Latest Ref Rng & Units 08/14/2022    4:24 PM 01/02/2022    8:04 AM 12/29/2021    9:18 AM 03/28/2021    8:36 AM 12/26/2020    2:13 PM  Diabetic Labs  HbA1c 4.8 - 5.6 % 6.1  5.7      Chol 100 - 199 mg/dL 161   096   045   HDL >40 mg/dL 77   77   86   Calc LDL 0 - 99 mg/dL 981   191   478   Triglycerides 0 - 149 mg/dL 43   46   40   Creatinine 0.57 - 1.00 mg/dL 2.95   6.21  3.08  6.57       10/13/2022    3:47 PM 08/18/2022    3:41 PM 08/18/2022    3:02 PM 06/30/2022    3:59 PM 06/30/2022    3:25 PM 06/30/2022    3:15 PM 04/13/2022    3:01 PM  BP/Weight  Systolic BP 124 148 144 144 148 159 128  Diastolic BP 85 84 88 84 105 98 80  Wt. (Lbs) 161.12  157   162.08 164.2  BMI 27.66 kg/m2  26.53 kg/m2   27.82 kg/m2 28.18 kg/m2       No data to display          Updated lab needed at/ before next visit.

## 2022-10-13 NOTE — Telephone Encounter (Signed)
Patient is past due on Tdap.  If her insurance covers it please offer in the office if not recommend that she get it done through the health department.  Please follow-up on this

## 2022-10-13 NOTE — Assessment & Plan Note (Signed)
Hyperlipidemia:Low fat diet discussed and encouraged. Not at goal,  Updated lab needed at/ before next visit.    Lipid Panel  Lab Results  Component Value Date   CHOL 217 (H) 08/14/2022   HDL 77 08/14/2022   LDLCALC 132 (H) 08/14/2022   TRIG 43 08/14/2022   CHOLHDL 2.8 08/14/2022

## 2022-10-15 NOTE — Telephone Encounter (Signed)
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

## 2022-10-15 NOTE — Telephone Encounter (Signed)
Patient LVM returning call  ?

## 2022-10-15 NOTE — Telephone Encounter (Signed)
[id number] 

## 2022-10-16 ENCOUNTER — Ambulatory Visit: Payer: 59 | Admitting: Family Medicine

## 2022-10-16 NOTE — Telephone Encounter (Signed)
Patient aware.

## 2022-10-23 ENCOUNTER — Ambulatory Visit (INDEPENDENT_AMBULATORY_CARE_PROVIDER_SITE_OTHER): Payer: 59

## 2022-10-23 DIAGNOSIS — Z23 Encounter for immunization: Secondary | ICD-10-CM | POA: Diagnosis not present

## 2023-01-26 ENCOUNTER — Ambulatory Visit: Payer: 59 | Attending: Cardiology | Admitting: Cardiology

## 2023-01-26 ENCOUNTER — Encounter: Payer: Self-pay | Admitting: Cardiology

## 2023-01-26 VITALS — BP 122/80 | HR 86 | Ht 64.0 in | Wt 162.4 lb

## 2023-01-26 DIAGNOSIS — I480 Paroxysmal atrial fibrillation: Secondary | ICD-10-CM

## 2023-01-26 DIAGNOSIS — I10 Essential (primary) hypertension: Secondary | ICD-10-CM

## 2023-01-26 NOTE — Progress Notes (Signed)
    Cardiology Office Note  Date: 01/26/2023   ID: Cindy Weiss, DOB 04-Sep-1960, MRN [id number]  History of Present Illness: Cindy Weiss is a 62 y.o. female last seen in January.  She is here for a routine visit.  Reports no palpitations or increasing shortness of breath with typical activities.  I reviewed her medications.  Current cardiac regimen includes Xarelto, Toprol-XL, and Aldactone.  She has occasional bruising and had a nosebleed recently, but no major bleeding episodes.  I reviewed her lab work which has been followed by Dr. Lodema Hong.  Physical Exam: VS:  BP 122/80 (BP Location: Right Arm, Patient Position: Sitting, Cuff Size: Normal)   Pulse 86   Ht 5\' 4"  (1.626 m)   Wt 162 lb 6.4 oz (73.7 kg)   SpO2 100%   BMI 27.88 kg/m , BMI Body mass index is 27.88 kg/m.  Wt Readings from Last 3 Encounters:  01/26/23 162 lb 6.4 oz (73.7 kg)  10/13/22 161 lb 1.9 oz (73.1 kg)  08/18/22 157 lb (71.2 kg)    General: Patient appears comfortable at rest. HEENT: Conjunctiva and lids normal. Neck: Supple, no elevated JVP or carotid bruits. Lungs: Clear to auscultation, nonlabored breathing at rest. Cardiac: Regular rate and rhythm, no S3 or significant systolic murmur. Extremities: No pitting edema.  ECG:  An ECG dated 04/13/2022 was personally reviewed today and demonstrated:  Sinus rhythm with increased voltage.  Labwork: 08/14/2022: ALT 18; AST 17; BUN 14; Creatinine, Ser 0.96; Hemoglobin 10.7; Platelets 328; Potassium 4.3; Sodium 140; TSH 0.703     Component Value Date/Time   CHOL 217 (H) 08/14/2022 1624   TRIG 43 08/14/2022 1624   HDL 77 08/14/2022 1624   CHOLHDL 2.8 08/14/2022 1624   CHOLHDL 2.5 12/26/2020 1413   VLDL 8 12/26/2020 1413   LDLCALC 132 (H) 08/14/2022 1624   LDLCALC 137 (H) 01/04/2019 1633   Other Studies Reviewed Today:  No interval cardiac testing for review today.  Assessment and Plan:  1.  Paroxysmal atrial fibrillation with CHA2DS2-VASc score  of 2.  Symptomatically stable on current regimen including Toprol-XL and Xarelto.  I reviewed her lab work which has been followed with Dr. Lodema Hong in the interim.  Continue with current plan.   2.  Primary hypertension.  Blood pressure well-controlled today on Aldactone.  Disposition:  Follow up  6 months.  Signed, Jonelle Sidle, M.D., F.A.C.C. [facility] at [facility]

## 2023-01-26 NOTE — Patient Instructions (Signed)
Medication Instructions:  Your physician recommends that you continue on your current medications as directed. Please refer to the Current Medication list given to you today.   Labwork: None today  Testing/Procedures: None today  Follow-Up: 6 months  Any Other Special Instructions Will Be Listed Below (If Applicable).  If you need a refill on your cardiac medications before your next appointment, please call your pharmacy.  

## 2023-02-16 ENCOUNTER — Encounter: Payer: Self-pay | Admitting: Family Medicine

## 2023-02-16 ENCOUNTER — Ambulatory Visit (INDEPENDENT_AMBULATORY_CARE_PROVIDER_SITE_OTHER): Payer: 59 | Admitting: Family Medicine

## 2023-02-16 VITALS — BP 120/80 | HR 82 | Ht 64.0 in | Wt 162.1 lb

## 2023-02-16 DIAGNOSIS — Z0000 Encounter for general adult medical examination without abnormal findings: Secondary | ICD-10-CM | POA: Insufficient documentation

## 2023-02-16 NOTE — Assessment & Plan Note (Signed)

## 2023-02-16 NOTE — Progress Notes (Signed)
    Cindy Weiss     MRN: [id number]      DOB: [birth year]  Chief Complaint  Patient presents with   Annual Exam    CPE     HPI: Patient is in for annual physical exam. No other health concerns are expressed or addressed at the visit. Recent labs,  are  to be reviewed.when available, she had them today Immunization is reviewed , and  is up to date  PE: BP 120/80   Pulse 82   Ht 5\' 4"  (1.626 m)   Wt 162 lb 1.9 oz (73.5 kg)   SpO2 93%   BMI 27.83 kg/m   Pleasant  female, alert and oriented x 3, in no cardio-pulmonary distress. Afebrile. HEENT No facial trauma or asymetry. Sinuses non tender.  Extra occullar muscles intact.. External ears normal, . Neck: supple, no adenopathy,JVD or thyromegaly.No bruits.  Chest: Clear to ascultation bilaterally.No crackles or wheezes. Non tender to palpation  Breast: Asymptomatic, not examined  Cardiovascular system; Heart sounds normal,  S1 and  S2 ,no S3.  No murmur, or thrill. Apical beat not displaced Peripheral pulses normal.  Abdomen: Soft, non tender, no organomegaly or masses. No bruits. Bowel sounds normal. No guarding, tenderness or rebound.   GU: Asymptomatic, not examined   Musculoskeletal exam: Full ROM of spine, hips , shoulders and knees. No deformity ,swelling or crepitus noted. No muscle wasting or atrophy.   Neurologic: Cranial nerves 2 to 12 intact. Power, tone ,sensation  normal throughout. No disturbance in gait. No tremor.  Skin: Intact, no ulceration, erythema , scaling or rash noted. Pigmentation normal throughout  Psych; Normal mood and affect. Judgement and concentration normal   Assessment & Plan:  Annual physical exam Annual exam as documented. Counseling done  re healthy lifestyle involving commitment to 150 minutes exercise per week, heart healthy diet, and attaining healthy weight.The importance of adequate sleep also discussed. Regular seat belt use and home safety, is also  discussed. Changes in health habits are decided on by the patient with goals and time frames  set for achieving them. Immunization and cancer screening needs are specifically addressed at this visit.

## 2023-02-16 NOTE — Patient Instructions (Addendum)
F/u in 6 monhts, call if you need me sooner  Exam is good  No medication changes   Nurse please cancel order for TSH and vit d that were done this am, keep all the others  Thanks for choosing [facility], we consider it a privelige to serve you.

## 2023-02-16 NOTE — Addendum Note (Signed)
Addended by: Jasper Riling on: 02/16/2023 04:24 PM   Modules accepted: Orders

## 2023-02-17 LAB — CMP14+EGFR
ALT: 17 [IU]/L (ref 0–32)
AST: 20 [IU]/L (ref 0–40)
Albumin: 4.4 g/dL (ref 3.9–4.9)
Alkaline Phosphatase: 78 [IU]/L (ref 44–121)
BUN/Creatinine Ratio: 18 (ref 12–28)
BUN: 16 mg/dL (ref 8–27)
Bilirubin Total: <0.2 mg/dL (ref 0.0–1.2)
CO2: 23 mmol/L (ref 20–29)
Calcium: 9.9 mg/dL (ref 8.7–10.3)
Chloride: 106 mmol/L (ref 96–106)
Creatinine, Ser: 0.87 mg/dL (ref 0.57–1.00)
Globulin, Total: 2.7 g/dL (ref 1.5–4.5)
Glucose: 100 mg/dL — ABNORMAL HIGH (ref 70–99)
Potassium: 4.5 mmol/L (ref 3.5–5.2)
Sodium: 141 mmol/L (ref 134–144)
Total Protein: 7.1 g/dL (ref 6.0–8.5)
eGFR: 75 mL/min/{1.73_m2} (ref >59)

## 2023-02-17 LAB — CBC
Hematocrit: 34.7 % (ref 34.0–46.6)
Hemoglobin: 11.1 g/dL (ref 11.1–15.9)
MCH: 22.1 pg — ABNORMAL LOW (ref 26.6–33.0)
MCHC: 32 g/dL (ref 31.5–35.7)
MCV: 69 fL — ABNORMAL LOW (ref 79–97)
Platelets: 327 10*3/uL (ref 150–450)
RBC: 5.02 x10E6/uL (ref 3.77–5.28)
RDW: 14.7 % (ref 11.7–15.4)
WBC: 7.4 10*3/uL (ref 3.4–10.8)

## 2023-02-17 LAB — VITAMIN D 25 HYDROXY (VIT D DEFICIENCY, FRACTURES): Vit D, 25-Hydroxy: 26.8 ng/mL — ABNORMAL LOW (ref 30.0–100.0)

## 2023-02-17 LAB — HEMOGLOBIN A1C
Est. average glucose Bld gHb Est-mCnc: 131 mg/dL
Hgb A1c MFr Bld: 6.2 % — ABNORMAL HIGH (ref 4.8–5.6)

## 2023-02-17 LAB — LIPID PANEL
Chol/HDL Ratio: 2.8 {ratio} (ref 0.0–4.4)
Cholesterol, Total: 223 mg/dL — ABNORMAL HIGH (ref 100–199)
HDL: 80 mg/dL (ref >39)
LDL Chol Calc (NIH): 136 mg/dL — ABNORMAL HIGH (ref 0–99)
Triglycerides: 42 mg/dL (ref 0–149)
VLDL Cholesterol Cal: 7 mg/dL (ref 5–40)

## 2023-02-17 LAB — TSH: TSH: 1.46 u[IU]/mL (ref 0.450–4.500)

## 2023-03-27 ENCOUNTER — Other Ambulatory Visit: Payer: Self-pay | Admitting: Cardiology

## 2023-03-29 ENCOUNTER — Telehealth: Payer: Self-pay | Admitting: Cardiology

## 2023-03-29 ENCOUNTER — Ambulatory Visit (HOSPITAL_COMMUNITY)
Admission: RE | Admit: 2023-03-29 | Discharge: 2023-03-29 | Disposition: A | Discharge status: Home / Self Care | Payer: 59 | Source: Ambulatory Visit | Attending: Family Medicine | Admitting: Family Medicine

## 2023-03-29 DIAGNOSIS — Z1231 Encounter for screening mammogram for malignant neoplasm of breast: Secondary | ICD-10-CM | POA: Diagnosis present

## 2023-03-29 NOTE — Telephone Encounter (Signed)
Called spoke with pt advised Xarelto rx was sent to pharmacy this morning at 8:05am.

## 2023-03-29 NOTE — Telephone Encounter (Signed)
Patient needs refill on Xarelto

## 2023-03-29 NOTE — Telephone Encounter (Signed)
Pt last saw Dr Diona Browner 01/26/23, last labs 02/16/23 Creat 0.87, age 62, weight 73.5kg, CrCl 76.8, based on CrCl pt is on appropriate dosage of Xarelto 20mg  every day for afib.  Will refill rx.

## 2023-04-13 ENCOUNTER — Telehealth: Payer: Self-pay | Admitting: Cardiology

## 2023-04-13 NOTE — Telephone Encounter (Signed)
   Pre-operative Risk Assessment    Patient Name: Cindy Weiss  DOB: Dec 20, 1960 MRN: [id number]      Request for Surgical Clearance    Procedure:  Dental Extraction - Amount of Teeth to be Pulled:  8  Date of Surgery:  Clearance TBD                                 Surgeon:  Dr. Robbi Surgeon's Group or Practice Name:  [facility]  Phone number:  [phone]  Fax number:  [phone]   Type of Clearance Requested:   - Pharmacy:  Hold Apixaban  (Eliquis ) ?   Type of Anesthesia:  Local    Additional requests/questions:    [name]   04/13/2023, 12:55 PM

## 2023-04-14 NOTE — Telephone Encounter (Signed)
 Pharmacy please advise on holding Apixaban  prior to 8 dental extraction scheduled for TBD. Thank you.

## 2023-04-14 NOTE — Telephone Encounter (Signed)
 Patient with diagnosis of afib on Eliquis  for anticoagulation.    Procedure: Dental Extraction - Amount of Teeth to be Pulled:  8  Date of procedure: TBD   CHA2DS2-VASc Score = 2   This indicates a 2.2% annual risk of stroke. The patient's score is based upon: CHF History: 0 HTN History: 1 Diabetes History: 0 Stroke History: 0 Vascular Disease History: 0 Age Score: 0 Gender Score: 1       CrCl 77 mL/min Platelet count 327 K    Per office protocol, patient can hold Eliquis  for 1 days prior to procedure.     **This guidance is not considered finalized until pre-operative APP has relayed final recommendations.**

## 2023-04-14 NOTE — Telephone Encounter (Signed)
 Tried calling patient no answer left a detailed vm to call back

## 2023-04-14 NOTE — Telephone Encounter (Signed)
   Name: Cindy Weiss  DOB: 05/05/1960  MRN: [id number]  Primary Cardiologist: Teddie Favre, MD  Not seen since 01/26/2023  Preoperative team, please contact this patient and set up a phone call appointment for further preoperative risk assessment. Please obtain consent and complete medication review. Thank you for your help.  I confirm that guidance regarding antiplatelet and oral anticoagulation therapy has been completed and, if necessary, noted below.  Per office protocol, patient can hold Eliquis  for 1 days prior to procedure.    I also confirmed the patient resides in the state of [place] . As per [facility] telemedicine laws, the patient must reside in the state in which the provider is licensed.   Friddie Jetty, NP 04/14/2023, 4:23 PM [facility]

## 2023-04-15 NOTE — Telephone Encounter (Signed)
Pt has appt with Dr. Diona Browner 04/21/23 for her 1 yr f/u. Dental procedure is TBD. I will confirm with pre op APP if ok to defer clearance to appt with Dr. Diona Browner as pt is coming in for her 1 yr f/u.

## 2023-04-15 NOTE — Telephone Encounter (Signed)
Patient is returning call.  °

## 2023-04-16 NOTE — Telephone Encounter (Signed)
   Name: Cindy Weiss  DOB: 10/14/1960  MRN: [id number]  Primary Cardiologist: Nona Dell, MD  Chart reviewed as part of pre-operative protocol coverage. The patient has an upcoming visit scheduled with Dr. Diona Browner on 04/21/2023 at which time clearance can be addressed in case there are any issues that would impact surgical recommendations.   I added preop FYI to appointment note so that provider is aware to address at time of outpatient visit.  Per office protocol the cardiology provider should forward their finalized clearance decision and recommendations regarding antiplatelet therapy to the requesting party below.    Per office protocol, patient can hold Eliquis for 1 days prior to procedure.    I will route this message as FYI to requesting party and remove this message from the preop box as separate preop APP input not needed at this time.   Please call with any questions.  Napoleon Form, Leodis Rains, NP  04/16/2023, 9:10 AM

## 2023-04-21 ENCOUNTER — Ambulatory Visit: Payer: No Typology Code available for payment source | Attending: Cardiology | Admitting: Cardiology

## 2023-04-21 ENCOUNTER — Encounter: Payer: Self-pay | Admitting: Cardiology

## 2023-04-21 VITALS — BP 118/78 | HR 70 | Ht 63.0 in | Wt 165.0 lb

## 2023-04-21 DIAGNOSIS — I480 Paroxysmal atrial fibrillation: Secondary | ICD-10-CM

## 2023-04-21 DIAGNOSIS — I10 Essential (primary) hypertension: Secondary | ICD-10-CM | POA: Diagnosis not present

## 2023-04-21 NOTE — Progress Notes (Signed)
    Cardiology Office Note  Date: 04/21/2023   ID: Cindy Weiss, DOB 05/18/1960, MRN [id number]  History of Present Illness: Cindy Weiss is a 63 y.o. female last seen in October 2024.  She is here for a routine visit.  She does not report any palpitations or chest pain, states that she has been taking her medications regularly.  Chart reviewed.  Patient being considered for dental extractions, reportedly total of 8 teeth although not all at one time.  It is requested that she hold anticoagulation for these procedures.  She is currently on Xarelto, would recommend 24-hour hold prior to dental work.  I reviewed the remainder of her medications which are stable.  She had lab work in November 2024 as noted below.  I reviewed her ECG today which shows normal sinus rhythm with increased voltage.  Physical Exam: VS:  BP 118/78   Pulse 70   Ht 5\' 3"  (1.6 m)   Wt 165 lb (74.8 kg)   SpO2 98%   BMI 29.23 kg/m , BMI Body mass index is 29.23 kg/m.  Wt Readings from Last 3 Encounters:  04/21/23 165 lb (74.8 kg)  02/16/23 162 lb 1.9 oz (73.5 kg)  01/26/23 162 lb 6.4 oz (73.7 kg)    General: Patient appears comfortable at rest. HEENT: Conjunctiva and lids normal. Neck: Supple, no elevated JVP or carotid bruits. Lungs: Clear to auscultation, nonlabored breathing at rest. Cardiac: Regular rate and rhythm, no S3 or significant systolic murmur.  ECG:  An ECG dated 04/13/2022 was personally reviewed today and demonstrated:  Sinus rhythm with increased voltage.  Labwork: 02/16/2023: ALT 17; AST 20; BUN 16; Creatinine, Ser 0.87; Hemoglobin 11.1; Platelets 327; Potassium 4.5; Sodium 141; TSH 1.460     Component Value Date/Time   CHOL 223 (H) 02/16/2023 0948   TRIG 42 02/16/2023 0948   HDL 80 02/16/2023 0948   CHOLHDL 2.8 02/16/2023 0948   CHOLHDL 2.5 12/26/2020 1413   VLDL 8 12/26/2020 1413   LDLCALC 136 (H) 02/16/2023 0948   LDLCALC 137 (H) 01/04/2019 1633   Other Studies Reviewed  Today:  No interval cardiac testing for review today.  Assessment and Plan:  1.  Paroxysmal atrial fibrillation with CHA2DS2-VASc score of 2.  Symptomatically stable and in sinus rhythm today.  Plan to continue Toprol-XL and Xarelto as before.  I reviewed her lab work from November 2024.   2.  Primary hypertension.  Blood pressure is well-controlled today.  She is also on Aldactone.  3.  Pending dental extractions as discussed above.  Anticipate Xarelto hold for 24 hours prior to these dental procedures.  Will fax this note to her dentist.  Disposition:  Follow up  6 months.  Signed, Jonelle Sidle, M.D., F.A.C.C. [facility]

## 2023-04-21 NOTE — Patient Instructions (Signed)
Medication Instructions:  Your physician recommends that you continue on your current medications as directed. Please refer to the Current Medication list given to you today.   Labwork: None today  Testing/Procedures: None today  Follow-Up: 6 months  Any Other Special Instructions Will Be Listed Below (If Applicable).  If you need a refill on your cardiac medications before your next appointment, please call your pharmacy.  

## 2023-04-28 ENCOUNTER — Telehealth: Payer: Self-pay | Admitting: Family Medicine

## 2023-04-28 ENCOUNTER — Other Ambulatory Visit: Payer: Self-pay

## 2023-04-28 MED ORDER — SPIRONOLACTONE 25 MG PO TABS: ORAL_TABLET | ORAL | 3 refills | Status: DC

## 2023-04-28 NOTE — Telephone Encounter (Signed)
Medication sent.

## 2023-04-28 NOTE — Telephone Encounter (Signed)
Patient came in says [facility] on [address] did not have her spironolactone (ALDACTONE) 25 MG tablet [[id number]] says she had to switch back to [facility] due to insurance change and went to pick up meds and they only had the other 3. Please advise Thank you

## 2023-05-24 ENCOUNTER — Telehealth: Payer: Self-pay | Admitting: Cardiology

## 2023-05-24 NOTE — Telephone Encounter (Signed)
 Patient came in asking if her bleeding heavily is normal after having 5 teeth extracted. She stopped taking her xarelto or 24 hours prior. She had the teeth extracted Saturday. She resumed xarelto Sunday. No longer bleeding but, concerned because it was big blood clots she is having 3 more pulled this Saturday.

## 2023-05-24 NOTE — Telephone Encounter (Signed)
 I spoke with patient and reassured her that her blood clotted because she had stopped the xarelto. She resumed it 24 hours later as she had no further bleeding.

## 2023-06-18 ENCOUNTER — Telehealth: Payer: Self-pay | Admitting: Family Medicine

## 2023-06-18 NOTE — Telephone Encounter (Signed)
 TB screening /  Staff Health assessment   Noted  Copied Sleeved  Original in PCP box Copy front desk folder

## 2023-06-22 ENCOUNTER — Other Ambulatory Visit: Payer: Self-pay | Admitting: Cardiology

## 2023-06-22 ENCOUNTER — Ambulatory Visit (INDEPENDENT_AMBULATORY_CARE_PROVIDER_SITE_OTHER)

## 2023-06-22 ENCOUNTER — Other Ambulatory Visit: Payer: Self-pay

## 2023-06-22 VITALS — BP 147/91 | HR 74 | Ht 63.0 in | Wt 167.1 lb

## 2023-06-22 DIAGNOSIS — Z111 Encounter for screening for respiratory tuberculosis: Secondary | ICD-10-CM

## 2023-06-22 NOTE — Progress Notes (Signed)
 Pt. Came in to office for TB skin test to be completed for school.   Pt. Vitals were recorded. And pt. Was administered TB skin test in left forearm  Pt. Was advised of arising infection signs and was told to return no later than Thursday 06/24/2023 for results.   Pt has submitted paper work for school confirmation of testing and results which have been left in providers box for completion.   Test information NDC [phone] [id number] EXP 06/28/2025

## 2023-06-24 ENCOUNTER — Ambulatory Visit

## 2023-06-24 LAB — TB SKIN TEST
Induration: 0 mm
TB Skin Test: NEGATIVE

## 2023-06-26 ENCOUNTER — Other Ambulatory Visit: Payer: Self-pay | Admitting: Family Medicine

## 2023-07-01 NOTE — Progress Notes (Signed)
 Pt. Received a negative DX and has received ppr work. For completion.

## 2023-08-17 ENCOUNTER — Ambulatory Visit: Payer: 59 | Admitting: Family Medicine

## 2023-08-17 ENCOUNTER — Encounter: Payer: Self-pay | Admitting: Family Medicine

## 2023-08-17 VITALS — BP 124/82 | HR 78 | Resp 16 | Ht 63.0 in | Wt 165.0 lb

## 2023-08-17 DIAGNOSIS — I10 Essential (primary) hypertension: Secondary | ICD-10-CM

## 2023-08-17 DIAGNOSIS — R7303 Prediabetes: Secondary | ICD-10-CM | POA: Diagnosis not present

## 2023-08-17 DIAGNOSIS — R7989 Other specified abnormal findings of blood chemistry: Secondary | ICD-10-CM | POA: Diagnosis not present

## 2023-08-17 DIAGNOSIS — E782 Mixed hyperlipidemia: Secondary | ICD-10-CM | POA: Diagnosis not present

## 2023-08-17 NOTE — Progress Notes (Signed)
 Cindy Weiss     MRN: [id number]      DOB: 11-04-1960  Chief Complaint  Patient presents with   Hypertension    Follow up    HPI [name] is here for follow up and re-evaluation of chronic medical conditions, medication management and review of any available recent lab and radiology data.  Preventive health is updated, specifically  Cancer screening and Immunization.   Questions or concerns regarding consultations or procedures which the PT has had in the interim are  addressed. The PT denies any adverse reactions to current medications since the last visit.  There are no new concerns.  There are no specific complaints   ROS Denies recent fever or chills. Denies sinus pressure, nasal congestion, ear pain or sore throat. Denies chest congestion, productive cough or wheezing. Denies chest pains, palpitations and leg swelling Denies abdominal pain, nausea, vomiting,diarrhea or constipation.   Denies dysuria, frequency, hesitancy or incontinence. Denies joint pain, swelling and limitation in mobility. Denies headaches, seizures, numbness, or tingling. Denies depression, anxiety or insomnia. Denies skin break down or rash.   PE  BP 124/82   Pulse 78   Resp 16   Ht 5\' 3"  (1.6 m)   Wt 165 lb 0.6 oz (74.9 kg)   SpO2 98%   BMI 29.24 kg/m   Patient alert and oriented and in no cardiopulmonary distress.  HEENT: No facial asymmetry, EOMI,     Neck supple .  Chest: Clear to auscultation bilaterally.  CVS: S1, S2 no murmurs, no S3.Regular rate.  ABD: Soft non tender.   Ext: No edema  MS: Adequate ROM spine, shoulders, hips and knees.  Skin: Intact, no ulcerations or rash noted.  Psych: Good eye contact, normal affect. Memory intact not anxious or depressed appearing.  CNS: CN 2-12 intact, power,  normal throughout.no focal deficits noted.   Assessment & Plan  HTN (hypertension) Controlled, no change in medication DASH diet and commitment to daily physical  activity for a minimum of 30 minutes discussed and encouraged, as a part of hypertension management. The importance of attaining a healthy weight is also discussed.     08/17/2023    4:12 PM 08/17/2023    3:49 PM 06/22/2023    4:59 PM 06/22/2023    4:58 PM 04/21/2023   10:57 AM 02/16/2023    4:18 PM 02/16/2023    3:53 PM  BP/Weight  Systolic BP 124 136 147 159 118 120 138  Diastolic BP 82 87 91 96 78 80 85  Wt. (Lbs)  165.04  167.08 165  162.12  BMI  29.24 kg/m2  29.6 kg/m2 29.23 kg/m2  27.83 kg/m2       Prediabetes Patient educated about the importance of limiting  Carbohydrate intake , the need to commit to daily physical activity for a minimum of 30 minutes , and to commit weight loss. The fact that changes in all these areas will reduce or eliminate all together the development of diabetes is stressed.      Latest Ref Rng & Units 02/16/2023    9:48 AM 08/14/2022    4:24 PM 01/02/2022    8:04 AM 12/29/2021    9:18 AM 03/28/2021    8:36 AM  Diabetic Labs  HbA1c 4.8 - 5.6 % 6.2  6.1  5.7     Chol 100 - 199 mg/dL 409  811   914    HDL >78 mg/dL 80  77   77    Calc  LDL 0 - 99 mg/dL 161  096   045    Triglycerides 0 - 149 mg/dL 42  43   46    Creatinine 0.57 - 1.00 mg/dL 4.09  8.11   9.14  7.82       08/17/2023    4:12 PM 08/17/2023    3:49 PM 06/22/2023    4:59 PM 06/22/2023    4:58 PM 04/21/2023   10:57 AM 02/16/2023    4:18 PM 02/16/2023    3:53 PM  BP/Weight  Systolic BP 124 136 147 159 118 120 138  Diastolic BP 82 87 91 96 78 80 85  Wt. (Lbs)  165.04  167.08 165  162.12  BMI  29.24 kg/m2  29.6 kg/m2 29.23 kg/m2  27.83 kg/m2       No data to display          Updated lab needed at/ before next visit.   Hyperlipidemia Hyperlipidemia:Low fat diet discussed and encouraged.   Lipid Panel  Lab Results  Component Value Date   CHOL 223 (H) 02/16/2023   HDL 80 02/16/2023   LDLCALC 136 (H) 02/16/2023   TRIG 42 02/16/2023   CHOLHDL 2.8 02/16/2023     Updated  lab needed at/ before next visit.

## 2023-08-17 NOTE — Assessment & Plan Note (Signed)
 Controlled, no change in medication DASH diet and commitment to daily physical activity for a minimum of 30 minutes discussed and encouraged, as a part of hypertension management. The importance of attaining a healthy weight is also discussed.     08/17/2023    4:12 PM 08/17/2023    3:49 PM 06/22/2023    4:59 PM 06/22/2023    4:58 PM 04/21/2023   10:57 AM 02/16/2023    4:18 PM 02/16/2023    3:53 PM  BP/Weight  Systolic BP 124 136 147 159 118 120 138  Diastolic BP 82 87 91 96 78 80 85  Wt. (Lbs)  165.04  167.08 165  162.12  BMI  29.24 kg/m2  29.6 kg/m2 29.23 kg/m2  27.83 kg/m2

## 2023-08-17 NOTE — Assessment & Plan Note (Signed)
 Hyperlipidemia:Low fat diet discussed and encouraged.   Lipid Panel  Lab Results  Component Value Date   CHOL 223 (H) 02/16/2023   HDL 80 02/16/2023   LDLCALC 136 (H) 02/16/2023   TRIG 42 02/16/2023   CHOLHDL 2.8 02/16/2023     Updated lab needed at/ before next visit.

## 2023-08-17 NOTE — Assessment & Plan Note (Signed)
 Patient educated about the importance of limiting  Carbohydrate intake , the need to commit to daily physical activity for a minimum of 30 minutes , and to commit weight loss. The fact that changes in all these areas will reduce or eliminate all together the development of diabetes is stressed.      Latest Ref Rng & Units 02/16/2023    9:48 AM 08/14/2022    4:24 PM 01/02/2022    8:04 AM 12/29/2021    9:18 AM 03/28/2021    8:36 AM  Diabetic Labs  HbA1c 4.8 - 5.6 % 6.2  6.1  5.7     Chol 100 - 199 mg/dL 295  621   308    HDL >65 mg/dL 80  77   77    Calc LDL 0 - 99 mg/dL 784  696   295    Triglycerides 0 - 149 mg/dL 42  43   46    Creatinine 0.57 - 1.00 mg/dL 2.84  1.32   4.40  1.02       08/17/2023    4:12 PM 08/17/2023    3:49 PM 06/22/2023    4:59 PM 06/22/2023    4:58 PM 04/21/2023   10:57 AM 02/16/2023    4:18 PM 02/16/2023    3:53 PM  BP/Weight  Systolic BP 124 136 147 159 118 120 138  Diastolic BP 82 87 91 96 78 80 85  Wt. (Lbs)  165.04  167.08 165  162.12  BMI  29.24 kg/m2  29.6 kg/m2 29.23 kg/m2  27.83 kg/m2       No data to display          Updated lab needed at/ before next visit.

## 2023-08-17 NOTE — Patient Instructions (Signed)
 Annual exam 11/20 or after, call if you eed me sooner  Labs today as ordered  Excellent blood pressure  You have gained 8 pounds in the past year, less banana pudding and ice cream  It is important that you exercise regularly at least 30 minutes 5 times a week. If you develop chest pain, have severe difficulty breathing, or feel very tired, stop exercising immediately and seek medical attention    Think about what you will eat, plan ahead. Choose " clean, green, fresh or frozen" over canned, processed or packaged foods which are more sugary, salty and fatty. 70 to 75% of food eaten should be vegetables and fruit. Three meals at set times with snacks allowed between meals, but they must be fruit or vegetables. Aim to eat over a 12 hour period , example 7 am to 7 pm, and STOP after  your last meal of the day. Drink water,generally about 64 ounces per day, no other drink is as healthy. Fruit juice is best enjoyed in a healthy way, by EATING the fruit. Thanks for choosing [facility], we consider it a privelige to serve you.

## 2023-08-18 ENCOUNTER — Telehealth: Payer: Self-pay | Admitting: Family Medicine

## 2023-08-18 ENCOUNTER — Telehealth: Payer: Self-pay

## 2023-08-18 ENCOUNTER — Ambulatory Visit: Payer: Self-pay | Admitting: Family Medicine

## 2023-08-18 LAB — CMP14+EGFR
ALT: 18 IU/L (ref 0–32)
AST: 20 IU/L (ref 0–40)
Albumin: 4.8 g/dL (ref 3.9–4.9)
Alkaline Phosphatase: 74 IU/L (ref 44–121)
BUN/Creatinine Ratio: 10 — ABNORMAL LOW (ref 12–28)
BUN: 10 mg/dL (ref 8–27)
Bilirubin Total: 0.4 mg/dL (ref 0.0–1.2)
CO2: 22 mmol/L (ref 20–29)
Calcium: 10.4 mg/dL — ABNORMAL HIGH (ref 8.7–10.3)
Chloride: 103 mmol/L (ref 96–106)
Creatinine, Ser: 1.02 mg/dL — ABNORMAL HIGH (ref 0.57–1.00)
Globulin, Total: 2.7 g/dL (ref 1.5–4.5)
Glucose: 77 mg/dL (ref 70–99)
Potassium: 4.2 mmol/L (ref 3.5–5.2)
Sodium: 139 mmol/L (ref 134–144)
Total Protein: 7.5 g/dL (ref 6.0–8.5)
eGFR: 62 mL/min/{1.73_m2} (ref >59)

## 2023-08-18 LAB — LIPID PANEL W/O CHOL/HDL RATIO
Cholesterol, Total: 230 mg/dL — ABNORMAL HIGH (ref 100–199)
HDL: 80 mg/dL (ref >39)
LDL Chol Calc (NIH): 142 mg/dL — ABNORMAL HIGH (ref 0–99)
Triglycerides: 47 mg/dL (ref 0–149)
VLDL Cholesterol Cal: 8 mg/dL (ref 5–40)

## 2023-08-18 LAB — HEMOGLOBIN A1C
Est. average glucose Bld gHb Est-mCnc: 123 mg/dL
Hgb A1c MFr Bld: 5.9 % — ABNORMAL HIGH (ref 4.8–5.6)

## 2023-08-18 MED ORDER — EZETIMIBE 10 MG PO TABS: 10.0000 mg | ORAL_TABLET | Freq: Every day | ORAL | 5 refills | Status: DC

## 2023-08-18 NOTE — Addendum Note (Signed)
 Addended by: Towanda Fret on: 08/18/2023 12:44 PM   Modules accepted: Orders

## 2023-08-18 NOTE — Telephone Encounter (Signed)
 See result note.

## 2023-08-18 NOTE — Telephone Encounter (Signed)
 Copied from CRM [id number]. Topic: Clinical - Lab/Test Results >> Aug 18, 2023  9:04 AM Baldomero Bone wrote: Reason for CRM: Patient is returning a call from [facility] regarding lab results. CAL checking to see if Pattie Borders is available. Patient is at work, and cannot hold. Callback number is [phone].

## 2023-08-18 NOTE — Telephone Encounter (Signed)
 Lvm to cb x2

## 2023-08-18 NOTE — Telephone Encounter (Signed)
 Copied from CRM [id number]. Topic: General - Call Back - No Documentation >> Aug 18, 2023  1:22 PM Everlene Hobby D wrote: Returning a call to [facility]

## 2023-08-18 NOTE — Telephone Encounter (Signed)
 To add on to CRM I believe the reason pt is wanting to continue to speak with a nurse is because the labs were read to her by the agent and she has further questions. Thanks

## 2023-08-19 ENCOUNTER — Telehealth: Payer: Self-pay

## 2023-08-19 ENCOUNTER — Ambulatory Visit: Payer: Self-pay

## 2023-08-19 DIAGNOSIS — Z111 Encounter for screening for respiratory tuberculosis: Secondary | ICD-10-CM

## 2023-08-19 NOTE — Telephone Encounter (Signed)
 Copied from CRM [id number]. Topic: Clinical - Lab/Test Results >> Aug 19, 2023 12:31 PM Chrystal Crape R wrote: Pt returning office call however office is on lunch and pt will try to call back as she is also on lunch. She has additional questions about how bad her cholesterol is and questions on ne rx order

## 2023-08-19 NOTE — Telephone Encounter (Signed)
 Lvm to cb

## 2023-08-19 NOTE — Telephone Encounter (Signed)
  Chief Complaint: Lab Results  Disposition: [] ED /[] Urgent Care (no appt availability in office) / [] Appointment(In office/virtual)/ []  [facility]/ [] Home Care/ [] Refused Recommended Disposition /[] [place] [facility]/ [x]  Follow-up with PCP  Additional Notes: WM Is calling in regarding her lab results and if there are alternatives or anything she could do to prevent not having to start taking another medication. The patient states she is currently work until 3-3:30. The patient asks to be contacted regarding this at [phone].   Reason for Disposition . Caller requesting lab results  (Exception: Routine or non-urgent lab result.)  Protocols used: PCP Call - No Triage-A-AH

## 2023-08-19 NOTE — Telephone Encounter (Signed)
 See result note.

## 2023-08-19 NOTE — Addendum Note (Signed)
 Addended by: Towanda Fret on: 08/19/2023 10:07 PM   Modules accepted: Orders

## 2023-08-24 ENCOUNTER — Telehealth: Payer: Self-pay

## 2023-08-24 NOTE — Telephone Encounter (Signed)
 Copied from CRM [id number]. Topic: General - Other >> Aug 24, 2023  1:11 PM [place] B wrote: Reason for CRM: pt called back to see if there are any other alternatives she can do beside taking med for her cholesterol

## 2023-08-24 NOTE — Telephone Encounter (Signed)
 States eating hbits have changed and she is terrified of new  med will follow up later , encouraged her to follow her plan

## 2023-09-23 ENCOUNTER — Other Ambulatory Visit: Payer: Self-pay | Admitting: Family Medicine

## 2023-09-30 ENCOUNTER — Other Ambulatory Visit: Payer: Self-pay | Admitting: Cardiology

## 2023-10-04 NOTE — Telephone Encounter (Signed)
 Prescription refill request for Xarelto  received.  Indication: PAF Last office visit: 04/21/23  GORMAN Sierras MD Weight: 74.8kg Age: 63 Scr: 1.02 on 08/17/23  Epic CrCl: 66.66  Based on above findings Xarelto  20mg  daily is the appropriate dose.  Refill approved.

## 2023-10-25 ENCOUNTER — Ambulatory Visit: Attending: Cardiology | Admitting: Cardiology

## 2023-10-25 ENCOUNTER — Encounter: Payer: Self-pay | Admitting: Cardiology

## 2023-10-25 VITALS — BP 130/80 | HR 70 | Ht 64.5 in | Wt 161.4 lb

## 2023-10-25 DIAGNOSIS — I10 Essential (primary) hypertension: Secondary | ICD-10-CM | POA: Diagnosis not present

## 2023-10-25 DIAGNOSIS — I480 Paroxysmal atrial fibrillation: Secondary | ICD-10-CM | POA: Diagnosis not present

## 2023-10-25 NOTE — Progress Notes (Signed)
    Cardiology Office Note  Date: 10/25/2023   ID: Cindy Weiss, DOB [birth year], MRN [id number]  History of Present Illness: Cindy Weiss is a 63 y.o. female last seen in January.  She is here for a routine visit.  Reports no palpitations or chest pain.  She tries to be consistent with exercise on the weekends, has also been watching her diet and has lost at least 6 pounds since March.  Still following with Dr. Antonetta.  We went over her medications.  She does not report any spontaneous bleeding problems on Xarelto  20 mg daily.  No change in antihypertensive regimen.  I reviewed her interval lab work.  Physical Exam: VS:  BP 130/80 (BP Location: Left Arm, Cuff Size: Normal)   Pulse 70   Ht 5' 4.5 (1.638 m)   Wt 161 lb 6.4 oz (73.2 kg)   SpO2 100%   BMI 27.28 kg/m , BMI Body mass index is 27.28 kg/m.  Wt Readings from Last 3 Encounters:  10/25/23 161 lb 6.4 oz (73.2 kg)  08/17/23 165 lb 0.6 oz (74.9 kg)  06/22/23 167 lb 1.3 oz (75.8 kg)    General: Patient appears comfortable at rest. HEENT: Conjunctiva and lids normal. Neck: Supple, no elevated JVP or carotid bruits. Lungs: Clear to auscultation, nonlabored breathing at rest. Cardiac: Regular rate and rhythm, no S3 or significant systolic murmur. Extremities: No pitting edema.  ECG:  An ECG dated 04/21/2023 was personally reviewed today and demonstrated:  Sinus rhythm with increased voltage.  Labwork: 02/16/2023: Hemoglobin 11.1; Platelets 327; TSH 1.460 08/17/2023: ALT 18; AST 20; BUN 10; Creatinine, Ser 1.02; Potassium 4.2; Sodium 139     Component Value Date/Time   CHOL 230 (H) 08/17/2023 1633   TRIG 47 08/17/2023 1633   HDL 80 08/17/2023 1633   CHOLHDL 2.8 02/16/2023 0948   CHOLHDL 2.5 12/26/2020 1413   VLDL 8 12/26/2020 1413   LDLCALC 142 (H) 08/17/2023 1633   LDLCALC 137 (H) 01/04/2019 1633   Other Studies Reviewed Today:  No interval cardiac testing for review today.  Assessment and Plan:  1.   Paroxysmal atrial fibrillation with CHA2DS2-VASc score of 2.  Asymptomatic, no interval palpitations.  Continue Toprol -XL 25 mg daily and Xarelto  20 mg daily (creatinine clearance of 65).   2.  Primary hypertension.  No change in current regimen including Aldactone  25 mg daily.  Disposition:  Follow up 6 months.  Signed, Jayson JUDITHANN Sierras, M.D., F.A.C.C. [facility] at [facility]

## 2023-10-25 NOTE — Patient Instructions (Signed)
 Medication Instructions:  Your physician recommends that you continue on your current medications as directed. Please refer to the Current Medication list given to you today.   Labwork: None today  Testing/Procedures: None today  Follow-Up: 6 months  Any Other Special Instructions Will Be Listed Below (If Applicable).  If you need a refill on your cardiac medications before your next appointment, please call your pharmacy.

## 2023-12-03 ENCOUNTER — Other Ambulatory Visit: Payer: Self-pay | Admitting: Family Medicine

## 2023-12-27 ENCOUNTER — Other Ambulatory Visit: Payer: Self-pay | Admitting: Family Medicine

## 2024-02-21 ENCOUNTER — Other Ambulatory Visit (HOSPITAL_COMMUNITY): Payer: Self-pay | Admitting: Family Medicine

## 2024-02-21 DIAGNOSIS — Z1231 Encounter for screening mammogram for malignant neoplasm of breast: Secondary | ICD-10-CM

## 2024-03-01 ENCOUNTER — Encounter: Payer: Self-pay | Admitting: Family Medicine

## 2024-03-01 ENCOUNTER — Ambulatory Visit: Admitting: Family Medicine

## 2024-03-01 VITALS — BP 130/82 | HR 72 | Ht 64.0 in | Wt 162.0 lb

## 2024-03-01 DIAGNOSIS — Z0000 Encounter for general adult medical examination without abnormal findings: Secondary | ICD-10-CM | POA: Diagnosis not present

## 2024-03-01 DIAGNOSIS — Z23 Encounter for immunization: Secondary | ICD-10-CM

## 2024-03-01 MED ORDER — RIVAROXABAN 20 MG PO TABS: 20.0000 mg | ORAL_TABLET | Freq: Every day | ORAL | 3 refills | Status: AC

## 2024-03-01 MED ORDER — METOPROLOL SUCCINATE ER 25 MG PO TB24: 25.0000 mg | ORAL_TABLET | Freq: Every day | ORAL | 3 refills | Status: AC

## 2024-03-01 NOTE — Progress Notes (Unsigned)
    Cindy Weiss     MRN: [id number]      DOB: [birth year]  Chief Complaint  Patient presents with   Annual Exam    HPI: Patient is in for annual physical exam. No other health concerns are expressed or addressed at the visit. Recent labs,  are reviewed. Immunization is reviewed , and  updated if needed.   PE: Pleasant  female, alert and oriented x 3, in no cardio-pulmonary distress. Afebrile. HEENT No facial trauma or asymetry. Sinuses non tender.  Extra occullar muscles intact.. External ears normal, . Neck: supple, no adenopathy,JVD or thyromegaly.No bruits.  Chest: Clear to ascultation bilaterally.No crackles or wheezes. Non tender to palpation  Breast: No asymetry,no masses or lumps. No tenderness. No nipple discharge or inversion. No axillary or supraclavicular adenopathy  Cardiovascular system; Heart sounds normal,  S1 and  S2 ,no S3.  No murmur, or thrill. Apical beat not displaced Peripheral pulses normal.  Abdomen: Soft, non tender, no organomegaly or masses. No bruits. Bowel sounds normal. No guarding, tenderness or rebound.   GU: External genitalia normal female genitalia , normal female distribution of hair. No lesions. Urethral meatus normal in size, no  Prolapse, no lesions visibly  Present. Bladder non tender. Vagina pink and moist , with no visible lesions , discharge present . Adequate pelvic support no  cystocele or rectocele noted Cervix pink and appears healthy, no lesions or ulcerations noted, no discharge noted from os Uterus normal size, no adnexal masses, no cervical motion or adnexal tenderness.   Musculoskeletal exam: Full ROM of spine, hips , shoulders and knees. No deformity ,swelling or crepitus noted. No muscle wasting or atrophy.   Neurologic: Cranial nerves 2 to 12 intact. Power, tone ,sensation and reflexes normal throughout. No disturbance in gait. No tremor.  Skin: Intact, no ulceration, erythema , scaling or rash  noted. Pigmentation normal throughout  Psych; Normal mood and affect. Judgement and concentration normal   Assessment & Plan:  No problem-specific Assessment & Plan notes found for this encounter.

## 2024-03-01 NOTE — Patient Instructions (Addendum)
 F/u in 6 months  Pneumonia vaccine today  Nurse pls document flu and covid vaccines pt got at [facility] [address] in [place]  NEED to take medication every day at the same time, this is very important  Labs today that were ordered in May please print and give sheet  It is important that you exercise regularly at least 30 minutes 5 times a week. If you develop chest pain, have severe difficulty breathing, or feel very tired, stop exercising immediately and seek medical attention   Think about what you will eat, plan ahead. Choose  clean, green, fresh or frozen over canned, processed or packaged foods which are more sugary, salty and fatty. 70 to 75% of food eaten should be vegetables and fruit. Three meals at set times with snacks allowed between meals, but they must be fruit or vegetables. Aim to eat over a 12 hour period , example 7 am to 7 pm, and STOP after  your last meal of the day. Drink water,generally about 64 ounces per day, no other drink is as healthy. Fruit juice is best enjoyed in a healthy way, by EATING the fruit.   Thanks for choosing [facility], we consider it a privelige to serve you.

## 2024-03-02 ENCOUNTER — Other Ambulatory Visit: Payer: Self-pay

## 2024-03-02 DIAGNOSIS — R7303 Prediabetes: Secondary | ICD-10-CM

## 2024-03-02 DIAGNOSIS — I10 Essential (primary) hypertension: Secondary | ICD-10-CM

## 2024-03-02 DIAGNOSIS — E782 Mixed hyperlipidemia: Secondary | ICD-10-CM

## 2024-03-02 LAB — CMP14+EGFR
ALT: 21 IU/L (ref 0–32)
AST: 24 IU/L (ref 0–40)
Albumin: 4.8 g/dL (ref 3.9–4.9)
Alkaline Phosphatase: 70 IU/L (ref 49–135)
BUN/Creatinine Ratio: 14 (ref 12–28)
BUN: 13 mg/dL (ref 8–27)
Bilirubin Total: 0.4 mg/dL (ref 0.0–1.2)
CO2: 22 mmol/L (ref 20–29)
Calcium: 10.5 mg/dL — ABNORMAL HIGH (ref 8.7–10.3)
Chloride: 100 mmol/L (ref 96–106)
Creatinine, Ser: 0.95 mg/dL (ref 0.57–1.00)
Globulin, Total: 2.6 g/dL (ref 1.5–4.5)
Glucose: 78 mg/dL (ref 70–99)
Potassium: 4.3 mmol/L (ref 3.5–5.2)
Sodium: 140 mmol/L (ref 134–144)
Total Protein: 7.4 g/dL (ref 6.0–8.5)
eGFR: 67 mL/min/1.73 (ref >59)

## 2024-03-02 LAB — CBC
Hematocrit: 38.1 % (ref 34.0–46.6)
Hemoglobin: 12 g/dL (ref 11.1–15.9)
MCH: 22.5 pg — ABNORMAL LOW (ref 26.6–33.0)
MCHC: 31.5 g/dL (ref 31.5–35.7)
MCV: 72 fL — ABNORMAL LOW (ref 79–97)
Platelets: 293 x10E3/uL (ref 150–450)
RBC: 5.33 x10E6/uL — ABNORMAL HIGH (ref 3.77–5.28)
RDW: 15.2 % (ref 11.7–15.4)
WBC: 6.3 x10E3/uL (ref 3.4–10.8)

## 2024-03-02 LAB — LIPID PANEL W/O CHOL/HDL RATIO
Cholesterol, Total: 217 mg/dL — ABNORMAL HIGH (ref 100–199)
HDL: 93 mg/dL (ref >39)
LDL Chol Calc (NIH): 117 mg/dL — ABNORMAL HIGH (ref 0–99)
Triglycerides: 41 mg/dL (ref 0–149)
VLDL Cholesterol Cal: 7 mg/dL (ref 5–40)

## 2024-03-02 LAB — VITAMIN D 25 HYDROXY (VIT D DEFICIENCY, FRACTURES): Vit D, 25-Hydroxy: 26.8 ng/mL — ABNORMAL LOW (ref 30.0–100.0)

## 2024-03-02 LAB — HEMOGLOBIN A1C
Est. average glucose Bld gHb Est-mCnc: 126 mg/dL
Hgb A1c MFr Bld: 6 % — ABNORMAL HIGH (ref 4.8–5.6)

## 2024-03-02 LAB — TSH: TSH: 1.09 u[IU]/mL (ref 0.450–4.500)

## 2024-03-05 ENCOUNTER — Encounter: Payer: Self-pay | Admitting: Family Medicine

## 2024-03-05 DIAGNOSIS — Z23 Encounter for immunization: Secondary | ICD-10-CM | POA: Insufficient documentation

## 2024-03-05 NOTE — Assessment & Plan Note (Signed)
 After obtaining informed consent, the pneumonia 20  vaccine is  administered , with no adverse effect noted at the time of administration.

## 2024-03-05 NOTE — Assessment & Plan Note (Signed)
 Annual exam as documented. . Immunization and cancer screening needs are specifically addressed at this visit.

## 2024-03-28 ENCOUNTER — Encounter: Payer: Self-pay | Admitting: Cardiology

## 2024-03-28 ENCOUNTER — Ambulatory Visit: Attending: Cardiology | Admitting: Cardiology

## 2024-03-28 VITALS — BP 120/88 | HR 73 | Ht 64.5 in | Wt 163.0 lb

## 2024-03-28 DIAGNOSIS — I480 Paroxysmal atrial fibrillation: Secondary | ICD-10-CM

## 2024-03-28 DIAGNOSIS — I10 Essential (primary) hypertension: Secondary | ICD-10-CM | POA: Diagnosis not present

## 2024-03-28 NOTE — Patient Instructions (Signed)
 Medication Instructions:  Continue all current medications.   Labwork: none  Testing/Procedures: none  Follow-Up: 6 months   Any Other Special Instructions Will Be Listed Below (If Applicable).   If you need a refill on your cardiac medications before your next appointment, please call your pharmacy.

## 2024-03-28 NOTE — Progress Notes (Signed)
"  ° ° °  Cardiology Office Note  Date: 03/28/2024   ID: Munirah, Doerner [birth year], MRN [id number]  History of Present Illness: Cindy Weiss is a 63 y.o. female last seen in July.  She is here for a routine visit.  Reports no significant palpitations or chest discomfort in the interim.  She continues to follow with Dr. Antonetta and remains on stable medical therapy.  She does not report any spontaneous bleeding problems on Xarelto .  I reviewed her most recent lab work.  I reviewed her ECG today which shows sinus rhythm with increased voltage.  Physical Exam: VS:  BP 120/88   Pulse 73   Ht 5' 4.5 (1.638 m)   Wt 163 lb (73.9 kg)   SpO2 100%   BMI 27.55 kg/m , BMI Body mass index is 27.55 kg/m.  Wt Readings from Last 3 Encounters:  03/28/24 163 lb (73.9 kg)  03/01/24 162 lb (73.5 kg)  10/25/23 161 lb 6.4 oz (73.2 kg)    General: Patient appears comfortable at rest. HEENT: Conjunctiva and lids normal. Neck: Supple, no elevated JVP or carotid bruits. Lungs: Clear to auscultation, nonlabored breathing at rest. Cardiac: Regular rate and rhythm, no S3 or significant systolic murmur.  ECG:  An ECG dated 04/21/2023 was personally reviewed today and demonstrated:  Sinus rhythm with increased voltage.  Labwork: 03/01/2024: ALT 21; AST 24; BUN 13; Creatinine, Ser 0.95; Hemoglobin 12.0; Platelets 293; Potassium 4.3; Sodium 140; TSH 1.090     Component Value Date/Time   CHOL 217 (H) 03/01/2024 1547   TRIG 41 03/01/2024 1547   HDL 93 03/01/2024 1547   CHOLHDL 2.8 02/16/2023 0948   CHOLHDL 2.5 12/26/2020 1413   VLDL 8 12/26/2020 1413   LDLCALC 117 (H) 03/01/2024 1547   LDLCALC 137 (H) 01/04/2019 1633   Other Studies Reviewed Today:  No interval cardiac testing for review today.  Assessment and Plan:  1.  Paroxysmal atrial fibrillation with CHA2DS2-VASc score of 2.  She is symptomatically stable on current regimen and in sinus rhythm by ECG today.  Continue Toprol -XL 25 mg daily  and Xarelto  20 mg daily.   2.  Primary hypertension.  Continue Aldactone  25 mg daily.  Disposition:  Follow up 6 months.  Signed, Jayson JUDITHANN Sierras, M.D., F.A.C.C.  [facility] at [facility]

## 2024-03-29 ENCOUNTER — Encounter (HOSPITAL_COMMUNITY): Payer: Self-pay

## 2024-03-29 ENCOUNTER — Ambulatory Visit (HOSPITAL_COMMUNITY)
Admission: RE | Admit: 2024-03-29 | Discharge: 2024-03-29 | Disposition: A | Discharge status: Home / Self Care | Source: Ambulatory Visit | Attending: Family Medicine | Admitting: Family Medicine

## 2024-03-29 DIAGNOSIS — Z1231 Encounter for screening mammogram for malignant neoplasm of breast: Secondary | ICD-10-CM | POA: Diagnosis present

## 2024-08-30 ENCOUNTER — Ambulatory Visit: Admitting: Family Medicine

## 2024-09-04 ENCOUNTER — Ambulatory Visit: Admitting: Cardiology
